# Patient Record
Sex: Female | Born: 1955 | Race: White | Hispanic: No | Smoking: Never smoker
Health system: Southern US, Community
[De-identification: ages and names within clinical notes are randomized; demographics above are authoritative.]

## PROBLEM LIST (undated history)

## (undated) DIAGNOSIS — R630 Anorexia: Secondary | ICD-10-CM

## (undated) DIAGNOSIS — F329 Major depressive disorder, single episode, unspecified: Secondary | ICD-10-CM

## (undated) DIAGNOSIS — M25561 Pain in right knee: Secondary | ICD-10-CM

## (undated) DIAGNOSIS — F32A Depression, unspecified: Secondary | ICD-10-CM

## (undated) DIAGNOSIS — F603 Borderline personality disorder: Secondary | ICD-10-CM

## (undated) DIAGNOSIS — H5347 Heteronymous bilateral field defects: Secondary | ICD-10-CM

## (undated) DIAGNOSIS — E785 Hyperlipidemia, unspecified: Secondary | ICD-10-CM

## (undated) DIAGNOSIS — S069XAA Unspecified intracranial injury with loss of consciousness status unknown, initial encounter: Secondary | ICD-10-CM

## (undated) DIAGNOSIS — M810 Age-related osteoporosis without current pathological fracture: Secondary | ICD-10-CM

## (undated) DIAGNOSIS — F502 Bulimia nervosa, unspecified: Secondary | ICD-10-CM

## (undated) DIAGNOSIS — G43109 Migraine with aura, not intractable, without status migrainosus: Secondary | ICD-10-CM

## (undated) DIAGNOSIS — H3552 Pigmentary retinal dystrophy: Secondary | ICD-10-CM

## (undated) DIAGNOSIS — F419 Anxiety disorder, unspecified: Secondary | ICD-10-CM

## (undated) DIAGNOSIS — D649 Anemia, unspecified: Secondary | ICD-10-CM

## (undated) HISTORY — DX: Major depressive disorder, single episode, unspecified: F32.9

## (undated) HISTORY — DX: Pigmentary retinal dystrophy: H35.52

## (undated) HISTORY — DX: Migraine with aura, not intractable, without status migrainosus: G43.109

## (undated) HISTORY — DX: Unspecified intracranial injury with loss of consciousness status unknown, initial encounter: S06.9XAA

## (undated) HISTORY — DX: Hyperlipidemia, unspecified: E78.5

## (undated) HISTORY — DX: Anxiety disorder, unspecified: F41.9

## (undated) HISTORY — DX: Pain in right knee: M25.561

## (undated) HISTORY — DX: Heteronymous bilateral field defects: H53.47

## (undated) HISTORY — DX: Anemia, unspecified: D64.9

## (undated) HISTORY — DX: Age-related osteoporosis without current pathological fracture: M81.0

## (undated) HISTORY — DX: Borderline personality disorder: F60.3

## (undated) HISTORY — DX: Depression, unspecified: F32.A

---

## 2011-08-18 ENCOUNTER — Ambulatory Visit: Payer: 59

## 2011-08-18 ENCOUNTER — Ambulatory Visit (INDEPENDENT_AMBULATORY_CARE_PROVIDER_SITE_OTHER): Payer: 59 | Admitting: Emergency Medicine

## 2011-08-18 VITALS — BP 116/70 | HR 58 | Temp 97.6°F | Resp 18 | Ht 63.25 in | Wt 105.0 lb

## 2011-08-18 DIAGNOSIS — Z8659 Personal history of other mental and behavioral disorders: Secondary | ICD-10-CM

## 2011-08-18 DIAGNOSIS — IMO0001 Reserved for inherently not codable concepts without codable children: Secondary | ICD-10-CM

## 2011-08-18 DIAGNOSIS — R350 Frequency of micturition: Secondary | ICD-10-CM

## 2011-08-18 DIAGNOSIS — M549 Dorsalgia, unspecified: Secondary | ICD-10-CM

## 2011-08-18 LAB — POCT URINALYSIS DIPSTICK
Bilirubin, UA: NEGATIVE
Glucose, UA: NEGATIVE
Ketones, UA: NEGATIVE
Leukocytes, UA: NEGATIVE
Nitrite, UA: NEGATIVE
Protein, UA: NEGATIVE
Spec Grav, UA: 1.01
Urobilinogen, UA: 0.2
pH, UA: 6

## 2011-08-18 LAB — POCT UA - MICROSCOPIC ONLY
Bacteria, U Microscopic: NEGATIVE
Casts, Ur, LPF, POC: NEGATIVE
Crystals, Ur, HPF, POC: NEGATIVE
Epithelial cells, urine per micros: NEGATIVE
Mucus, UA: 1.01
RBC, urine, microscopic: NEGATIVE
WBC, Ur, HPF, POC: NEGATIVE
Yeast, UA: NEGATIVE

## 2011-08-18 MED ORDER — CYCLOBENZAPRINE HCL 5 MG PO TABS: 5.0000 mg | ORAL_TABLET | Freq: Three times a day (TID) | ORAL | Status: AC | PRN

## 2011-08-18 MED ORDER — MELOXICAM 7.5 MG PO TABS: 7.5000 mg | ORAL_TABLET | Freq: Every day | ORAL | Status: AC

## 2011-08-18 NOTE — Progress Notes (Signed)
  Subjective:    Patient ID: [name], female    DOB: 26-Nov-1955, 56 y.o.   MRN: [id number]  Back Pain  Urinary Frequency  Associated symptoms include frequency.   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  Later that week, pt was working with a Psychologist, educational and felt a pop again and has been in severe pain in her back radiating down hips since.   Pt just drove twenty hours to [facility] to take care of her parents.     Review of Systems  Genitourinary: Positive for frequency.  Musculoskeletal: Positive for back pain.       Objective:   Physical Exam  Constitutional: She appears well-developed.  HENT:  Head: Normocephalic.  Eyes: Pupils are equal, round, and reactive to light.  Cardiovascular: Normal rate and regular rhythm.   Pulmonary/Chest: Effort normal and breath sounds normal.  Musculoskeletal:       There is tenderness just above the right SI joint. Straight leg raising is positive on the right at 90. Deep tendon reflexes are 3+ and symmetrical she does not have any focal weakness of the lower extremities. Sensation is intact .  Neurological: She is alert.   [facility] reading (PRIMARY) by  Dr. Cleta Alberts severe osteoporosis no fracture seen no compression fracture.  Results for orders placed in visit on 08/18/11  POCT UA - MICROSCOPIC ONLY      Component Value Range   WBC, Ur, HPF, POC neg     RBC, urine, microscopic neg     Bacteria, U Microscopic neg     Mucus, UA 1.010     Epithelial cells, urine per micros neg     Crystals, Ur, HPF, POC neg     Casts, Ur, LPF, POC neg     Yeast, UA neg    POCT URINALYSIS DIPSTICK      Component Value Range   Color, UA yel.low     Clarity, UA clear     Glucose, UA neg     Bilirubin, UA neg     Ketones, UA neg     Spec Grav, UA 1.010     Blood, UA trace     pH, UA 6.0     Protein, UA neg     Urobilinogen, UA 0.2     Nitrite, UA neg     Leukocytes, UA Negative           Assessment & Plan:    We'll treat as a LS strain. We'll treat with low-dose Flexeril and Mobic 7.5 a day recheck on next Saturday if she is still symptomatic consider MRI.

## 2011-08-21 ENCOUNTER — Telehealth: Payer: Self-pay

## 2011-08-21 DIAGNOSIS — M79604 Pain in right leg: Secondary | ICD-10-CM

## 2011-08-21 DIAGNOSIS — M545 Pain in right leg: Secondary | ICD-10-CM

## 2011-08-21 DIAGNOSIS — S335XXA Sprain of ligaments of lumbar spine, initial encounter: Secondary | ICD-10-CM

## 2011-08-21 DIAGNOSIS — M79605 Pain in left leg: Secondary | ICD-10-CM

## 2011-08-21 NOTE — Telephone Encounter (Signed)
Patient is still having back pain and would like to be referred for an MRI.  She does not want to wait for the follow up in one week,  Can this be done?

## 2011-08-21 NOTE — Telephone Encounter (Signed)
Saw Dr. Cleta Alberts for back pain last week.  Not getting any better and would like referral for an MRI.  Pt  [phone].

## 2011-08-22 NOTE — Telephone Encounter (Signed)
Pt is checking to make sure that the request for the referral of mri of lower back was taken care of.  Referrals sees that it has been reviewed by dr Cleta Alberts but nothing on our workqueue  Best number [phone]

## 2011-08-22 NOTE — Telephone Encounter (Signed)
Please have a referrals schedule an MRI of the LS-spine. Call patient in n you try and get this scheduled for her I need to see her after the MRI is done. Please discuss with patient and be sure she has no contraindications to having an MRI .

## 2011-08-22 NOTE — Telephone Encounter (Signed)
Ordered MRI LS spine per Dr. Cleta Alberts. LMOM notifying PT MRI has been ordered and we will be in touch with her about when it scheduled. Eileen Stanford

## 2011-08-27 ENCOUNTER — Ambulatory Visit
Admission: RE | Admit: 2011-08-27 | Discharge: 2011-08-27 | Disposition: A | Discharge status: Home / Self Care | Payer: 59 | Source: Ambulatory Visit | Attending: Emergency Medicine | Admitting: Emergency Medicine

## 2011-08-27 DIAGNOSIS — M79604 Pain in right leg: Secondary | ICD-10-CM

## 2011-08-27 DIAGNOSIS — S335XXA Sprain of ligaments of lumbar spine, initial encounter: Secondary | ICD-10-CM

## 2011-08-27 DIAGNOSIS — M545 Pain in right leg: Secondary | ICD-10-CM

## 2011-08-27 DIAGNOSIS — M79605 Pain in left leg: Secondary | ICD-10-CM

## 2011-09-14 ENCOUNTER — Telehealth: Payer: Self-pay

## 2011-09-14 NOTE — Telephone Encounter (Signed)
The patient called to request copies of X-rays done 08/18/11.  The patient would like these copies mailed to [address], [place], [place] [address] (her parent's address).  The patient needs these for appt on Tuesday 09/19/11.  Please call patient at [phone] with any questions and when request has been mailed.

## 2011-09-19 ENCOUNTER — Encounter: Payer: Self-pay | Admitting: Internal Medicine

## 2011-09-19 ENCOUNTER — Ambulatory Visit (INDEPENDENT_AMBULATORY_CARE_PROVIDER_SITE_OTHER): Payer: 59 | Admitting: Internal Medicine

## 2011-09-19 VITALS — BP 94/72 | HR 70 | Temp 98.2°F | Ht 63.25 in | Wt 106.0 lb

## 2011-09-19 DIAGNOSIS — Z0000 Encounter for general adult medical examination without abnormal findings: Secondary | ICD-10-CM

## 2011-09-19 DIAGNOSIS — R209 Unspecified disturbances of skin sensation: Secondary | ICD-10-CM

## 2011-09-19 DIAGNOSIS — IMO0001 Reserved for inherently not codable concepts without codable children: Secondary | ICD-10-CM | POA: Insufficient documentation

## 2011-09-19 DIAGNOSIS — Q068 Other specified congenital malformations of spinal cord: Secondary | ICD-10-CM

## 2011-09-19 DIAGNOSIS — R200 Anesthesia of skin: Secondary | ICD-10-CM

## 2011-09-19 DIAGNOSIS — R202 Paresthesia of skin: Secondary | ICD-10-CM | POA: Insufficient documentation

## 2011-09-19 DIAGNOSIS — H3552 Pigmentary retinal dystrophy: Secondary | ICD-10-CM | POA: Insufficient documentation

## 2011-09-19 DIAGNOSIS — M810 Age-related osteoporosis without current pathological fracture: Secondary | ICD-10-CM | POA: Insufficient documentation

## 2011-09-19 DIAGNOSIS — R5383 Other fatigue: Secondary | ICD-10-CM

## 2011-09-19 DIAGNOSIS — R5381 Other malaise: Secondary | ICD-10-CM

## 2011-09-19 DIAGNOSIS — R531 Weakness: Secondary | ICD-10-CM

## 2011-09-19 DIAGNOSIS — E610 Copper deficiency: Secondary | ICD-10-CM | POA: Insufficient documentation

## 2011-09-19 LAB — CBC WITH DIFFERENTIAL/PLATELET
Basophils Absolute: 0 10*3/uL (ref 0.0–0.1)
Basophils Relative: 0.7 % (ref 0.0–3.0)
Eosinophils Absolute: 0 10*3/uL (ref 0.0–0.7)
Eosinophils Relative: 1 % (ref 0.0–5.0)
HCT: 37.4 % (ref 36.0–46.0)
Hemoglobin: 12.5 g/dL (ref 12.0–15.0)
Lymphocytes Relative: 44.7 % (ref 12.0–46.0)
Lymphs Abs: 1.8 10*3/uL (ref 0.7–4.0)
MCHC: 33.5 g/dL (ref 30.0–36.0)
MCV: 93.9 fl (ref 78.0–100.0)
Monocytes Absolute: 0.3 10*3/uL (ref 0.1–1.0)
Monocytes Relative: 7.7 % (ref 3.0–12.0)
Neutro Abs: 1.8 10*3/uL (ref 1.4–7.7)
Neutrophils Relative %: 45.9 % (ref 43.0–77.0)
Platelets: 200 10*3/uL (ref 150.0–400.0)
RBC: 3.98 Mil/uL (ref 3.87–5.11)
RDW: 12.6 % (ref 11.5–14.6)
WBC: 4 10*3/uL — ABNORMAL LOW (ref 4.5–10.5)

## 2011-09-19 LAB — BASIC METABOLIC PANEL
BUN: 10 mg/dL (ref 6–23)
CO2: 31 mEq/L (ref 19–32)
Calcium: 9.2 mg/dL (ref 8.4–10.5)
Chloride: 93 mEq/L — ABNORMAL LOW (ref 96–112)
Creatinine, Ser: 0.7 mg/dL (ref 0.4–1.2)
GFR: 97.89 mL/min (ref >60.00)
Glucose, Bld: 92 mg/dL (ref 70–99)
Potassium: 4 mEq/L (ref 3.5–5.1)
Sodium: 131 mEq/L — ABNORMAL LOW (ref 135–145)

## 2011-09-19 LAB — HEPATIC FUNCTION PANEL
ALT: 20 U/L (ref 0–35)
AST: 26 U/L (ref 0–37)
Albumin: 4.3 g/dL (ref 3.5–5.2)
Alkaline Phosphatase: 46 U/L (ref 39–117)
Bilirubin, Direct: 0 mg/dL (ref 0.0–0.3)
Total Bilirubin: 0.5 mg/dL (ref 0.3–1.2)
Total Protein: 6.9 g/dL (ref 6.0–8.3)

## 2011-09-19 LAB — T4, FREE: Free T4: 0.71 ng/dL (ref 0.60–1.60)

## 2011-09-19 LAB — VITAMIN B12: Vitamin B-12: 583 pg/mL (ref 211–911)

## 2011-09-19 LAB — TSH: TSH: 1.05 u[IU]/mL (ref 0.35–5.50)

## 2011-09-19 NOTE — Assessment & Plan Note (Signed)
The patient has history of retinitis pigmentosa. Refer to a local ophthalmologist for followup.

## 2011-09-19 NOTE — Patient Instructions (Addendum)
Our office will contact you re: blood test resuls

## 2011-09-19 NOTE — Progress Notes (Signed)
Subjective:    Patient ID: [name], female    DOB: 03/04/1955, 56 y.o.   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 Patient moved to [place] in 2007 from [place]. Her parents live in [place] and are patient's of Dr. Cato Mulligan. Patient describes severe illness in 2007. Patient found to have myelodysplasia. She had associated pancytopenia, hypothyroidism and B12 deficiency. She was later referred to hematologist at [facility] clinic. Her symptoms were found to be secondary to severe copper deficiency. She underwent 6 weeks of copper infusion and her symptoms improved.  Over the last 3 weeks, patient has been experiencing somewhat similar symptoms. She describes a heaviness in her arms and legs and tingling in her hands, feet and tongue. She also complains of mental dullness.  Patient is a very fit. She works as a Museum/gallery conservator. She continues to struggle with eating disorder. However her weight is stable.  She is concerned that her physical stamina is getting worse.  She has history of retinitis pigmentosa. This was followed by an ophthalmologist in [place]. She requests referral to ophthalmologist in [place].  She has secondary osteoporosis due to eating disorder. She has been on Fosamax for years. Her last DEXA scan completed in March of 2013. She reports osteoporosis has gotten worse.   Review of Systems   Constitutional: Negative for activity change, appetite change and unexpected weight change.  Eyes: Negative for visual disturbance.  Respiratory: Negative for cough, chest tightness and shortness of breath.   Cardiovascular: Negative for chest pain.  Genitourinary: Negative for difficulty urinating.  Neurological: Negative for headaches.  Gastrointestinal: Negative for abdominal pain, heartburn melena or hematochezia Psych: Negative for depression or  anxiety  Preventative Health - she has never had colonoscopy. She is extremely reluctant to undergo sedation for procedure. She is also overdue for screening mammogram.    No past medical history on file.  History   Social History  . Marital Status: Single    Spouse Name: N/A    Number of Children: N/A  . Years of Education: N/A   Occupational History  . Not on file.   Social History Main Topics  . Smoking status: Never Smoker   . Smokeless tobacco: Not on file  . Alcohol Use: No  . Drug Use: No  . Sexually Active: Not on file   Other Topics Concern  . Not on file   Social History Narrative  . No narrative on file    No past surgical history on file.  No family history on file.  Allergies  Allergen Reactions  . Compazine (Prochlorperazine Edisylate)     Muscle tremors  . Epinephrine Palpitations    Current Outpatient Prescriptions on File Prior to Visit  Medication Sig Dispense Refill  . alendronate (FOSAMAX) 70 MG tablet Take 70 mg by mouth every 7 (seven) days. Take with a full glass of water on an empty stomach. Patient not sure of dose..      . ALPRAZolam (XANAX) 0.25 MG tablet Take 0.25 mg by mouth 2 (two) times daily.      . citalopram (CELEXA) 40 MG tablet Take 40 mg by mouth daily.      Marland Kitchen L-Methylfolate (DEPLIN PO) Take by mouth. Does not know the dose      . meloxicam (MOBIC) 7.5 MG tablet Take 1 tablet (7.5 mg total) by mouth daily.  30 tablet  0  BP 94/72  Pulse 70  Temp 98.2 F (36.8 C) (Oral)  Ht 5' 3.25" (1.607 m)  Wt 106 lb (48.081 kg)  BMI 18.63 kg/m2  LMP 02/18/2004       Objective:   Physical Exam  Constitutional: She is oriented to person, place, and time.       Thin, pleasant 56 year old white female  HENT:  Head: Normocephalic and atraumatic.  Right Ear: External ear normal.  Left Ear: External ear normal.  Mouth/Throat: Oropharynx is clear and moist.  Eyes: Conjunctivae and EOM are normal. Pupils are equal, round, and  reactive to light.  Neck: Neck supple.       No carotid bruit  Cardiovascular: Normal rate, regular rhythm, normal heart sounds and intact distal pulses.   No murmur heard. Pulmonary/Chest: Effort normal and breath sounds normal. She has no wheezes.  Abdominal: Soft. She exhibits no mass. There is tenderness.  Musculoskeletal: She exhibits no edema.  Lymphadenopathy:    She has no cervical adenopathy.  Neurological: She is alert and oriented to person, place, and time. She displays normal reflexes. She exhibits normal muscle tone.       Muscle strength is 5 out of 5 upper and lower extremities.  Patient able to perform multiple squats without difficulty  Skin: Skin is warm and dry.  Psychiatric: She has a normal mood and affect. Her behavior is normal.          Assessment & Plan:

## 2011-09-19 NOTE — Assessment & Plan Note (Signed)
Patient reports severe osteoporosis secondary to eating disorder. Her last DEXA scan was in March of 2013. She is currently taking Fosamax orally. We discussed possibly using Prolia in the future.

## 2011-09-19 NOTE — Assessment & Plan Note (Signed)
56 year old white female with history of severe myelodysplasia secondary competitions he complains of numbness and tingling and generalized weakness. She had similar symptoms when she was first diagnosed with myelodysplasia and 2007. Repeat CBC differential. Check thyroid studies and B12 levels

## 2011-09-20 LAB — VITAMIN D 25 HYDROXY (VIT D DEFICIENCY, FRACTURES): Vit D, 25-Hydroxy: 31 ng/mL (ref 30–89)

## 2011-09-21 ENCOUNTER — Other Ambulatory Visit: Payer: Self-pay | Admitting: Internal Medicine

## 2011-09-21 ENCOUNTER — Telehealth: Payer: Self-pay | Admitting: Internal Medicine

## 2011-09-21 DIAGNOSIS — Z0000 Encounter for general adult medical examination without abnormal findings: Secondary | ICD-10-CM

## 2011-09-21 NOTE — Telephone Encounter (Signed)
Pt would like blood work results °

## 2011-09-21 NOTE — Telephone Encounter (Signed)
See result note.  

## 2011-09-29 ENCOUNTER — Other Ambulatory Visit (INDEPENDENT_AMBULATORY_CARE_PROVIDER_SITE_OTHER): Payer: 59

## 2011-09-29 DIAGNOSIS — E871 Hypo-osmolality and hyponatremia: Secondary | ICD-10-CM

## 2011-09-29 LAB — OSMOLALITY, URINE: Osmolality, Ur: 232 mOsm/kg — ABNORMAL LOW (ref 390–1090)

## 2011-09-29 LAB — OSMOLALITY: Osmolality: 271 mOsm/kg — ABNORMAL LOW (ref 275–300)

## 2011-10-01 LAB — BASIC METABOLIC PANEL
BUN: 10 mg/dL (ref 6–23)
CO2: 31 mEq/L (ref 19–32)
Calcium: 9.4 mg/dL (ref 8.4–10.5)
Chloride: 95 mEq/L — ABNORMAL LOW (ref 96–112)
Creat: 0.68 mg/dL (ref 0.50–1.10)
Glucose, Bld: 72 mg/dL (ref 70–99)
Potassium: 4.3 mEq/L (ref 3.5–5.3)
Sodium: 131 mEq/L — ABNORMAL LOW (ref 135–145)

## 2011-10-02 LAB — FECAL OCCULT BLOOD, IMMUNOCHEMICAL: Fecal Occult Bld: NEGATIVE

## 2011-10-02 NOTE — Addendum Note (Signed)
Addended by: Rita Ohara R on: 10/02/2011 09:18 AM   Modules accepted: Orders

## 2011-10-03 LAB — SODIUM, URINE, RANDOM: Sodium, Ur: 21 mEq/L

## 2011-10-03 LAB — POTASSIUM, URINE, RANDOM: Potassium Urine: 28 mEq/L

## 2011-10-04 ENCOUNTER — Ambulatory Visit: Payer: 59 | Admitting: Internal Medicine

## 2011-10-05 ENCOUNTER — Ambulatory Visit: Payer: 59

## 2011-10-10 ENCOUNTER — Ambulatory Visit: Payer: 59 | Admitting: Internal Medicine

## 2011-10-13 ENCOUNTER — Telehealth: Payer: Self-pay | Admitting: Family Medicine

## 2011-10-13 ENCOUNTER — Ambulatory Visit: Payer: 59

## 2011-10-13 NOTE — Telephone Encounter (Signed)
We received notification that this pt did not complete the chest films ordered 09/29/11.

## 2011-11-13 ENCOUNTER — Telehealth: Payer: Self-pay | Admitting: Internal Medicine

## 2011-11-13 DIAGNOSIS — E871 Hypo-osmolality and hyponatremia: Secondary | ICD-10-CM

## 2011-11-13 NOTE — Telephone Encounter (Signed)
Don't see anything in the office note.  Please advise

## 2011-11-13 NOTE — Telephone Encounter (Signed)
Referral order placed.

## 2011-11-13 NOTE — Telephone Encounter (Signed)
[name], please place order for nephrology consult - Dr. Hyman Hopes  Re: mild hyponatremia.

## 2011-11-13 NOTE — Telephone Encounter (Signed)
Pt called stating that she was suppose to have a referral to Nephrology but was never contacted. There is no referral in system. Pt requesting a referral to Nephrology

## 2011-12-11 ENCOUNTER — Ambulatory Visit (INDEPENDENT_AMBULATORY_CARE_PROVIDER_SITE_OTHER): Payer: 59 | Admitting: Internal Medicine

## 2011-12-11 ENCOUNTER — Encounter: Payer: Self-pay | Admitting: Internal Medicine

## 2011-12-11 VITALS — BP 102/76 | Temp 98.2°F | Wt 106.0 lb

## 2011-12-11 DIAGNOSIS — R1030 Lower abdominal pain, unspecified: Secondary | ICD-10-CM

## 2011-12-11 DIAGNOSIS — R109 Unspecified abdominal pain: Secondary | ICD-10-CM

## 2011-12-11 MED ORDER — HYOSCYAMINE SULFATE 0.125 MG PO TABS: 0.1250 mg | ORAL_TABLET | ORAL | Status: DC | PRN

## 2011-12-11 NOTE — Patient Instructions (Addendum)
Our office will contact you re: referral to Dr. Leone Payor Please call our office if your symptoms do not improve or gets worse.

## 2011-12-11 NOTE — Progress Notes (Signed)
  Subjective:    Patient ID: [name], female    DOB: 09/26/1955, 56 y.o.   MRN: [id number]  HPI  56 year old white female complains of intermittent lower abdominal pain over the last 3-4 days. Patient describes a cramping sensation that radiates to her low back. Patient reports it feels like her previous menstrual cramps. She has also noted urinary frequency but denies dysuria or malodorous urine. She denies fever or chills.  Last night, patient felt like her lower abdomen was significantly bloated. Patient reports this may be related to her history of eating disorder. She consumes 6 heads of lettuce per day. She denies any constipation or diarrhea. She denies melena or blood in her stools.  Review of Systems See HPI  No past medical history on file.  History   Social History  . Marital Status: Single    Spouse Name: N/A    Number of Children: N/A  . Years of Education: N/A   Occupational History  . Not on file.   Social History Main Topics  . Smoking status: Never Smoker   . Smokeless tobacco: Not on file  . Alcohol Use: No  . Drug Use: No  . Sexually Active: Not on file   Other Topics Concern  . Not on file   Social History Narrative  . No narrative on file    No past surgical history on file.  No family history on file.  Allergies  Allergen Reactions  . Compazine (Prochlorperazine Edisylate)     Muscle tremors  . Epinephrine Palpitations    Current Outpatient Prescriptions on File Prior to Visit  Medication Sig Dispense Refill  . alendronate (FOSAMAX) 70 MG tablet Take 70 mg by mouth every 7 (seven) days. Take with a full glass of water on an empty stomach. Patient not sure of dose..      . ALPRAZolam (XANAX) 0.25 MG tablet Take 0.25 mg by mouth 2 (two) times daily.      . citalopram (CELEXA) 40 MG tablet Take 40 mg by mouth daily.      Marland Kitchen L-Methylfolate (DEPLIN PO) Take by mouth. Does not know the dose      . meloxicam (MOBIC) 7.5 MG tablet Take 1  tablet (7.5 mg total) by mouth daily.  30 tablet  0  . hyoscyamine (LEVSIN, ANASPAZ) 0.125 MG tablet Take 1 tablet (0.125 mg total) by mouth every 4 (four) hours as needed for cramping.  30 tablet  0    BP 102/76  Temp 98.2 F (36.8 C) (Oral)  Wt 106 lb (48.081 kg)  LMP 02/18/2004       Objective:   Physical Exam  Constitutional: She is oriented to person, place, and time.       Thin 56 y/o  Cardiovascular: Normal rate, regular rhythm and normal heart sounds.   Pulmonary/Chest: Effort normal and breath sounds normal. She has no wheezes.  Abdominal: Soft. Bowel sounds are normal.       Mild LLQ tenderness,  No mass  Neurological: She is alert and oriented to person, place, and time. No cranial nerve deficit.  Psychiatric: She has a normal mood and affect. Her behavior is normal.          Assessment & Plan:

## 2011-12-11 NOTE — Assessment & Plan Note (Signed)
56 year old white female complains of lower abdominal pain and bloating. She has urinary frequency but her urinalysis is completely normal. I suspect her symptoms are gastrointestinal in nature. She is 56 years old and has not had a colonoscopy. Refer to Dr. Leone Payor for further workup. Obtain CBCD, BMET, and sedimentation rate today. Use Levsin 0.125 mg every 4 hours as needed.  Patient advised to call office if symptoms persist or worsen.

## 2011-12-12 LAB — CBC WITH DIFFERENTIAL/PLATELET
Basophils Absolute: 0 10*3/uL (ref 0.0–0.1)
Basophils Relative: 0.7 % (ref 0.0–3.0)
Eosinophils Absolute: 0.1 10*3/uL (ref 0.0–0.7)
Eosinophils Relative: 1.3 % (ref 0.0–5.0)
HCT: 36 % (ref 36.0–46.0)
Hemoglobin: 12.1 g/dL (ref 12.0–15.0)
Lymphocytes Relative: 46.5 % — ABNORMAL HIGH (ref 12.0–46.0)
Lymphs Abs: 1.9 10*3/uL (ref 0.7–4.0)
MCHC: 33.7 g/dL (ref 30.0–36.0)
MCV: 92.7 fl (ref 78.0–100.0)
Monocytes Absolute: 0.4 10*3/uL (ref 0.1–1.0)
Monocytes Relative: 8.9 % (ref 3.0–12.0)
Neutro Abs: 1.8 10*3/uL (ref 1.4–7.7)
Neutrophils Relative %: 42.6 % — ABNORMAL LOW (ref 43.0–77.0)
Platelets: 194 10*3/uL (ref 150.0–400.0)
RBC: 3.89 Mil/uL (ref 3.87–5.11)
RDW: 12.2 % (ref 11.5–14.6)
WBC: 4.1 10*3/uL — ABNORMAL LOW (ref 4.5–10.5)

## 2011-12-12 LAB — BASIC METABOLIC PANEL
BUN: 15 mg/dL (ref 6–23)
CO2: 31 mEq/L (ref 19–32)
Calcium: 9.1 mg/dL (ref 8.4–10.5)
Chloride: 92 mEq/L — ABNORMAL LOW (ref 96–112)
Creatinine, Ser: 0.7 mg/dL (ref 0.4–1.2)
GFR: 92.92 mL/min (ref >60.00)
Glucose, Bld: 83 mg/dL (ref 70–99)
Potassium: 4.3 mEq/L (ref 3.5–5.1)
Sodium: 129 mEq/L — ABNORMAL LOW (ref 135–145)

## 2011-12-12 LAB — SEDIMENTATION RATE: Sed Rate: 10 mm/hr (ref 0–22)

## 2012-01-16 ENCOUNTER — Other Ambulatory Visit: Payer: Self-pay | Admitting: Internal Medicine

## 2012-01-30 ENCOUNTER — Encounter: Payer: Self-pay | Admitting: Internal Medicine

## 2012-03-11 ENCOUNTER — Other Ambulatory Visit: Payer: Self-pay | Admitting: Nephrology

## 2012-03-11 DIAGNOSIS — I959 Hypotension, unspecified: Secondary | ICD-10-CM

## 2012-03-18 ENCOUNTER — Other Ambulatory Visit: Payer: 59

## 2012-03-30 ENCOUNTER — Other Ambulatory Visit: Payer: Self-pay

## 2012-04-22 ENCOUNTER — Encounter: Payer: Self-pay | Admitting: Family Medicine

## 2012-04-22 ENCOUNTER — Ambulatory Visit (INDEPENDENT_AMBULATORY_CARE_PROVIDER_SITE_OTHER): Payer: 59 | Admitting: Family Medicine

## 2012-04-22 VITALS — BP 102/70 | HR 70 | Temp 99.0°F

## 2012-04-22 DIAGNOSIS — J019 Acute sinusitis, unspecified: Secondary | ICD-10-CM

## 2012-04-22 MED ORDER — AZITHROMYCIN 250 MG PO TABS: ORAL_TABLET | ORAL | Status: DC

## 2012-04-22 NOTE — Progress Notes (Signed)
  Subjective:    Patient ID: [name], female    DOB: [birth year], 57 y.o.   MRN: [id number]  HPI Here for 2 weeks of sinus pressure, HA, ear pressure, and PND. No fever or ST or cough.    Review of Systems  Constitutional: Negative.   HENT: Positive for congestion, postnasal drip and sinus pressure.   Eyes: Negative.   Respiratory: Negative.        Objective:   Physical Exam  Constitutional: She appears well-developed and well-nourished.  HENT:  Right Ear: External ear normal.  Left Ear: External ear normal.  Nose: Nose normal.  Mouth/Throat: Oropharynx is clear and moist. No oropharyngeal exudate.  Eyes: Conjunctivae are normal.  Pulmonary/Chest: Effort normal and breath sounds normal.  Lymphadenopathy:    She has no cervical adenopathy.          Assessment & Plan:  Add Mucinex D prn

## 2012-11-07 ENCOUNTER — Encounter: Payer: Self-pay | Admitting: Family

## 2012-11-07 ENCOUNTER — Ambulatory Visit (INDEPENDENT_AMBULATORY_CARE_PROVIDER_SITE_OTHER): Payer: 59 | Admitting: Family

## 2012-11-07 VITALS — BP 108/70 | HR 59 | Wt 110.0 lb

## 2012-11-07 DIAGNOSIS — Z8659 Personal history of other mental and behavioral disorders: Secondary | ICD-10-CM

## 2012-11-07 DIAGNOSIS — R1084 Generalized abdominal pain: Secondary | ICD-10-CM

## 2012-11-07 DIAGNOSIS — R110 Nausea: Secondary | ICD-10-CM

## 2012-11-07 LAB — POCT URINALYSIS DIPSTICK
Bilirubin, UA: NEGATIVE
Blood, UA: NEGATIVE
Glucose, UA: NEGATIVE
Ketones, UA: NEGATIVE
Leukocytes, UA: NEGATIVE
Nitrite, UA: NEGATIVE
Protein, UA: NEGATIVE
Spec Grav, UA: 1.01
Urobilinogen, UA: 0.2
pH, UA: 6

## 2012-11-07 LAB — CBC WITH DIFFERENTIAL/PLATELET
Basophils Absolute: 0 10*3/uL (ref 0.0–0.1)
Basophils Relative: 0.4 % (ref 0.0–3.0)
Eosinophils Absolute: 0.1 10*3/uL (ref 0.0–0.7)
Eosinophils Relative: 1.6 % (ref 0.0–5.0)
HCT: 38.3 % (ref 36.0–46.0)
Hemoglobin: 13.2 g/dL (ref 12.0–15.0)
Lymphocytes Relative: 27.4 % (ref 12.0–46.0)
Lymphs Abs: 1.4 10*3/uL (ref 0.7–4.0)
MCHC: 34.4 g/dL (ref 30.0–36.0)
MCV: 89.5 fl (ref 78.0–100.0)
Monocytes Absolute: 0.3 10*3/uL (ref 0.1–1.0)
Monocytes Relative: 5.8 % (ref 3.0–12.0)
Neutro Abs: 3.3 10*3/uL (ref 1.4–7.7)
Neutrophils Relative %: 64.8 % (ref 43.0–77.0)
Platelets: 225 10*3/uL (ref 150.0–400.0)
RBC: 4.28 Mil/uL (ref 3.87–5.11)
RDW: 12.9 % (ref 11.5–14.6)
WBC: 5.1 10*3/uL (ref 4.5–10.5)

## 2012-11-07 LAB — BASIC METABOLIC PANEL
BUN: 12 mg/dL (ref 6–23)
CO2: 32 mEq/L (ref 19–32)
Calcium: 9.6 mg/dL (ref 8.4–10.5)
Chloride: 99 mEq/L (ref 96–112)
Creatinine, Ser: 0.7 mg/dL (ref 0.4–1.2)
GFR: 94.2 mL/min (ref >60.00)
Glucose, Bld: 97 mg/dL (ref 70–99)
Potassium: 4.3 mEq/L (ref 3.5–5.1)
Sodium: 136 mEq/L (ref 135–145)

## 2012-11-07 LAB — HEPATIC FUNCTION PANEL
ALT: 25 U/L (ref 0–35)
AST: 23 U/L (ref 0–37)
Albumin: 4.3 g/dL (ref 3.5–5.2)
Alkaline Phosphatase: 55 U/L (ref 39–117)
Bilirubin, Direct: 0 mg/dL (ref 0.0–0.3)
Total Bilirubin: 0.4 mg/dL (ref 0.3–1.2)
Total Protein: 7.1 g/dL (ref 6.0–8.3)

## 2012-11-07 LAB — TSH: TSH: 1.21 u[IU]/mL (ref 0.35–5.50)

## 2012-11-07 NOTE — Patient Instructions (Addendum)
Abdominal Pain (Nonspecific)  Your exam might not show the exact reason you have abdominal pain. Since there are many different causes of abdominal pain, another checkup and more tests may be needed. It is very important to follow up for lasting (persistent) or worsening symptoms. A possible cause of abdominal pain in any person who still has his or her appendix is acute appendicitis. Appendicitis is often hard to diagnose. Normal blood tests, urine tests, ultrasound, and CT scans do not completely rule out early appendicitis or other causes of abdominal pain. Sometimes, only the changes that happen over time will allow appendicitis and other causes of abdominal pain to be determined. Other potential problems that may require surgery may also take time to become more apparent. Because of this, it is important that you follow all of the instructions below.  HOME CARE INSTRUCTIONS    Rest as much as possible.   Do not eat solid food until your pain is gone.   While adults or children have pain: A diet of water, weak decaffeinated tea, broth or bouillon, gelatin, oral rehydration solutions (ORS), frozen ice pops, or ice chips may be helpful.   When pain is gone in adults or children: Start a light diet (dry toast, crackers, applesauce, or white rice). Increase the diet slowly as long as it does not bother you. Eat no dairy products (including cheese and eggs) and no spicy, fatty, fried, or high-fiber foods.   Use no alcohol, caffeine, or cigarettes.   Take your regular medicines unless your caregiver told you not to.   Take any prescribed medicine as directed.   Only take over-the-counter or prescription medicines for pain, discomfort, or fever as directed by your caregiver. Do not give aspirin to children.  If your caregiver has given you a follow-up appointment, it is very important to keep that appointment. Not keeping the appointment could result in a permanent injury and/or lasting (chronic) pain  and/or disability. If there is any problem keeping the appointment, you must call to reschedule.   SEEK IMMEDIATE MEDICAL CARE IF:    Your pain is not gone in 24 hours.   Your pain becomes worse, changes location, or feels different.   You or your child has an oral temperature above 102 F (38.9 C), not controlled by medicine.   Your baby is older than 3 months with a rectal temperature of 102 F (38.9 C) or higher.   Your baby is 3 months old or younger with a rectal temperature of 100.4 F (38 C) or higher.   You have shaking chills.   You keep throwing up (vomiting) or cannot drink liquids.   There is blood in your vomit or you see blood in your bowel movements.   Your bowel movements become dark or black.   You have frequent bowel movements.   Your bowel movements stop (become blocked) or you cannot pass gas.   You have bloody, frequent, or painful urination.   You have yellow discoloration in the skin or whites of the eyes.   Your stomach becomes bloated or bigger.   You have dizziness or fainting.   You have chest or back pain.  MAKE SURE YOU:    Understand these instructions.   Will watch your condition.   Will get help right away if you are not doing well or get worse.  Document Released: 01/30/2005 Document Revised: 04/24/2011 Document Reviewed: 12/28/2008  [employer] Patient Information 2014 [employer].

## 2012-11-07 NOTE — Progress Notes (Signed)
Subjective:    Patient ID: [name], female    DOB: 24-Oct-1955, 57 y.o.   MRN: [id number]  HPI  57 year old white female, nonsmoker, patient of Dr. Marquis Lunch. is in today with complaints of right lower abdominal pain x2 weeks that she described as dull. Rates the pain as 7/10, no worse with movement. Has nausea but no vomiting. Had a similar episode one year ago but unknown etiology. Had a Pap smear 3 months ago that was normal. Patient has a history of eating disorder. She admits to eating 7 lettuces a day, 75 packs of splenda, and lots of chewing gum to maintain her shape. Reports eating less than 620 calories daily. 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 Reports that she's been doing very well and has not had any recent issues. Continues to see a therapist.  Review of Systems  Constitutional: Negative.   HENT: Negative.   Respiratory: Negative.   Cardiovascular: Negative.   Gastrointestinal: Positive for nausea and abdominal pain. Negative for diarrhea and constipation.  Genitourinary: Negative.  Negative for vaginal bleeding and vaginal discharge.  Musculoskeletal: Negative.   Skin: Negative.   Neurological: Negative.   Hematological: Negative.   Psychiatric/Behavioral: Negative.    History reviewed. No pertinent past medical history.  History   Social History  . Marital Status: Single    Spouse Name: N/A    Number of Children: N/A  . Years of Education: N/A   Occupational History  . Not on file.   Social History Main Topics  . Smoking status: Never Smoker   . Smokeless tobacco: Never Used  . Alcohol Use: No  . Drug Use: No  . Sexual Activity: Not on file   Other Topics Concern  . Not on file   Social History Narrative  . No narrative on file    History reviewed. No pertinent past surgical history.  No family history on file.  Allergies  Allergen Reactions  . Compazine [Prochlorperazine Edisylate]     Muscle tremors  . Epinephrine Palpitations     Current Outpatient Prescriptions on File Prior to Visit  Medication Sig Dispense Refill  . alendronate (FOSAMAX) 70 MG tablet Take 70 mg by mouth every 7 (seven) days. Take with a full glass of water on an empty stomach. Patient not sure of dose..      . ALPRAZolam (XANAX) 0.25 MG tablet Take 0.25 mg by mouth 2 (two) times daily.      Marland Kitchen azithromycin (ZITHROMAX) 250 MG tablet As directed  6 tablet  0  . citalopram (CELEXA) 40 MG tablet Take 40 mg by mouth daily.      . hyoscyamine (LEVSIN, ANASPAZ) 0.125 MG tablet Take 1 tablet (0.125 mg total) by mouth every 4 (four) hours as needed for cramping.  30 tablet  0  . L-Methylfolate (DEPLIN PO) Take by mouth. Does not know the dose       No current facility-administered medications on file prior to visit.    BP 108/70  Pulse 59  Wt 110 lb (49.896 kg)  BMI 19.32 kg/m2  SpO2 98%  LMP 01/05/2006chart    Objective:   Physical Exam  Constitutional: She is oriented to person, place, and time. She appears well-developed and well-nourished.  HENT:  Right Ear: External ear normal.  Left Ear: External ear normal.  Nose: Nose normal.  Mouth/Throat: Oropharynx is clear and moist.  Neck: Normal range of motion. Neck supple.  Cardiovascular: Normal rate, regular rhythm and normal heart sounds.  Pulmonary/Chest: Effort normal and breath sounds normal.  Abdominal: Soft. Bowel sounds are normal. She exhibits no distension. There is no tenderness. There is no rebound and no guarding.  Musculoskeletal: Normal range of motion.  Neurological: She is alert and oriented to person, place, and time.  Skin: Skin is warm and dry.  Psychiatric: She has a normal mood and affect.          Assessment & Plan:  Assessment: 1. Generalized abdominal pain 2. Right lower quadrant abdominal pain 3. Nausea 4. History of eating disorder  Plan: Labs sent. Will notify patient pending results. I believe the underlying issue is stress and anxiety.

## 2012-11-11 ENCOUNTER — Telehealth: Payer: Self-pay | Admitting: Internal Medicine

## 2012-11-11 NOTE — Telephone Encounter (Signed)
Pt had blood work on 11-07-12. Pt saw NP. Pt needs blood work results today

## 2012-11-11 NOTE — Telephone Encounter (Signed)
Left message to advise pt labs normal, per [facility]

## 2012-12-19 ENCOUNTER — Other Ambulatory Visit: Payer: Self-pay

## 2013-02-12 ENCOUNTER — Ambulatory Visit: Payer: 59 | Admitting: *Deleted

## 2013-02-24 ENCOUNTER — Ambulatory Visit (INDEPENDENT_AMBULATORY_CARE_PROVIDER_SITE_OTHER): Payer: 59 | Admitting: Internal Medicine

## 2013-02-24 DIAGNOSIS — Z23 Encounter for immunization: Secondary | ICD-10-CM

## 2013-04-11 ENCOUNTER — Ambulatory Visit (INDEPENDENT_AMBULATORY_CARE_PROVIDER_SITE_OTHER): Payer: 59 | Admitting: Family

## 2013-04-11 ENCOUNTER — Encounter: Payer: Self-pay | Admitting: Family

## 2013-04-11 VITALS — BP 90/70 | HR 63 | Temp 98.3°F

## 2013-04-11 DIAGNOSIS — J069 Acute upper respiratory infection, unspecified: Secondary | ICD-10-CM

## 2013-04-11 MED ORDER — FLUTICASONE PROPIONATE 50 MCG/ACT NA SUSP: 2.0000 | Freq: Every day | NASAL | Status: DC

## 2013-04-11 MED ORDER — AZITHROMYCIN 250 MG PO TABS: ORAL_TABLET | ORAL | Status: DC

## 2013-04-11 NOTE — Patient Instructions (Signed)

## 2013-04-11 NOTE — Progress Notes (Signed)
Pre visit review using our clinic review tool, if applicable. No additional management support is needed unless otherwise documented below in the visit note. 

## 2013-04-14 NOTE — Progress Notes (Signed)
Subjective:    Patient ID: [name], female    DOB: [birth year], 58 y.o.   MRN: [id number]  Cough Associated symptoms include a fever and postnasal drip. Pertinent negatives include no shortness of breath or wheezing.    58 year old white female, nonsmoker who presents today with complaints of nausea, congestion, low-grade fever x1 week without relief. Has not taken any over-the-counter medications..  Review of Systems  Constitutional: Positive for fever.  HENT: Positive for congestion and postnasal drip. Negative for sinus pressure.   Respiratory: Positive for cough. Negative for shortness of breath and wheezing.   Cardiovascular: Negative.   Gastrointestinal: Positive for nausea.  Musculoskeletal: Negative.   Skin: Negative.   Allergic/Immunologic: Negative.   Psychiatric/Behavioral: Negative.    History reviewed. No pertinent past medical history.  History   Social History  . Marital Status: Single    Spouse Name: N/A    Number of Children: N/A  . Years of Education: N/A   Occupational History  . Not on file.   Social History Main Topics  . Smoking status: Never Smoker   . Smokeless tobacco: Never Used  . Alcohol Use: No  . Drug Use: No  . Sexual Activity: Not on file   Other Topics Concern  . Not on file   Social History Narrative  . No narrative on file    History reviewed. No pertinent past surgical history.  No family history on file.  Allergies  Allergen Reactions  . Compazine [Prochlorperazine Edisylate]     Muscle tremors  . Epinephrine Palpitations    Current Outpatient Prescriptions on File Prior to Visit  Medication Sig Dispense Refill  . ALPRAZolam (XANAX) 0.25 MG tablet Take 0.25 mg by mouth 2 (two) times daily.      . citalopram (CELEXA) 40 MG tablet Take 40 mg by mouth daily.      Marland Kitchen L-Methylfolate (DEPLIN PO) Take by mouth. Does not know the dose      . alendronate (FOSAMAX) 70 MG tablet Take 70 mg by mouth every 7 (seven)  days. Take with a full glass of water on an empty stomach. Patient not sure of dose..      . hyoscyamine (LEVSIN, ANASPAZ) 0.125 MG tablet Take 1 tablet (0.125 mg total) by mouth every 4 (four) hours as needed for cramping.  30 tablet  0   No current facility-administered medications on file prior to visit.    BP 90/70  Pulse 63  Temp(Src) 98.3 F (36.8 C)  LMP 01/05/2006chart    Objective:   Physical Exam  Constitutional: She is oriented to person, place, and time. She appears well-developed and well-nourished.  HENT:  Right Ear: External ear normal.  Left Ear: External ear normal.  Mouth/Throat: Oropharynx is clear and moist.  Nasal turbinates red and boggy  Neck: Normal range of motion. Neck supple.  Cardiovascular: Normal rate, regular rhythm and normal heart sounds.   Pulmonary/Chest: Effort normal and breath sounds normal.  Neurological: She is alert and oriented to person, place, and time.  Skin: Skin is warm and dry.  Psychiatric: She has a normal mood and affect.          Assessment & Plan:  Nitro was seen today for nasal congestion, cough and nausea.  Diagnoses and associated orders for this visit:  Acute upper respiratory infections of unspecified site  Other Orders - fluticasone (FLONASE) 50 MCG/ACT nasal spray; Place 2 sprays into both nostrils daily. - azithromycin (ZITHROMAX) 250 MG tablet; As  directed    advised only fill the antibiotic if necessary over the weekend. Recheck as scheduled and as needed.

## 2013-05-13 ENCOUNTER — Encounter: Payer: Self-pay | Admitting: Family

## 2013-05-13 ENCOUNTER — Ambulatory Visit (INDEPENDENT_AMBULATORY_CARE_PROVIDER_SITE_OTHER): Payer: 59 | Admitting: Family

## 2013-05-13 ENCOUNTER — Ambulatory Visit (INDEPENDENT_AMBULATORY_CARE_PROVIDER_SITE_OTHER)
Admission: RE | Admit: 2013-05-13 | Discharge: 2013-05-13 | Disposition: A | Discharge status: Home / Self Care | Payer: 59 | Source: Ambulatory Visit | Attending: Family | Admitting: Family

## 2013-05-13 VITALS — BP 98/64 | HR 74

## 2013-05-13 DIAGNOSIS — R071 Chest pain on breathing: Secondary | ICD-10-CM

## 2013-05-13 DIAGNOSIS — R0789 Other chest pain: Secondary | ICD-10-CM

## 2013-05-13 MED ORDER — CYCLOBENZAPRINE HCL 5 MG PO TABS: 5.0000 mg | ORAL_TABLET | Freq: Three times a day (TID) | ORAL | Status: DC | PRN

## 2013-05-13 NOTE — Patient Instructions (Signed)

## 2013-05-13 NOTE — Progress Notes (Signed)
Subjective:    Patient ID: [name], female    DOB: 1955/12/06, 58 y.o.   MRN: [id number]  HPI 58 year old white female, nonsmoker, malnourished female, is in today with c/o left chest wall pain after lifting weights at the gym 60 days ago. Reports having pain 10/10. Tylenol extra strength helps temporarily. Worse with movement. Mild swelling.    Review of Systems  Constitutional: Negative.   Respiratory: Negative.   Cardiovascular: Negative.   Gastrointestinal: Negative.   Endocrine: Negative.   Musculoskeletal: Positive for arthralgias.       Left chest wall   Skin: Negative.   Allergic/Immunologic: Negative.   Neurological: Negative.   Psychiatric/Behavioral: Negative.    History reviewed. No pertinent past medical history.  History   Social History  . Marital Status: Single    Spouse Name: N/A    Number of Children: N/A  . Years of Education: N/A   Occupational History  . Not on file.   Social History Main Topics  . Smoking status: Never Smoker   . Smokeless tobacco: Never Used  . Alcohol Use: No  . Drug Use: No  . Sexual Activity: Not on file   Other Topics Concern  . Not on file   Social History Narrative  . No narrative on file    History reviewed. No pertinent past surgical history.  No family history on file.  Allergies  Allergen Reactions  . Compazine [Prochlorperazine Edisylate]     Muscle tremors  . Epinephrine Palpitations    Current Outpatient Prescriptions on File Prior to Visit  Medication Sig Dispense Refill  . alendronate (FOSAMAX) 70 MG tablet Take 70 mg by mouth every 7 (seven) days. Take with a full glass of water on an empty stomach. Patient not sure of dose..      . ALPRAZolam (XANAX) 0.25 MG tablet Take 0.25 mg by mouth 2 (two) times daily.      . citalopram (CELEXA) 40 MG tablet Take 40 mg by mouth daily.      . fluticasone (FLONASE) 50 MCG/ACT nasal spray Place 2 sprays into both nostrils daily.  16 g  6  .  hyoscyamine (LEVSIN, ANASPAZ) 0.125 MG tablet Take 1 tablet (0.125 mg total) by mouth every 4 (four) hours as needed for cramping.  30 tablet  0  . L-Methylfolate (DEPLIN PO) Take by mouth. Does not know the dose       No current facility-administered medications on file prior to visit.    BP 98/64  Pulse 74  SpO2 97%  LMP 01/05/2006chart    Objective:   Physical Exam  Constitutional: She is oriented to person, place, and time. She appears well-developed and well-nourished.  Neck: Normal range of motion. Neck supple.  Cardiovascular: Normal rate, regular rhythm and normal heart sounds.   Pulmonary/Chest: Effort normal and breath sounds normal.  Abdominal: Soft. Bowel sounds are normal.  Musculoskeletal: She exhibits tenderness. She exhibits no edema.       Arms: Neurological: She is alert and oriented to person, place, and time.  Skin: Skin is warm and dry.  Psychiatric: She has a normal mood and affect.          Assessment & Plan:  [name] was seen today for no specified reason.  Diagnoses and associated orders for this visit:  Left-sided chest wall pain - DG Ribs Unilateral Left; Future  Other Orders - cyclobenzaprine (FLEXERIL) 5 MG tablet; Take 1 tablet (5 mg total) by mouth 3 (  three) times daily as needed for muscle spasms.   Aleve with food. Flexeril as needed. Warned of drowsiness. Call the office with any questions or concerns.

## 2013-05-13 NOTE — Progress Notes (Signed)
Pre visit review using our clinic review tool, if applicable. No additional management support is needed unless otherwise documented below in the visit note. 

## 2013-08-06 ENCOUNTER — Telehealth: Payer: Self-pay | Admitting: Internal Medicine

## 2013-08-06 NOTE — Telephone Encounter (Signed)
Received telephone call from patient's ophthalmologist Dr. Willow Ora.  Patient has finding of retinitis pigmentosa. 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 Patient contacted by nursing staff and appointment made on 08/11/2013 for further evaluation.

## 2013-08-11 ENCOUNTER — Ambulatory Visit (INDEPENDENT_AMBULATORY_CARE_PROVIDER_SITE_OTHER): Payer: 59 | Admitting: Internal Medicine

## 2013-08-11 ENCOUNTER — Encounter: Payer: Self-pay | Admitting: Internal Medicine

## 2013-08-11 VITALS — BP 100/74 | Temp 98.4°F

## 2013-08-11 DIAGNOSIS — H5347 Heteronymous bilateral field defects: Secondary | ICD-10-CM | POA: Insufficient documentation

## 2013-08-11 NOTE — Patient Instructions (Signed)
Our office will contact you regarding arranging MRI of Brain. Please return to our lab at 8 AM for blood tests.

## 2013-08-11 NOTE — Progress Notes (Signed)
Subjective:    Patient ID: [name], female    DOB: 01/15/1956, 58 y.o.   MRN: [id number]  HPI  58 year old white female with a history of retinitis pigmentosa referred by her ophthalmologist Dr. Willow Ora due to incidental finding of bitemporal hemianopsia on recent field of vision testing.  Patient reports she has been followed for her retinitis pigmentosa for years. She was previously followed by ophthalmologist in [place]. She has been steadily losing her peripheral vision. This is the first time she was alerted to this potential problem of bitemporal hemianopsia.  Otherwise patient does not report any symptoms suspicious for functioning pituitary tumor. She denies any galactorrhea. She denies any headache. Patient also denies cold intolerance or inability to regulate her body temperature. She denies unusual fatigue.  Patient has been menopausal since age 58.  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 Her thyroid function returned to normal once her copper levels were repleted.   Review of Systems Negative for unusual fatigue, negative for headaches   Past Medical History  Diagnosis Date  . Retinitis pigmentosa     History   Social History  . Marital Status: Single    Spouse Name: N/A    Number of Children: N/A  . Years of Education: N/A   Occupational History  . Not on file.   Social History Main Topics  . Smoking status: Never Smoker   . Smokeless tobacco: Never Used  . Alcohol Use: No  . Drug Use: No  . Sexual Activity: Not on file   Other Topics Concern  . Not on file   Social History Narrative  . No narrative on file    No past surgical history on file.  No family history on file.  Allergies  Allergen Reactions  . Compazine [Prochlorperazine Edisylate]     Muscle tremors  . Epinephrine Palpitations    Current Outpatient Prescriptions on File Prior to Visit  Medication Sig Dispense Refill   . alendronate (FOSAMAX) 70 MG tablet Take 70 mg by mouth every 7 (seven) days. Take with a full glass of water on an empty stomach. Patient not sure of dose..      . ALPRAZolam (XANAX) 0.25 MG tablet Take 0.25 mg by mouth 2 (two) times daily.      . citalopram (CELEXA) 40 MG tablet Take 40 mg by mouth daily.      . cyclobenzaprine (FLEXERIL) 5 MG tablet Take 1 tablet (5 mg total) by mouth 3 (three) times daily as needed for muscle spasms.  30 tablet  0  . fluticasone (FLONASE) 50 MCG/ACT nasal spray Place 2 sprays into both nostrils daily.  16 g  6  . hyoscyamine (LEVSIN, ANASPAZ) 0.125 MG tablet Take 1 tablet (0.125 mg total) by mouth every 4 (four) hours as needed for cramping.  30 tablet  0  . L-Methylfolate (DEPLIN PO) Take by mouth. Does not know the dose       No current facility-administered medications on file prior to visit.    BP 100/74  Temp(Src) 98.4 F (36.9 C) (Oral)  LMP 02/18/2004     Objective:   Physical Exam  Constitutional: She is oriented to person, place, and time. She appears well-developed and well-nourished. No distress.  HENT:  Head: Normocephalic and atraumatic.  Right Ear: External ear normal.  Left Ear: External ear normal.  Mouth/Throat: Oropharynx is clear and moist.  Eyes: Conjunctivae and EOM are normal. Pupils are equal, round, and  reactive to light.  Neck: Neck supple.  Cardiovascular: Normal rate, regular rhythm and normal heart sounds.   No murmur heard. Pulmonary/Chest: Effort normal and breath sounds normal. She has no wheezes.  Musculoskeletal: She exhibits no edema.  Lymphadenopathy:    She has no cervical adenopathy.  Neurological: She is alert and oriented to person, place, and time. She displays normal reflexes. No cranial nerve deficit. She exhibits normal muscle tone. Coordination normal.  Non focal.  Negative pronator drift.  Negative for cerebellar signs. Her gait is normal. Negative Romberg  Skin: Skin is warm and dry. No rash  noted.  Psychiatric: She has a normal mood and affect. Her behavior is normal.          Assessment & Plan:

## 2013-08-11 NOTE — Progress Notes (Signed)
Pre visit review using our clinic review tool, if applicable. No additional management support is needed unless otherwise documented below in the visit note. 

## 2013-08-11 NOTE — Assessment & Plan Note (Signed)
58 year old white female referred by her ophthalmologist for finding of bitemporal hemianopsia. There is concern for possible pituitary tumor. Clinically patient seems to be asymptomatic. Obtain MRI with and without contrast.  Also obtain the following blood tests: TSH, free T4, free T3, prolactin level, 8 AM Cortisol level, and insulin growth factor level.  If pituitary adenoma found, we discussed referral to endocrinologist and neurosurgeon.

## 2013-08-12 ENCOUNTER — Other Ambulatory Visit (INDEPENDENT_AMBULATORY_CARE_PROVIDER_SITE_OTHER): Payer: 59

## 2013-08-12 DIAGNOSIS — H5347 Heteronymous bilateral field defects: Secondary | ICD-10-CM

## 2013-08-12 LAB — BASIC METABOLIC PANEL
BUN: 13 mg/dL (ref 6–23)
CO2: 31 mEq/L (ref 19–32)
Calcium: 9.8 mg/dL (ref 8.4–10.5)
Chloride: 93 mEq/L — ABNORMAL LOW (ref 96–112)
Creatinine, Ser: 0.7 mg/dL (ref 0.4–1.2)
GFR: 90.86 mL/min (ref >60.00)
Glucose, Bld: 97 mg/dL (ref 70–99)
Potassium: 4.7 mEq/L (ref 3.5–5.1)
Sodium: 133 mEq/L — ABNORMAL LOW (ref 135–145)

## 2013-08-12 LAB — T3, FREE: T3, Free: 3 pg/mL (ref 2.3–4.2)

## 2013-08-12 LAB — T4, FREE: Free T4: 0.69 ng/dL (ref 0.60–1.60)

## 2013-08-12 LAB — TSH: TSH: 1.15 u[IU]/mL (ref 0.35–4.50)

## 2013-08-13 LAB — INSULIN-LIKE GROWTH FACTOR: Somatomedin (IGF-I): 125 ng/mL (ref 31–179)

## 2013-08-13 LAB — PROLACTIN: Prolactin: 5.7 ng/mL

## 2013-08-21 ENCOUNTER — Other Ambulatory Visit: Payer: 59

## 2013-10-13 ENCOUNTER — Ambulatory Visit (INDEPENDENT_AMBULATORY_CARE_PROVIDER_SITE_OTHER): Payer: 59 | Admitting: Family Medicine

## 2013-10-13 ENCOUNTER — Encounter: Payer: Self-pay | Admitting: Family Medicine

## 2013-10-13 VITALS — Ht 63.25 in

## 2013-10-13 DIAGNOSIS — F5000 Anorexia nervosa, unspecified: Secondary | ICD-10-CM

## 2013-10-13 NOTE — Patient Instructions (Addendum)
-   Please get your vitamin D level checked.   - Neuropathy:  May be helped by consumption of alpha-linolenic acid.   - Synopsis of what we discussed today:  - Your tingling may be a type of neuropathy, possibly related to lack of essential fats in your diet.    - I can think of no better solution to your "brain fog" than to increase your calories.    - Many patients with long-standing food restriction actually go into hypermetabolism with re-feeding, so do not gain weight right away.    - Early weight gain with re-feeding is usually related to accumulation of fluids rather than fat.  - No amount of protein will allow muscle protein synthesis as long as the body is in an energy deficit.    - You have beaten the odds up to this point, but you are running out of time to make meaningful changes in your diet.  If you continue to eat so little and exercise so much, you will crash at some point.  It's     not a matter of if, but of when.      Please give thought to what we've discussed today, and write down questions as well as obstacles you see to following the recommendation to increase your food intake.    - Give me a call/email if you want to follow up:  [email or web address].

## 2013-10-13 NOTE — Progress Notes (Signed)
Medical Nutrition Therapy:  Appt start time: 0900 end time:  0915. PCP:   Drema Pry, MD Therapist:  Tommie Sams, MD [phone]) - Psychiatrist in [place]; [name] is not interested in pursuing therapy at this time.   Other medical team members: none  Assessment:  Primary concerns today: eating disorder.  [name] was referred by Dr. Tommie Sams, her psychiatrist in [place], who feels her current symptoms (see below) are likely malnutrition-related.   Weight:    Pt declined weight check  Height:    63.25"  Expected body weight: 116 lb  % expected body weight:  ---  Usual eating pattern includes 3 meals and 2 snacks per day. Frequent foods include unsweetened tea, Kashi 7-grain wheat cereal, Chobani plain Grk yog, egg whites, 1/2 apple, [employer] tuna, [employer] sliced [place].  Avoided foods include all others except occasionally broccoli (only when she is at her parents'). 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   History of eating disorder: Started 05-03-71:  Felt fat on stomach; dr. gave her 1000-kcal diet; went from 140 to 88 lb right before college.  Started bingeing, fluctuated between restrxn and bingeing, then started taking 50 laxatives a day.  Next several yrs, alternated between anorexia nervosa with compulsive exercise and other compensatory behaviors.  Abused lax's 1984-2011, several times a day.  Has had several in-pt treatments, including [place] (IOP in 2012), [place] [facility] in-pt, [employer]).  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 Self-injury:    Y; last time 5-6 yrs ago; frequent self-cutting in 5th and 6th grade. Stealing:    N Alcohol, drugs:   N Internet use:    N Programmer, systems or rituals:   Timing: snacks must be at certain time; Splenda pkts must be in certain direction; salads cut precisely; heat salads in microwave; eats out only at [employer] (rare); 100 pkts      of Splenda per day; 4-6 packs of breath mints/day; ~90 pcs gum/day; drinks little water; dinner usually takes a couple of hours. Over-exercise:   1-2 hours per day  Other Relevant History: Abuse:     N Suffered from frequent dissociation ages [age]; dropped out of school initially.  Promiscuous as adolescent; pgnt 73 X, 4 abortions, 1 daughter, who is living and dx'd with bipolar dis.   Changes in hair, skin, nails  since ED started:    N Dental health:     Y; dentures; chews gum constantly.   LMP w/out use of hormones: amenorheic much of adult life; osteoporotic now.   Reflux:     N Other GI complaints:    N Current symptoms:  Feels tingly, "electric"; decreased mental acuity, thoughts "jacked up," always cold, heart feels pounding, but rate is normal.  [name]'s main complaint is her diminished cognitive function.  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  It is the latter that most concerns her, as she feels incapable currently of writing a graduate-level paper.    24-hr recall: (Up at 4:45 AM; studies before work) B (7 AM)-   2 c Kashi cereal dry.  (200 kcal) Snk (12 AM)-   Tea with Splenda (6 Splenda in iced tea; 4 in hot tea); Chobani Grk yogurt (90 kcal)  L (12 PM)-  6 bags shredded lettuce, vinegar, Splenda  Snk (2 PM)-  Chobani Grk yogurt (90 kcal)  D (6 PM)-  4 heads of icebg lettuce, vinegar, Splenda (each bowl heated ~90 sec), 6  egg whites, 6 oz tuna (120 kcal) Snk ( PM)-  none Typical day? Yes.      Progress Towards Goal(s):  No progress.   Nutritional Diagnosis:  NB-1.5 Disordered eating pattern As related to food and weight anxiety.  As evidenced by usual intake of <1000 kcal/day with 1-2 hrs high-intensity exercise/day.    Intervention:  Nutrition counseling.  Learning Readiness: Not ready; [name] said today's discuss (see Pt Instructions) makes her want to go home and abuse laxatives; she is that nervous about weight gain.  Not sure she can make dietary changes.    Handouts given during visit include:  AVS  Demonstrated degree of understanding via:  Teach Back   Monitoring/Evaluation:  Dietary intake, exercise, and body weight in 5 week(s).

## 2013-11-18 ENCOUNTER — Ambulatory Visit: Payer: 59 | Admitting: Family Medicine

## 2014-03-13 DIAGNOSIS — F325 Major depressive disorder, single episode, in full remission: Secondary | ICD-10-CM | POA: Insufficient documentation

## 2014-03-13 DIAGNOSIS — F411 Generalized anxiety disorder: Secondary | ICD-10-CM | POA: Insufficient documentation

## 2014-07-16 ENCOUNTER — Ambulatory Visit (INDEPENDENT_AMBULATORY_CARE_PROVIDER_SITE_OTHER): Payer: Self-pay | Admitting: Adult Health

## 2014-07-16 ENCOUNTER — Encounter: Payer: Self-pay | Admitting: Adult Health

## 2014-07-16 VITALS — BP 98/62 | HR 67 | Temp 98.9°F | Ht 63.25 in | Wt 107.0 lb

## 2014-07-16 DIAGNOSIS — J069 Acute upper respiratory infection, unspecified: Secondary | ICD-10-CM

## 2014-07-16 NOTE — Progress Notes (Signed)
Pre visit review using our clinic review tool, if applicable. No additional management support is needed unless otherwise documented below in the visit note. 

## 2014-07-16 NOTE — Progress Notes (Signed)
Subjective:    Patient ID: [name], female    DOB: Sep 20, 1955, 59 y.o.   MRN: [id number]  HPI  Patient presents to the office today for cough( productive), chest congestion, sore throat, left eye itching and running, ears clogged up and fatigue x 2 or 3 days.  Denies fevers. No N/V/D.   No sick contacts.    Review of Systems  Constitutional: Positive for activity change and fatigue. Negative for fever and chills.  HENT: Positive for ear pain, rhinorrhea and sore throat. Negative for congestion, ear discharge, facial swelling, postnasal drip, sinus pressure and tinnitus.   Eyes: Positive for discharge. Negative for pain, redness and itching.  Respiratory: Positive for cough. Negative for chest tightness, shortness of breath and wheezing.   Cardiovascular: Negative for chest pain, palpitations and leg swelling.  Musculoskeletal: Negative for myalgias, back pain, neck pain and neck stiffness.  Hematological: Negative for adenopathy.   Past Medical History  Diagnosis Date  . Retinitis pigmentosa     History   Social History  . Marital Status: Single    Spouse Name: N/A  . Number of Children: N/A  . Years of Education: N/A   Occupational History  . Not on file.   Social History Main Topics  . Smoking status: Never Smoker   . Smokeless tobacco: Never Used  . Alcohol Use: No  . Drug Use: No  . Sexual Activity: Not on file   Other Topics Concern  . Not on file   Social History Narrative    No past surgical history on file.  No family history on file.  Allergies  Allergen Reactions  . Compazine [Prochlorperazine Edisylate]     Muscle tremors  . Cephalexin Rash  . Epinephrine Palpitations    Current Outpatient Prescriptions on File Prior to Visit  Medication Sig Dispense Refill  . ALPRAZolam (XANAX) 0.25 MG tablet Take 0.25 mg by mouth 2 (two) times daily.    . calcium carbonate 200 MG capsule Take 250 mg by mouth 2 (two) times daily with a meal.      . citalopram (CELEXA) 40 MG tablet Take 40 mg by mouth daily.    Marland Kitchen L-Methylfolate (DEPLIN PO) Take by mouth. Does not know the dose    . Multiple Vitamins-Minerals (MULTIVITAMIN WITH MINERALS) tablet Take 1 tablet by mouth daily.    Marland Kitchen alendronate (FOSAMAX) 70 MG tablet Take 70 mg by mouth every 7 (seven) days. Take with a full glass of water on an empty stomach. Patient not sure of dose..    . VITAMIN D, CHOLECALCIFEROL, PO Take by mouth.     No current facility-administered medications on file prior to visit.    BP 98/62 mmHg  Pulse 67  Temp(Src) 98.9 F (37.2 C) (Oral)  Ht 5' 3.25" (1.607 m)  Wt 107 lb (48.535 kg)  BMI 18.79 kg/m2  SpO2 97%  LMP 02/18/2004       Objective:   Physical Exam  Constitutional: She is oriented to person, place, and time. She appears well-developed and well-nourished. No distress.  HENT:  Head: Normocephalic and atraumatic.  Right Ear: External ear normal.  Left Ear: External ear normal.  Nose: Nose normal.  Mouth/Throat: Oropharynx is clear and moist. No oropharyngeal exudate.  TM visualized. No signs or symptoms of infection.   Eyes: Conjunctivae and EOM are normal. Pupils are equal, round, and reactive to light. Right eye exhibits no discharge. Left eye exhibits no discharge.  Cardiovascular: Normal rate, regular rhythm,  normal heart sounds and intact distal pulses.  Exam reveals no gallop and no friction rub.   No murmur heard. Pulmonary/Chest: Effort normal and breath sounds normal. No respiratory distress. She has no wheezes. She has no rales. She exhibits no tenderness.  Neurological: She is alert and oriented to person, place, and time.  Skin: Skin is warm and dry. She is not diaphoretic.  Psychiatric: She has a normal mood and affect. Her behavior is normal. Judgment and thought content normal.  Nursing note and vitals reviewed.      Assessment & Plan:  1. Acute upper respiratory infection - Likely viral in nature.  - OTC zyrtec  and Flonase - OTC Mucinex - She refused all of these - Rest and plenty of fluids - Follow up if no improvement in 2-3 days

## 2014-07-21 ENCOUNTER — Ambulatory Visit: Payer: 59 | Admitting: Adult Health

## 2014-07-21 DIAGNOSIS — Z0289 Encounter for other administrative examinations: Secondary | ICD-10-CM

## 2014-08-27 DIAGNOSIS — G43109 Migraine with aura, not intractable, without status migrainosus: Secondary | ICD-10-CM | POA: Insufficient documentation

## 2014-08-27 DIAGNOSIS — F509 Eating disorder, unspecified: Secondary | ICD-10-CM | POA: Insufficient documentation

## 2016-12-01 ENCOUNTER — Telehealth: Payer: Self-pay | Admitting: Physician Assistant

## 2016-12-01 ENCOUNTER — Encounter: Payer: Self-pay | Admitting: Physician Assistant

## 2016-12-01 ENCOUNTER — Ambulatory Visit (INDEPENDENT_AMBULATORY_CARE_PROVIDER_SITE_OTHER): Payer: 59 | Admitting: Physician Assistant

## 2016-12-01 VITALS — BP 96/60 | HR 72 | Temp 98.3°F | Resp 18 | Ht 63.0 in

## 2016-12-01 DIAGNOSIS — F509 Eating disorder, unspecified: Secondary | ICD-10-CM

## 2016-12-01 DIAGNOSIS — Z23 Encounter for immunization: Secondary | ICD-10-CM | POA: Diagnosis not present

## 2016-12-01 NOTE — Progress Notes (Signed)
Patient ID: [name], female     DOB: [birth year], 61 y.o.    MRN: [id number]  PCP: Harrison Mons, PA-C, as of today  Chief Complaint  Patient presents with  . Establish Care  . Depression    depression scale score 11    Subjective:   This patient is new to this provider and presents for evaluation of disordered eating, on the request of her nutritionist.  From her nutritionist: Here's some other info on BL:  -she's chronically experienced AN with binge/purge tendencies for decades. I just started seeing her last month and she's been working with her therapist Andres Shad for much longer.  -she's been to residential treatment yet started purging minutes after discharge. More recently she went to a [facility] partial program where she ate 1600 kcal at their two meals then used laxatives and purged at night at home. I tell you all this because I don't think she's ever really been adequately refed or rehabilitated.  -she has been denying the seriousness of her ED because she is able work out for hours everyday. 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 Easily she does 3-4 hours intense exercise everyday.  -she reports no laxative use at the moment and she is so scared she will bump her calories to a point where she will go back to them.  -her very good friend she met in treatment died a few months ago. She was in recovery yet died with an enlarged heart and had abnormal electrolytes. B is confused by this yet also motivated to not die.  -she refuses to be weighed. Please do not weigh her if you can in the beginning. I think it will help her trust you.  -she thinks you will wonder why she is there since she doesn't look sick. I reassured her you will not yet alas....  -I've seen her 3 times now and she agreed today to finally increase her intake....35 calories. This is slooow.  -she is guarded and scared and not well. She is a fun person to work with  and I have enjoyed our meetings.  -I'm concerned by a lot of things yet most concerned about these blacking out experiences. I've been reading more about low blood sugar causing ED related deaths. She is getting <800 kcal a day from lettuce, peanut butter powder and a rice cake or two. Plus exercise 3-4 hours. How can her blood sugar be ok? Wondering about her heart too.    Unfortunately, I was running behind schedule, and the patient left after 45 minutes, and did not reschedule. I left a message for her nutritionist regarding the situation. I am happy to see her if she will reschedule.  Review of Systems   Prior to Admission medications   Medication Sig Start Date End Date Taking? Authorizing Provider  alendronate (FOSAMAX) 70 MG tablet Take 70 mg by mouth every 7 (seven) days. Take with a full glass of water on an empty stomach. Patient not sure of dose..   Yes [provider]  calcium carbonate 200 MG capsule Take 250 mg by mouth 2 (two) times daily with a meal.   Yes [provider]  citalopram (CELEXA) 40 MG tablet Take 40 mg by mouth daily.   Yes [provider]  diazepam (VALIUM) 10 MG tablet TAKE 1 TABLET BY MOUTH EVERY DAY AS NEEDED FOR ANXIETY 11/22/16  Yes [provider]  Multiple Vitamins-Minerals (MULTIVITAMIN WITH  MINERALS) tablet Take 1 tablet by mouth daily.   Yes [provider]  VITAMIN D, CHOLECALCIFEROL, PO Take by mouth.   Yes [provider]  ALPRAZolam (XANAX) 0.25 MG tablet Take 0.25 mg by mouth 2 (two) times daily.    [provider]  L-Methylfolate (DEPLIN PO) Take by mouth. Does not know the dose    [provider]     Allergies  Allergen Reactions  . Compazine [Prochlorperazine Edisylate]     Muscle tremors  . Cephalexin Rash  . Epinephrine Palpitations     Patient Active Problem List   Diagnosis Date Noted  . 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 Lower abdominal pain 12/11/2011    . 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  . Osteoporosis 09/19/2011  . Numbness and tingling 09/19/2011  . Weakness generalized 09/19/2011  . Retinitis pigmentosa 09/19/2011  . History of eating disorder 08/18/2011     Family History  Problem Relation Age of Onset  . Cancer Father   . Heart disease Father   . Diabetes Sister      Social History   Social History  . Marital status: Single    Spouse name: N/A  . Number of children: N/A  . Years of education: N/A   Occupational History  . Not on file.   Social History Main Topics  . Smoking status: Never Smoker  . Smokeless tobacco: Never Used  . Alcohol use No  . Drug use: No  . Sexual activity: Not on file   Other Topics Concern  . Not on file   Social History Narrative  . No narrative on file         Objective:  Physical Exam  Patient left without seeing provider         Assessment & Plan:  1. Eating disorder with ongoing treatment 2. Flu vaccine need Patient left without seeing provider, did not reschedule. - Flu Vaccine QUAD 36+ mos IM    No Follow-up on file.   [name], PA-C [facility]

## 2016-12-01 NOTE — Telephone Encounter (Signed)
Pt came in for 11:40 appt and left without being seen. Said she had to leave work for appt and waited an hour without being seen. She requested that Chelle contact her nutritionist Marcy Salvo. Pt did not leave phone number but it looks like the office can be reached at [phone]. Thanks.

## 2016-12-01 NOTE — Patient Instructions (Addendum)
We recommend that you schedule a mammogram for breast cancer screening. Typically, you do not need a referral to do this. Please contact a local imaging center to schedule your mammogram.  [facility] - [phone]  *ask for the Radiology Department [facility] - [phone] or [phone]  [facility] - [phone] [facility] - [phone] [facility] - [phone]  *ask for the Radiology Department [facility] - [phone]  *ask for the Radiology Department [facility] - [phone]  *ask for the Mammography Department [facility] - [phone]     IF you received an x-ray today, you will receive an invoice from [facility]. Please contact [facility] at [phone] with questions or concerns regarding your invoice.   IF you received labwork today, you will receive an invoice from [facility]. Please contact [facility] at [phone] with questions or concerns regarding your invoice.   Our billing staff will not be able to assist you with questions regarding bills from these companies.  You will be contacted with the lab results as soon as they are available. The fastest way to get your results is to activate your My Chart account. Instructions are located on the last page of this paperwork. If you have not heard from us regarding the results in 2 weeks, please contact this office.      

## 2016-12-05 NOTE — Telephone Encounter (Signed)
Chelle called and left VM for pt. Pt has not made a new appointment.

## 2016-12-08 ENCOUNTER — Encounter: Payer: Self-pay | Admitting: Physician Assistant

## 2016-12-08 ENCOUNTER — Emergency Department (HOSPITAL_COMMUNITY): Payer: 59

## 2016-12-08 ENCOUNTER — Encounter (HOSPITAL_COMMUNITY): Payer: Self-pay | Admitting: *Deleted

## 2016-12-08 ENCOUNTER — Emergency Department (HOSPITAL_COMMUNITY)
Admission: EM | Admit: 2016-12-08 | Discharge: 2016-12-09 | Disposition: A | Discharge status: Home / Self Care | Payer: 59 | Attending: Emergency Medicine | Admitting: Emergency Medicine

## 2016-12-08 ENCOUNTER — Ambulatory Visit (INDEPENDENT_AMBULATORY_CARE_PROVIDER_SITE_OTHER): Payer: 59

## 2016-12-08 ENCOUNTER — Ambulatory Visit (INDEPENDENT_AMBULATORY_CARE_PROVIDER_SITE_OTHER): Payer: 59 | Admitting: Physician Assistant

## 2016-12-08 VITALS — BP 98/66 | HR 112 | Resp 18 | Ht 63.0 in

## 2016-12-08 DIAGNOSIS — Y9301 Activity, walking, marching and hiking: Secondary | ICD-10-CM | POA: Diagnosis not present

## 2016-12-08 DIAGNOSIS — Y92410 Unspecified street and highway as the place of occurrence of the external cause: Secondary | ICD-10-CM | POA: Insufficient documentation

## 2016-12-08 DIAGNOSIS — S0990XA Unspecified injury of head, initial encounter: Secondary | ICD-10-CM | POA: Diagnosis present

## 2016-12-08 DIAGNOSIS — R413 Other amnesia: Secondary | ICD-10-CM | POA: Insufficient documentation

## 2016-12-08 DIAGNOSIS — R0789 Other chest pain: Secondary | ICD-10-CM

## 2016-12-08 DIAGNOSIS — Y998 Other external cause status: Secondary | ICD-10-CM | POA: Diagnosis not present

## 2016-12-08 DIAGNOSIS — Z114 Encounter for screening for human immunodeficiency virus [HIV]: Secondary | ICD-10-CM | POA: Diagnosis not present

## 2016-12-08 DIAGNOSIS — R55 Syncope and collapse: Secondary | ICD-10-CM | POA: Diagnosis not present

## 2016-12-08 DIAGNOSIS — F509 Eating disorder, unspecified: Secondary | ICD-10-CM | POA: Diagnosis not present

## 2016-12-08 DIAGNOSIS — R404 Transient alteration of awareness: Secondary | ICD-10-CM

## 2016-12-08 DIAGNOSIS — F325 Major depressive disorder, single episode, in full remission: Secondary | ICD-10-CM | POA: Diagnosis not present

## 2016-12-08 DIAGNOSIS — I609 Nontraumatic subarachnoid hemorrhage, unspecified: Secondary | ICD-10-CM | POA: Diagnosis not present

## 2016-12-08 DIAGNOSIS — M79604 Pain in right leg: Secondary | ICD-10-CM

## 2016-12-08 DIAGNOSIS — F411 Generalized anxiety disorder: Secondary | ICD-10-CM

## 2016-12-08 DIAGNOSIS — S0101XA Laceration without foreign body of scalp, initial encounter: Secondary | ICD-10-CM | POA: Diagnosis not present

## 2016-12-08 DIAGNOSIS — M818 Other osteoporosis without current pathological fracture: Secondary | ICD-10-CM

## 2016-12-08 DIAGNOSIS — R001 Bradycardia, unspecified: Secondary | ICD-10-CM

## 2016-12-08 HISTORY — DX: Anorexia: R63.0

## 2016-12-08 LAB — CBC WITH DIFFERENTIAL/PLATELET
Basophils Absolute: 0 10*3/uL (ref 0.0–0.1)
Basophils Relative: 0 %
Eosinophils Absolute: 0.1 10*3/uL (ref 0.0–0.7)
Eosinophils Relative: 1 %
HCT: 39.7 % (ref 39.0–52.0)
Hemoglobin: 13.3 g/dL (ref 13.0–17.0)
Lymphocytes Relative: 54 %
Lymphs Abs: 3.2 10*3/uL (ref 0.7–4.0)
MCH: 30 pg (ref 26.0–34.0)
MCHC: 33.5 g/dL (ref 30.0–36.0)
MCV: 89.4 fL (ref 78.0–100.0)
Monocytes Absolute: 0.3 10*3/uL (ref 0.1–1.0)
Monocytes Relative: 5 %
Neutro Abs: 2.4 10*3/uL (ref 1.7–7.7)
Neutrophils Relative %: 40 %
Platelets: 186 10*3/uL (ref 150–400)
RBC: 4.44 MIL/uL (ref 4.22–5.81)
RDW: 12.7 % (ref 11.5–15.5)
WBC: 5.9 10*3/uL (ref 4.0–10.5)

## 2016-12-08 LAB — I-STAT CHEM 8, ED
BUN: 29 mg/dL — ABNORMAL HIGH (ref 6–20)
Calcium, Ion: 1.06 mmol/L — ABNORMAL LOW (ref 1.15–1.40)
Chloride: 97 mmol/L — ABNORMAL LOW (ref 101–111)
Creatinine, Ser: 0.7 mg/dL (ref 0.61–1.24)
Glucose, Bld: 151 mg/dL — ABNORMAL HIGH (ref 65–99)
HCT: 41 % (ref 39.0–52.0)
Hemoglobin: 13.9 g/dL (ref 13.0–17.0)
Potassium: 4.4 mmol/L (ref 3.5–5.1)
Sodium: 133 mmol/L — ABNORMAL LOW (ref 135–145)
TCO2: 30 mmol/L (ref 22–32)

## 2016-12-08 LAB — PROTIME-INR
INR: 1.04
Prothrombin Time: 13.5 seconds (ref 11.4–15.2)

## 2016-12-08 MED ORDER — TETANUS-DIPHTH-ACELL PERTUSSIS 5-2.5-18.5 LF-MCG/0.5 IM SUSP
0.5000 mL | Freq: Once | INTRAMUSCULAR | Status: AC
  Administered 2016-12-08: 0.5 mL via INTRAMUSCULAR
  Filled 2016-12-08: qty 0.5

## 2016-12-08 MED ORDER — LIDOCAINE-EPINEPHRINE (PF) 2 %-1:200000 IJ SOLN
10.0000 mL | Freq: Once | INTRAMUSCULAR | Status: DC
  Filled 2016-12-08: qty 20
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
## 2016-12-08 NOTE — Assessment & Plan Note (Signed)
Due to Anorexia Nervosa. Continue calcium, bisphosphonate.

## 2016-12-08 NOTE — Assessment & Plan Note (Signed)
Appears well controlled on the current regimen.

## 2016-12-08 NOTE — Assessment & Plan Note (Signed)
Uncontrolled Anorexia Nervosa. Suspect that this is causing bradycardia which is causing the episodes of lost consciousness. Await lab results, anticipate referral to cardiology.
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
## 2016-12-08 NOTE — Assessment & Plan Note (Signed)
Increased symptoms since the death of a friend. No medication changes desired.

## 2016-12-08 NOTE — ED Notes (Signed)
Lacerations cleaned with soap and water dsd applied

## 2016-12-08 NOTE — ED Notes (Signed)
The pt is c/o a sl headache and pain in her rt leg upper

## 2016-12-08 NOTE — ED Notes (Signed)
NOTE: pt. Stated that she is allergic to epinephrine. This information was given by the patient's long time friend (present at bedside) who stated that the patient "suddenly recalled that she was allergic".

## 2016-12-08 NOTE — ED Notes (Signed)
Lacerations cleaned with soap and water

## 2016-12-08 NOTE — ED Triage Notes (Signed)
The pt arrived by gems from the [facility] in their parking lot  Lac top of her head approx 2 '  [place]

## 2016-12-08 NOTE — ED Notes (Signed)
TO CT

## 2016-12-08 NOTE — ED Notes (Signed)
PTS MOTHER HERE   THE PT DOES NOT KNOW HER MOTHER AND SHE ASKS OVER AND OVER WHERE SHE IS AND WHAT HAPPENED.  SHE REPLIES THAT SHE IS AFRAID

## 2016-12-08 NOTE — ED Notes (Signed)
The pt is totally oriented now

## 2016-12-08 NOTE — ED Notes (Signed)
Pt returned from ct

## 2016-12-08 NOTE — Patient Instructions (Signed)
     IF you received an x-ray today, you will receive an invoice from [facility]. Please contact [facility] at [phone] with questions or concerns regarding your invoice.   IF you received labwork today, you will receive an invoice from [facility]. Please contact [facility] at [phone] with questions or concerns regarding your invoice.   Our billing staff will not be able to assist you with questions regarding bills from these companies.  You will be contacted with the lab results as soon as they are available. The fastest way to get your results is to activate your My Chart account. Instructions are located on the last page of this paperwork. If you have not heard from us regarding the results in 2 weeks, please contact this office.     

## 2016-12-08 NOTE — ED Notes (Signed)
The pt knows some of her information now  She also now knoiws her mother

## 2016-12-08 NOTE — ED Notes (Signed)
Pt up walking in hallway  C.o more pain in her rt hip and thigh  More xrays rfequested

## 2016-12-09 ENCOUNTER — Encounter: Payer: Self-pay | Admitting: Physician Assistant

## 2016-12-09 MED ORDER — ACETAMINOPHEN 325 MG PO TABS: 650.0000 mg | ORAL_TABLET | Freq: Once | ORAL | Status: DC

## 2016-12-09 MED ORDER — CYCLOBENZAPRINE HCL 10 MG PO TABS: 10.0000 mg | ORAL_TABLET | Freq: Two times a day (BID) | ORAL | 0 refills | Status: DC | PRN

## 2016-12-09 MED ORDER — CYCLOBENZAPRINE HCL 10 MG PO TABS: 10.0000 mg | ORAL_TABLET | Freq: Once | ORAL | Status: DC

## 2016-12-10 NOTE — ED Notes (Signed)
The pts is asking for her rings  She thinks they are missing however she did not have rings on when she arrived in the ed.  Clothes were cut off by [employer]  And discarded in the ed   Pt agreed to toss them

## 2016-12-10 NOTE — Addendum Note (Signed)
Addended by: [name] on: 12/10/2016 03:31 PM   Modules accepted: Orders

## 2016-12-12 ENCOUNTER — Ambulatory Visit: Payer: 59 | Admitting: Physician Assistant

## 2016-12-12 LAB — HIV ANTIBODY (ROUTINE TESTING W REFLEX): HIV Screen 4th Generation wRfx: NONREACTIVE

## 2016-12-12 LAB — CBC WITH DIFFERENTIAL/PLATELET
Basophils Absolute: 0 10*3/uL (ref 0.0–0.2)
Basos: 0 %
EOS (ABSOLUTE): 0.1 10*3/uL (ref 0.0–0.4)
Eos: 1 %
Hematocrit: 37.7 % (ref 34.0–46.6)
Hemoglobin: 12.9 g/dL (ref 11.1–15.9)
Immature Grans (Abs): 0 10*3/uL (ref 0.0–0.1)
Immature Granulocytes: 0 %
Lymphocytes Absolute: 2 10*3/uL (ref 0.7–3.1)
Lymphs: 42 %
MCH: 30.5 pg (ref 26.6–33.0)
MCHC: 34.2 g/dL (ref 31.5–35.7)
MCV: 89 fL (ref 79–97)
Monocytes Absolute: 0.4 10*3/uL (ref 0.1–0.9)
Monocytes: 8 %
Neutrophils Absolute: 2.3 10*3/uL (ref 1.4–7.0)
Neutrophils: 49 %
Platelets: 221 10*3/uL (ref 150–379)
RBC: 4.23 x10E6/uL (ref 3.77–5.28)
RDW: 13.1 % (ref 12.3–15.4)
WBC: 4.8 10*3/uL (ref 3.4–10.8)

## 2016-12-12 LAB — HEPATIC FUNCTION PANEL
ALT: 32 IU/L (ref 0–32)
AST: 28 IU/L (ref 0–40)
Albumin: 4.6 g/dL (ref 3.6–4.8)
Alkaline Phosphatase: 62 IU/L (ref 39–117)
Bilirubin Total: 0.3 mg/dL (ref 0.0–1.2)
Bilirubin, Direct: 0.09 mg/dL (ref 0.00–0.40)
Total Protein: 7.2 g/dL (ref 6.0–8.5)

## 2016-12-12 LAB — URINALYSIS, DIPSTICK ONLY
Bilirubin, UA: NEGATIVE
Glucose, UA: NEGATIVE
Ketones, UA: NEGATIVE
Leukocytes, UA: NEGATIVE
Nitrite, UA: NEGATIVE
Protein, UA: NEGATIVE
RBC, UA: NEGATIVE
Specific Gravity, UA: 1.01 (ref 1.005–1.030)
Urobilinogen, Ur: 0.2 mg/dL (ref 0.2–1.0)
pH, UA: 7 (ref 5.0–7.5)

## 2016-12-12 LAB — LIPASE: Lipase: 73 U/L — ABNORMAL HIGH (ref 14–72)

## 2016-12-12 LAB — T4: T4, Total: 6.2 ug/dL (ref 4.5–12.0)

## 2016-12-12 LAB — ACUTE HEP PANEL AND HEP B SURFACE AB
Hep A IgM: NEGATIVE
Hep B C IgM: NEGATIVE
Hep C Virus Ab: <0.1 s/co ratio (ref 0.0–0.9)
Hepatitis B Surf Ab Quant: 115.4 m[IU]/mL (ref >9.9)
Hepatitis B Surface Ag: NEGATIVE

## 2016-12-12 LAB — BASIC METABOLIC PANEL
BUN/Creatinine Ratio: 28 (ref 12–28)
BUN: 20 mg/dL (ref 8–27)
CO2: 28 mmol/L (ref 20–29)
Calcium: 10.2 mg/dL (ref 8.7–10.3)
Chloride: 92 mmol/L — ABNORMAL LOW (ref 96–106)
Creatinine, Ser: 0.71 mg/dL (ref 0.57–1.00)
GFR calc Af Amer: 106 mL/min/{1.73_m2} (ref >59)
GFR calc non Af Amer: 92 mL/min/{1.73_m2} (ref >59)
Glucose: 100 mg/dL — ABNORMAL HIGH (ref 65–99)
Potassium: 4.2 mmol/L (ref 3.5–5.2)
Sodium: 134 mmol/L (ref 134–144)

## 2016-12-12 LAB — FERRITIN: Ferritin: 69 ng/mL (ref 15–150)

## 2016-12-12 LAB — VITAMIN D 1,25 DIHYDROXY
Vitamin D 1, 25 (OH)2 Total: 51 pg/mL
Vitamin D2 1, 25 (OH)2: <10 pg/mL
Vitamin D3 1, 25 (OH)2: 46 pg/mL

## 2016-12-12 LAB — MAGNESIUM: Magnesium: 2 mg/dL (ref 1.6–2.3)

## 2016-12-12 LAB — T3, FREE: T3, Free: 2.7 pg/mL (ref 2.0–4.4)

## 2016-12-12 LAB — AMYLASE: Amylase: 199 U/L — ABNORMAL HIGH (ref 31–124)

## 2016-12-12 LAB — PHOSPHORUS: Phosphorus: 4.2 mg/dL (ref 2.5–4.5)

## 2016-12-12 LAB — TSH: TSH: 2.23 u[IU]/mL (ref 0.450–4.500)

## 2016-12-12 LAB — VITAMIN B12: Vitamin B-12: 1386 pg/mL — ABNORMAL HIGH (ref 232–1245)

## 2016-12-13 ENCOUNTER — Encounter: Payer: Self-pay | Admitting: Physician Assistant

## 2016-12-13 ENCOUNTER — Ambulatory Visit (INDEPENDENT_AMBULATORY_CARE_PROVIDER_SITE_OTHER): Payer: 59 | Admitting: Physician Assistant

## 2016-12-13 ENCOUNTER — Ambulatory Visit (INDEPENDENT_AMBULATORY_CARE_PROVIDER_SITE_OTHER): Payer: 59

## 2016-12-13 VITALS — BP 108/66 | HR 79 | Temp 98.1°F | Resp 18 | Ht 62.0 in

## 2016-12-13 DIAGNOSIS — F509 Eating disorder, unspecified: Secondary | ICD-10-CM | POA: Diagnosis not present

## 2016-12-13 DIAGNOSIS — F325 Major depressive disorder, single episode, in full remission: Secondary | ICD-10-CM

## 2016-12-13 DIAGNOSIS — I609 Nontraumatic subarachnoid hemorrhage, unspecified: Secondary | ICD-10-CM | POA: Diagnosis not present

## 2016-12-13 DIAGNOSIS — R001 Bradycardia, unspecified: Secondary | ICD-10-CM

## 2016-12-13 DIAGNOSIS — R404 Transient alteration of awareness: Secondary | ICD-10-CM

## 2016-12-13 DIAGNOSIS — M25561 Pain in right knee: Secondary | ICD-10-CM

## 2016-12-13 DIAGNOSIS — F411 Generalized anxiety disorder: Secondary | ICD-10-CM | POA: Diagnosis not present
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
## 2016-12-13 NOTE — Patient Instructions (Addendum)
No exercise. No work for the rest of the week.    IF you received an x-ray today, you will receive an invoice from [facility]. Please contact [facility] at [phone] with questions or concerns regarding your invoice.   IF you received labwork today, you will receive an invoice from [facility]. Please contact [facility] at [phone] with questions or concerns regarding your invoice.   Our billing staff will not be able to assist you with questions regarding bills from these companies.  You will be contacted with the lab results as soon as they are available. The fastest way to get your results is to activate your My Chart account. Instructions are located on the last page of this paperwork. If you have not heard from [place] regarding the results in 2 weeks, please contact this office.

## 2016-12-14 ENCOUNTER — Telehealth: Payer: Self-pay | Admitting: Physician Assistant

## 2016-12-14 NOTE — Telephone Encounter (Signed)
Pt is looking for the results of her xrays for her right knee  Best number [phone]

## 2016-12-15 ENCOUNTER — Telehealth: Payer: Self-pay | Admitting: Physician Assistant

## 2016-12-15 ENCOUNTER — Telehealth: Payer: Self-pay

## 2016-12-15 NOTE — Telephone Encounter (Signed)
Notified pt. Pt NOT happy about it.

## 2016-12-15 NOTE — Telephone Encounter (Signed)
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 Pt stated she would like to have it by Monday 12/18/16  Please advise

## 2016-12-15 NOTE — Telephone Encounter (Signed)
Pt was notified via MyChart and pt has viewed message.

## 2016-12-15 NOTE — Telephone Encounter (Signed)
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 Pt states she wants you to know that she is still doing her "[employer]" class. 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

## 2016-12-15 NOTE — Telephone Encounter (Signed)
I recommend AGAINST ALL exercise.

## 2016-12-18 ENCOUNTER — Telehealth: Payer: Self-pay | Admitting: Physician Assistant

## 2016-12-18 NOTE — Telephone Encounter (Signed)
Our plan was that I would see her this week and we would discuss whether return to work/exercise was appropriate.  Her appointment is tomorrow, 12/19/2016 at 8:20 am. We will discuss it then.

## 2016-12-18 NOTE — Telephone Encounter (Signed)
Pt states that [name] will not release her to go back to exercise classes but will she release her today to go back to work   [employer] number [phone]

## 2016-12-18 NOTE — Telephone Encounter (Signed)
Please advise 

## 2016-12-19 ENCOUNTER — Ambulatory Visit: Payer: 59 | Admitting: Physician Assistant

## 2016-12-19 ENCOUNTER — Encounter: Payer: Self-pay | Admitting: Physician Assistant

## 2016-12-19 VITALS — BP 98/60 | HR 79 | Temp 97.6°F | Resp 18 | Ht 62.0 in

## 2016-12-19 DIAGNOSIS — R404 Transient alteration of awareness: Secondary | ICD-10-CM | POA: Diagnosis not present

## 2016-12-19 DIAGNOSIS — Z4802 Encounter for removal of sutures: Secondary | ICD-10-CM | POA: Diagnosis not present

## 2016-12-19 DIAGNOSIS — M25561 Pain in right knee: Secondary | ICD-10-CM

## 2016-12-19 DIAGNOSIS — R001 Bradycardia, unspecified: Secondary | ICD-10-CM

## 2016-12-19 DIAGNOSIS — F509 Eating disorder, unspecified: Secondary | ICD-10-CM | POA: Diagnosis not present
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
## 2016-12-19 NOTE — Patient Instructions (Addendum)
You may return to any work that is sedentary. 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 Take it easy. Be generous to yourself. Keep up the great work following the eating plan. Sleep.   IF you received an x-ray today, you will receive an invoice from [facility]. Please contact [facility] at [phone] with questions or concerns regarding your invoice.   IF you received labwork today, you will receive an invoice from [facility]. Please contact [facility] at [phone] with questions or concerns regarding your invoice.   Our billing staff will not be able to assist you with questions regarding bills from these companies.  You will be contacted with the lab results as soon as they are available. The fastest way to get your results is to activate your My Chart account. Instructions are located on the last page of this paperwork. If you have not heard from [place] regarding the results in 2 weeks, please contact this office.

## 2016-12-19 NOTE — Assessment & Plan Note (Signed)
Following 1000 kCal plan presently with success. 

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
## 2016-12-21 ENCOUNTER — Ambulatory Visit: Payer: 59 | Admitting: Cardiology

## 2016-12-21 ENCOUNTER — Encounter: Payer: Self-pay | Admitting: Cardiology

## 2016-12-21 VITALS — BP 86/52 | HR 62 | Ht 62.0 in

## 2016-12-21 DIAGNOSIS — I950 Idiopathic hypotension: Secondary | ICD-10-CM

## 2016-12-21 DIAGNOSIS — R001 Bradycardia, unspecified: Secondary | ICD-10-CM | POA: Diagnosis not present

## 2016-12-21 DIAGNOSIS — R9431 Abnormal electrocardiogram [ECG] [EKG]: Secondary | ICD-10-CM

## 2016-12-21 NOTE — Patient Instructions (Signed)
Medication Instructions:  Continue current medications  If you need a refill on your cardiac medications before your next appointment, please call your pharmacy.  Labwork: None Ordered  Testing/Procedures: None Ordered  Follow-Up: Your physician wants you to follow-up in: As Needed.      Thank you for choosing [facility]!!       

## 2016-12-22 ENCOUNTER — Ambulatory Visit: Payer: Self-pay | Admitting: *Deleted
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
## 2016-12-25 NOTE — Telephone Encounter (Signed)
[name],  I spoke with patient she is upset that no one got back to her and message was not sent Friday.  She is doing better and will wait for her appointment on 11/14 to come in.     I did explain that if she is feeling that bad again not to wait if she cannot reach [place] and go straight to ER especially if it is going into weekend.  Patient agreed she will.

## 2016-12-26 NOTE — Telephone Encounter (Signed)
I spoke with this patient's nutritionist, York Ram. Patient continues to exercise, despite recommendation AGAINST exercise. 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 Has increased her calories, per Julie's eating plan.

## 2016-12-27 ENCOUNTER — Encounter: Payer: Self-pay | Admitting: Physician Assistant

## 2016-12-27 ENCOUNTER — Ambulatory Visit: Payer: 59 | Admitting: Physician Assistant

## 2016-12-27 ENCOUNTER — Other Ambulatory Visit: Payer: Self-pay

## 2016-12-27 VITALS — BP 100/62 | HR 74 | Temp 97.9°F | Resp 18 | Ht 62.0 in

## 2016-12-27 DIAGNOSIS — M25551 Pain in right hip: Secondary | ICD-10-CM | POA: Diagnosis not present

## 2016-12-27 DIAGNOSIS — M25561 Pain in right knee: Secondary | ICD-10-CM

## 2016-12-27 DIAGNOSIS — F509 Eating disorder, unspecified: Secondary | ICD-10-CM

## 2016-12-27 MED ORDER — MELOXICAM 15 MG PO TABS: 15.0000 mg | ORAL_TABLET | Freq: Every day | ORAL | 1 refills | Status: DC

## 2016-12-27 NOTE — Assessment & Plan Note (Signed)
Pulse is normal today, which is encouraging. No further episodes of what I believe were loss of consciousness since reducing sugar-free gum/candy consumption. Elect against planned fasting insulin level today, as it would be unlikely to afford [place] additional information or change recommendations. Advised against all exercise. Reiterated by desire to help her be healthy, not to punish her, and that I care about her and her health, and desire for her to become healthy enough to resume regular exercise. Continue eating plan per York Ram, including frequent, smaller meals, and regular sessions with Burnard Leigh. Continue current SSRI and PRN benzodiazepine. It was notable that upon leaving, her demeanor changed from tearful and angry to very pleasant and almost cheerful, "See you next week!"

## 2016-12-27 NOTE — Patient Instructions (Addendum)
I continue to recommend AGAINST ALL EXERCISE. 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  Keep up the GREAT work you are doing with eating Julie's plan. Continue to see Burnard Leigh.  Take the cyclobenzaprine (Flexeril) at bedtime. Take the meloxicam at bedtime.  The orthopedics office will contact you directly.  We recommend that you schedule a mammogram for breast cancer screening. Typically, you do not need a referral to do this. Please contact a local imaging center to schedule your mammogram.  [facility] - [phone]  *ask for the Radiology Department [facility] ([place]) - [phone] or [phone]  [facility] - [phone] [place] [phone] [facility] - [phone]  *ask for the [facility] - [phone]  *ask for the Radiology Department [facility] - [phone]  *ask for the [facility] - [phone]    IF you received an x-ray today, you will receive an invoice from [facility]iology. Please contact [facility]iology at [phone] with questions or concerns regarding your invoice.   IF you received labwork today, you will receive an invoice from [facility]. Please contact [facility] at [phone] with questions or concerns regarding your invoice.   Our billing staff will not be able to assist you with questions regarding bills from these companies.  You will be contacted with the lab results as soon as they are available. The fastest way to get your results is to activate your My Chart account. Instructions are located on the last page of this paperwork. If you have not heard from [place] regarding the results in 2 weeks, please contact this office.
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
## 2016-12-31 NOTE — Assessment & Plan Note (Signed)
Desires no change in treatment. Continue counseling.

## 2016-12-31 NOTE — Assessment & Plan Note (Signed)
ADvised against exercise. Encouraged increased calories as recommended by her nutritionist. Continue counseling with therapist. Encouraged her to schedule with cardiology due to bradycardia, though she has not had additional episodes of loss of consciousness since reducing her use of sugar free gum/candy.

## 2016-12-31 NOTE — Assessment & Plan Note (Signed)
Desires no change in treatment. Continue counseling. Consider discussing medication adjustment with her prescriber in [place].

## 2017-01-02 ENCOUNTER — Other Ambulatory Visit: Payer: Self-pay

## 2017-01-02 ENCOUNTER — Ambulatory Visit: Payer: 59 | Admitting: Physician Assistant

## 2017-01-02 ENCOUNTER — Encounter: Payer: Self-pay | Admitting: Physician Assistant

## 2017-01-02 VITALS — BP 96/60 | HR 72 | Temp 97.5°F | Resp 18 | Ht 64.5 in

## 2017-01-02 DIAGNOSIS — M25561 Pain in right knee: Secondary | ICD-10-CM | POA: Diagnosis not present

## 2017-01-02 DIAGNOSIS — Z1231 Encounter for screening mammogram for malignant neoplasm of breast: Secondary | ICD-10-CM

## 2017-01-02 DIAGNOSIS — F509 Eating disorder, unspecified: Secondary | ICD-10-CM | POA: Diagnosis not present

## 2017-01-02 DIAGNOSIS — F411 Generalized anxiety disorder: Secondary | ICD-10-CM

## 2017-01-02 DIAGNOSIS — F325 Major depressive disorder, single episode, in full remission: Secondary | ICD-10-CM

## 2017-01-02 DIAGNOSIS — R51 Headache: Secondary | ICD-10-CM

## 2017-01-02 DIAGNOSIS — R519 Headache, unspecified: Secondary | ICD-10-CM

## 2017-01-02 NOTE — Assessment & Plan Note (Signed)
Aggravated with recent exercise limitations.  She may perform mild exertional upper body exercise for up to 1 hour daily.  Continue follow-up with psychiatry and psychologist.

## 2017-01-02 NOTE — Assessment & Plan Note (Signed)
Her anxiety over the potential loss of physical exercise in the long-term is a primary source of her current distress.  Reassured her that my vision for her future does include getting the exercise that she wants to perform.  She may do mild exertion of the upper body not more than 1 hour/day.  Hopefully this will help to reduce her anxiety and depression but not aggravate her disordered eating.  In order to continue advancing her exercise however, she will be required to follow the eating recommendations by her nutritionist.
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
## 2017-01-02 NOTE — Assessment & Plan Note (Signed)
Resting pulse for her previous 2 visits has been in the low 70s.  Long discussion regarding risk of physical exertion contributing to her disordered eating, even though there are known psychological benefits to getting strenuous exercise.  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  Specifically she is not to ride the exercise bike, do lower extremity exercise or moderate to strenuous exertion.

## 2017-01-02 NOTE — Patient Instructions (Addendum)
Please call [facility] to schedule an appointment with Dr. Joni Fears. 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 NO lower body work.   IF you received an x-ray today, you will receive an invoice from [facility]. Please contact [facility] at [phone] with questions or concerns regarding your invoice.   IF you received labwork today, you will receive an invoice from [facility]. Please contact [facility] at [phone] with questions or concerns regarding your invoice.   Our billing staff will not be able to assist you with questions regarding bills from these companies.  You will be contacted with the lab results as soon as they are available. The fastest way to get your results is to activate your My Chart account. Instructions are located on the last page of this paperwork. If you have not heard from [place] regarding the results in 2 weeks, please contact this office.

## 2017-01-12 ENCOUNTER — Encounter (INDEPENDENT_AMBULATORY_CARE_PROVIDER_SITE_OTHER): Payer: Self-pay | Admitting: Orthopaedic Surgery

## 2017-01-12 ENCOUNTER — Ambulatory Visit (INDEPENDENT_AMBULATORY_CARE_PROVIDER_SITE_OTHER): Payer: 59 | Admitting: Orthopaedic Surgery

## 2017-01-12 ENCOUNTER — Ambulatory Visit (INDEPENDENT_AMBULATORY_CARE_PROVIDER_SITE_OTHER): Payer: Self-pay

## 2017-01-12 VITALS — BP 90/60 | HR 65 | Resp 14 | Ht 62.5 in | Wt 108.0 lb

## 2017-01-12 DIAGNOSIS — G8929 Other chronic pain: Secondary | ICD-10-CM | POA: Diagnosis not present

## 2017-01-12 DIAGNOSIS — M25561 Pain in right knee: Secondary | ICD-10-CM | POA: Diagnosis not present

## 2017-01-12 DIAGNOSIS — R071 Chest pain on breathing: Secondary | ICD-10-CM | POA: Diagnosis not present
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
## 2017-01-17 ENCOUNTER — Encounter: Payer: Self-pay | Admitting: Physician Assistant

## 2017-01-17 ENCOUNTER — Ambulatory Visit: Payer: 59 | Admitting: Physician Assistant

## 2017-01-17 ENCOUNTER — Other Ambulatory Visit: Payer: Self-pay

## 2017-01-17 VITALS — BP 96/60 | HR 70 | Temp 97.4°F | Resp 18 | Ht 62.5 in

## 2017-01-17 DIAGNOSIS — F325 Major depressive disorder, single episode, in full remission: Secondary | ICD-10-CM | POA: Diagnosis not present

## 2017-01-17 DIAGNOSIS — F411 Generalized anxiety disorder: Secondary | ICD-10-CM | POA: Diagnosis not present

## 2017-01-17 DIAGNOSIS — F509 Eating disorder, unspecified: Secondary | ICD-10-CM

## 2017-01-17 NOTE — Patient Instructions (Addendum)
We recommend that you schedule a mammogram for breast cancer screening. Typically, you do not need a referral to do this. Please contact a local imaging center to schedule your mammogram.  [facility] - [phone]  *ask for the Radiology Department [facility] - [phone] or [phone]  [facility] - [phone] [facility] - [phone] [facility] - [phone]  *ask for the Radiology Department [facility] - [phone]  *ask for the Radiology Department [facility] - [phone]  *ask for the Mammography Department [facility] - [phone]     IF you received an x-ray today, you will receive an invoice from [facility]. Please contact [facility] at [phone] with questions or concerns regarding your invoice.   IF you received labwork today, you will receive an invoice from [facility]. Please contact [facility] at [phone] with questions or concerns regarding your invoice.   Our billing staff will not be able to assist you with questions regarding bills from these companies.  You will be contacted with the lab results as soon as they are available. The fastest way to get your results is to activate your My Chart account. Instructions are located on the last page of this paperwork. If you have not heard from us regarding the results in 2 weeks, please contact this office.      
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
## 2017-01-17 NOTE — Assessment & Plan Note (Signed)
Stable. Continue with therapy and psychiatrist.

## 2017-01-17 NOTE — Assessment & Plan Note (Signed)
Stable/improving. Congratulated on her success. Encouraged continued efforts. Continue current level of exercise. Would consider allowing her to do the spin class, but would need to reduce other exercise, and only if knee pain is resolved.
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
## 2017-01-20 ENCOUNTER — Inpatient Hospital Stay: Admission: RE | Admit: 2017-01-20 | Payer: 59 | Source: Ambulatory Visit

## 2017-01-30 ENCOUNTER — Ambulatory Visit
Admission: RE | Admit: 2017-01-30 | Discharge: 2017-01-30 | Disposition: A | Discharge status: Home / Self Care | Payer: 59 | Source: Ambulatory Visit | Attending: Orthopaedic Surgery | Admitting: Orthopaedic Surgery

## 2017-01-30 DIAGNOSIS — G8929 Other chronic pain: Secondary | ICD-10-CM

## 2017-01-30 DIAGNOSIS — M25561 Pain in right knee: Secondary | ICD-10-CM

## 2017-01-31 ENCOUNTER — Telehealth (INDEPENDENT_AMBULATORY_CARE_PROVIDER_SITE_OTHER): Payer: Self-pay | Admitting: Orthopaedic Surgery

## 2017-01-31 ENCOUNTER — Encounter: Payer: Self-pay | Admitting: Physician Assistant

## 2017-01-31 ENCOUNTER — Ambulatory Visit: Payer: 59 | Admitting: Physician Assistant

## 2017-01-31 ENCOUNTER — Other Ambulatory Visit: Payer: Self-pay

## 2017-01-31 VITALS — BP 98/60 | HR 76 | Temp 97.8°F | Resp 18 | Ht 62.5 in

## 2017-01-31 DIAGNOSIS — F509 Eating disorder, unspecified: Secondary | ICD-10-CM

## 2017-01-31 NOTE — Assessment & Plan Note (Signed)
Continues to improve. Struggles with increased anxiety regarding increasing weight. Continue regular visits with nutrition and psychotherapy. If Dr. Durward Fortes is in agreement, based on the MRI results, she may add Spinning back into her exercise regimen, but should still not exceed 60 minutes of exercise/day, and maintain low intensity workouts.

## 2017-01-31 NOTE — Telephone Encounter (Signed)
SPOKE TO HER AND DR Durward Fortes....TOLD HER WE WOULD NOT GIVE HER CLEARANCE TO DO THAT TILL SHE CAN BE REEXAMINED IN OFFICE TO CORRELATE MRI RESULTS

## 2017-01-31 NOTE — Progress Notes (Signed)
Patient ID: [name], female    DOB: 03-03-1955, 61 y.o.   MRN: [id number]  PCP: Harrison Mons, PA-C  Chief Complaint  Patient presents with  . Eating Disorder  . Follow-up    Subjective:   Presents for evaluation of disordered eating.  Knee pain has resolved. Next visit with orthopedics is in about 2 weeks. Continues to do up to 60 minutes of no or low impact, low intensity exercise each day. 45 minutes of no-low impact, low intensity exercise this morning. 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 One class is 45 minutes, one is 60 minutes. She would continue low intensity. She relates that she would be willing to compromise and only be on the bike for 1/2 of the class. 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  Now eating 1200 kCal/day. Adding in flounder. Finds herself ravenous, wanting to eat all the day's calories first thing in the morning. Very thirsty. "You don't think I look fat?" "This is really hard work."  MRI knee was performed yesterday: IMPRESSION: 1. Proximal tear of the MCL without total detachment. There is potential adjacent tearing of the medial patellar retinaculum. 2. Small Baker's cyst with distal semimembranosus tendinopathy and nearby pes anserine bursitis. 3. Small knee effusion. 4. Mildly accentuated signal in the ACL without overt tear, possibly from mild sprain or degeneration. There is also mild proximal PCL degeneration. 5. Subcortical marrow edema along the medial tibial rim without associated chondral defect. There is also mild marrow edema laterally along the lateral femoral condyles. Appearance suggest stress reaction. 6. Minimal chondral fissuring along the posterior patellar ridge.  Review of Systems  Constitutional: Negative.   HENT: Negative for sore throat.   Eyes: Negative for visual disturbance.  Respiratory: Negative for cough, chest tightness, shortness of  breath and wheezing.   Cardiovascular: Negative for chest pain and palpitations.  Gastrointestinal: Negative for abdominal pain, diarrhea, nausea and vomiting.  Endocrine: Positive for polydipsia and polyphagia.  Genitourinary: Negative for dysuria, frequency, hematuria and urgency.  Musculoskeletal: Negative for arthralgias and myalgias.  Skin: Negative for rash.  Neurological: Negative for dizziness, weakness and headaches.  Psychiatric/Behavioral: Positive for dysphoric mood. Negative for decreased concentration and suicidal ideas. The patient is nervous/anxious.        Patient Active Problem List   Diagnosis Date Noted  . Eating disorder with ongoing treatment 08/27/2014  . Ocular migraine 08/27/2014  . GAD (generalized anxiety disorder) 03/13/2014  . Major depressive disorder in remission ([place]) 03/13/2014  . 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 Lower abdominal pain 12/11/2011  . 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  . Osteoporosis 09/19/2011  . Numbness and tingling 09/19/2011  . Weakness generalized 09/19/2011  . Retinitis pigmentosa 09/19/2011  . Age-related osteoporosis without current pathological fracture 09/19/2011  . 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 Authorizing Provider  alendronate (FOSAMAX) 70 MG tablet Take 70 mg by mouth every 7 (seven) days. Take with a full glass of water on an empty stomach. Patient not sure of dose..   Yes [provider]  calcium carbonate 200 MG capsule Take 250 mg by mouth 2 (two) times daily with a meal.   Yes [provider]  citalopram (CELEXA) 40 MG tablet Take 40 mg by mouth daily.   Yes [provider]  diazepam (VALIUM) 10 MG tablet  Take 10 mg by mouth daily.   Yes [provider]  L-Methylfolate (DEPLIN PO) Take by mouth. Does not know the dose   Yes [provider]  Multiple  Vitamins-Minerals (MULTIVITAMIN WITH MINERALS) tablet Take 1 tablet by mouth daily.   Yes [provider]  VITAMIN D, CHOLECALCIFEROL, PO Take by mouth.   Yes [provider]  meloxicam (MOBIC) 15 MG tablet Take 1 tablet (15 mg total) daily by mouth. Patient not taking: Reported on 01/12/2017 12/27/16   Harrison Mons, PA-C     Allergies  Allergen Reactions  . Carbamazepine Palpitations  . Prochlorperazine Edisylate     Muscle tremors Other reaction(s): Other  . Cephalexin Rash  . Amoxicillin-Pot Clavulanate Diarrhea  . Epinephrine Palpitations       Objective:  Physical Exam  Constitutional: She is oriented to person, place, and time. She appears well-developed and well-nourished. She is active and cooperative. No distress.  BP 98/60 (BP Location: Left Arm, Patient Position: Sitting, Cuff Size: Small)   Pulse 76   Temp 97.8 F (36.6 C) (Oral)   Resp 18   Ht 5' 2.5" (1.588 m)   LMP 02/18/2004   SpO2 99%   BMI 19.44 kg/m   HENT:  Head: Normocephalic and atraumatic.  Right Ear: Hearing normal.  Left Ear: Hearing normal.  Eyes: Conjunctivae are normal. No scleral icterus.  Neck: Normal range of motion. Neck supple. No thyromegaly present.  Cardiovascular: Normal rate, regular rhythm and normal heart sounds.  Pulses:      Radial pulses are 2+ on the right side, and 2+ on the left side.  Pulmonary/Chest: Effort normal and breath sounds normal.  Lymphadenopathy:       Head (right side): No tonsillar, no preauricular, no posterior auricular and no occipital adenopathy present.       Head (left side): No tonsillar, no preauricular, no posterior auricular and no occipital adenopathy present.    She has no cervical adenopathy.       Right: No supraclavicular adenopathy present.       Left: No supraclavicular adenopathy present.  Neurological: She is alert and oriented to person, place, and time. No sensory deficit.  Skin: Skin is warm, dry and intact. No rash  noted. No cyanosis or erythema. Nails show no clubbing.  Psychiatric: She has a normal mood and affect. Her speech is normal and behavior is normal.     Wt Readings from Last 3 Encounters:  01/12/17 108 lb (49 kg)  12/08/16 106 lb (48.1 kg)  07/16/14 107 lb (48.5 kg)       Assessment & Plan:   Problem List Items Addressed This Visit    Eating disorder with ongoing treatment - Primary    Continues to improve. Struggles with increased anxiety regarding increasing weight. Continue regular visits with nutrition and psychotherapy. If Dr. Durward Fortes is in agreement, based on the MRI results, she may add Spinning back into her exercise regimen, but should still not exceed 60 minutes of exercise/day, and maintain low intensity workouts.          Return in about 2 weeks (around 02/14/2017) for re-evaluation of eating.   [name], PA-C [facility] [place]

## 2017-01-31 NOTE — Telephone Encounter (Signed)
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   She states that the pain is all gone and will only be on the bike for 1/2 the time.  She wants someone to call her today if possible. CB:[phone]

## 2017-01-31 NOTE — Patient Instructions (Addendum)
I will reach out to Dr. Durward Fortes regarding the addition of Spin Class. If you haven't heard from him or me in 48 hours, please call his office. 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  We recommend that you schedule a mammogram for breast cancer screening. Typically, you do not need a referral to do this. Please contact a local imaging center to schedule your mammogram.  [facility] - [phone]  *ask for the Radiology Department [facility] - [phone] or [phone]  [facility] - [phone] [place] [phone] [facility] - [phone]  *ask for the [facility] - [phone]  *ask for the Radiology Department [facility] - [phone]  *ask for the [facility] - [phone]    IF you received an x-ray today, you will receive an invoice from [facility]. Please contact [facility] at [phone] with questions or concerns regarding your invoice.   IF you received labwork today, you will receive an invoice from [facility]. Please contact [facility] at [phone] with questions or concerns regarding your invoice.   Our billing staff will not be able to assist you with questions regarding bills from these companies.  You will be contacted with the lab results as soon as they are available. The fastest way to get your results is to activate your My Chart account. Instructions are located on the last page of this paperwork. If you have not heard from [place] regarding the results in 2 weeks, please contact this office.

## 2017-01-31 NOTE — Telephone Encounter (Signed)
Please advise 

## 2017-02-08 ENCOUNTER — Telehealth (INDEPENDENT_AMBULATORY_CARE_PROVIDER_SITE_OTHER): Payer: Self-pay | Admitting: Orthopaedic Surgery

## 2017-02-08 NOTE — Telephone Encounter (Signed)
Error-Close Encounter 

## 2017-02-08 NOTE — Telephone Encounter (Signed)
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  CB#  [phone]  Thank you

## 2017-02-08 NOTE — Telephone Encounter (Signed)
LVMOM
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
## 2017-02-21 ENCOUNTER — Ambulatory Visit (INDEPENDENT_AMBULATORY_CARE_PROVIDER_SITE_OTHER): Payer: 59 | Admitting: Orthopedic Surgery

## 2017-02-21 ENCOUNTER — Encounter (INDEPENDENT_AMBULATORY_CARE_PROVIDER_SITE_OTHER): Payer: Self-pay | Admitting: Orthopedic Surgery

## 2017-02-21 VITALS — BP 84/51 | HR 69 | Resp 14 | Ht 63.0 in | Wt 107.0 lb

## 2017-02-21 DIAGNOSIS — S83412D Sprain of medial collateral ligament of left knee, subsequent encounter: Secondary | ICD-10-CM | POA: Diagnosis not present
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
## 2017-02-26 ENCOUNTER — Telehealth (INDEPENDENT_AMBULATORY_CARE_PROVIDER_SITE_OTHER): Payer: Self-pay | Admitting: Orthopaedic Surgery

## 2017-02-26 NOTE — Telephone Encounter (Signed)
Patient called to get information on a knee brace sleeve that Aaron Edelman recommended.  Her CB# [phone]

## 2017-02-27 NOTE — Telephone Encounter (Signed)
Brian's dictation reads: I have sent her over to the brace shop for a DonJoy double upright hinged brace that she can use for her classes. LVMOM

## 2017-02-28 ENCOUNTER — Ambulatory Visit: Payer: 59 | Admitting: Physician Assistant

## 2017-03-06 ENCOUNTER — Telehealth: Payer: Self-pay | Admitting: Physician Assistant

## 2017-03-06 NOTE — Telephone Encounter (Signed)
Spoke to pt and reminded them of their appt tomorrow.  Reminded them to please be 15 minutes early and the 10 minute late policy.

## 2017-03-07 ENCOUNTER — Encounter: Payer: Self-pay | Admitting: Physician Assistant

## 2017-03-07 ENCOUNTER — Ambulatory Visit: Payer: 59 | Admitting: Physician Assistant

## 2017-03-07 ENCOUNTER — Other Ambulatory Visit: Payer: Self-pay

## 2017-03-07 VITALS — BP 90/60 | HR 75 | Temp 98.6°F | Resp 18 | Ht 63.0 in

## 2017-03-07 DIAGNOSIS — S83411D Sprain of medial collateral ligament of right knee, subsequent encounter: Secondary | ICD-10-CM | POA: Diagnosis not present

## 2017-03-07 DIAGNOSIS — F509 Eating disorder, unspecified: Secondary | ICD-10-CM

## 2017-03-07 DIAGNOSIS — M25561 Pain in right knee: Secondary | ICD-10-CM

## 2017-03-07 HISTORY — DX: Pain in right knee: M25.561

## 2017-03-07 NOTE — Assessment & Plan Note (Signed)
Continue use of supportive brace, exercise as before. Follow-up per orthopedics instruction March 2019.

## 2017-03-07 NOTE — Assessment & Plan Note (Signed)
Continue psychotherapy and nutrition therapy. Needs boundaries in the friendship with the person with disordered eating. Continue current exercise plan. Channel the power she's previously used to limit calories into following the new eating plan.

## 2017-03-07 NOTE — Patient Instructions (Addendum)
Follow the plan. Just the plan. Be clear about the boundaries with your friend with ED. Channel your strength in to the plan.  We recommend that you schedule a mammogram for breast cancer screening. Typically, you do not need a referral to do this. Please contact a local imaging center to schedule your mammogram.  [facility] - [phone]  *ask for the Radiology Department [facility] - [phone] or [phone]  [facility] - [phone] [place] [phone] [facility] - [phone]  *ask for the [facility] - [phone]  *ask for the Radiology Department [facility] - [phone]  *ask for the [facility] - [phone]   IF you received an x-ray today, you will receive an invoice from [facility]. Please contact [facility] Radiology at [phone] with questions or concerns regarding your invoice.   IF you received labwork today, you will receive an invoice from [facility]. Please contact [facility] at [phone] with questions or concerns regarding your invoice.   Our billing staff will not be able to assist you with questions regarding bills from these companies.  You will be contacted with the lab results as soon as they are available. The fastest way to get your results is to activate your My Chart account. Instructions are located on the last page of this paperwork. If you have not heard from [place] regarding the results in 2 weeks, please contact this office.
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
## 2017-04-10 ENCOUNTER — Ambulatory Visit: Payer: 59 | Admitting: Physician Assistant

## 2017-04-13 ENCOUNTER — Telehealth (INDEPENDENT_AMBULATORY_CARE_PROVIDER_SITE_OTHER): Payer: Self-pay | Admitting: Orthopaedic Surgery

## 2017-04-13 NOTE — Telephone Encounter (Signed)
Last note from Aaron Edelman did not mention her having a MRI. Please advise.

## 2017-04-13 NOTE — Telephone Encounter (Signed)
Patient called stating that her right knee is still painful.  She states that she had an appointment with Aaron Edelman in January who recommended a knee brace which she purchased.  Patient states she would like Dr. Durward Fortes to order another MRI to see if her knee is getting worse.

## 2017-04-16 NOTE — Telephone Encounter (Signed)
Needs to return to office-please call

## 2017-04-17 NOTE — Telephone Encounter (Signed)
Pt is going to keep appt with Aaron Edelman on 04-25-17 to discuss further options or possible MRI.
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
## 2017-04-17 NOTE — Telephone Encounter (Signed)
thanks

## 2017-04-25 ENCOUNTER — Ambulatory Visit (INDEPENDENT_AMBULATORY_CARE_PROVIDER_SITE_OTHER): Payer: 59 | Admitting: Orthopedic Surgery

## 2017-04-25 ENCOUNTER — Encounter (INDEPENDENT_AMBULATORY_CARE_PROVIDER_SITE_OTHER): Payer: Self-pay | Admitting: Orthopedic Surgery

## 2017-04-25 VITALS — BP 83/46 | HR 57 | Resp 14 | Ht 63.0 in | Wt 106.0 lb

## 2017-04-25 DIAGNOSIS — S83411D Sprain of medial collateral ligament of right knee, subsequent encounter: Secondary | ICD-10-CM

## 2017-04-25 DIAGNOSIS — M24661 Ankylosis, right knee: Secondary | ICD-10-CM
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
## 2017-04-27 ENCOUNTER — Ambulatory Visit (INDEPENDENT_AMBULATORY_CARE_PROVIDER_SITE_OTHER): Payer: 59 | Admitting: Orthopaedic Surgery

## 2017-05-05 ENCOUNTER — Inpatient Hospital Stay: Admission: RE | Admit: 2017-05-05 | Payer: 59 | Source: Ambulatory Visit

## 2017-05-05 ENCOUNTER — Other Ambulatory Visit: Payer: 59

## 2017-05-09 ENCOUNTER — Other Ambulatory Visit: Payer: 59

## 2017-05-09 ENCOUNTER — Inpatient Hospital Stay: Admission: RE | Admit: 2017-05-09 | Payer: 59 | Source: Ambulatory Visit

## 2017-05-13 ENCOUNTER — Ambulatory Visit
Admission: RE | Admit: 2017-05-13 | Discharge: 2017-05-13 | Disposition: A | Discharge status: Home / Self Care | Payer: 59 | Source: Ambulatory Visit | Attending: Orthopedic Surgery | Admitting: Orthopedic Surgery

## 2017-05-13 DIAGNOSIS — M24661 Ankylosis, right knee: Secondary | ICD-10-CM

## 2017-05-13 DIAGNOSIS — S83411D Sprain of medial collateral ligament of right knee, subsequent encounter: Secondary | ICD-10-CM

## 2017-05-21 ENCOUNTER — Encounter: Payer: Self-pay | Admitting: Physician Assistant

## 2017-05-25 ENCOUNTER — Ambulatory Visit (INDEPENDENT_AMBULATORY_CARE_PROVIDER_SITE_OTHER): Payer: 59 | Admitting: Orthopaedic Surgery

## 2017-05-25 ENCOUNTER — Encounter (INDEPENDENT_AMBULATORY_CARE_PROVIDER_SITE_OTHER): Payer: Self-pay | Admitting: Orthopaedic Surgery

## 2017-05-25 VITALS — BP 80/44 | HR 59 | Resp 16 | Ht 63.0 in | Wt 106.0 lb

## 2017-05-25 DIAGNOSIS — G8929 Other chronic pain: Secondary | ICD-10-CM

## 2017-05-25 DIAGNOSIS — M25561 Pain in right knee: Secondary | ICD-10-CM
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
## 2018-01-12 DIAGNOSIS — G4700 Insomnia, unspecified: Secondary | ICD-10-CM | POA: Insufficient documentation

## 2018-01-12 DIAGNOSIS — K5903 Drug induced constipation: Secondary | ICD-10-CM | POA: Insufficient documentation

## 2018-03-01 ENCOUNTER — Other Ambulatory Visit: Payer: Self-pay | Admitting: Physician Assistant

## 2018-03-01 ENCOUNTER — Other Ambulatory Visit: Payer: 59

## 2018-03-01 DIAGNOSIS — K219 Gastro-esophageal reflux disease without esophagitis: Secondary | ICD-10-CM

## 2018-03-01 DIAGNOSIS — R1012 Left upper quadrant pain: Secondary | ICD-10-CM

## 2018-03-01 DIAGNOSIS — R1013 Epigastric pain: Secondary | ICD-10-CM

## 2018-03-07 ENCOUNTER — Ambulatory Visit
Admission: RE | Admit: 2018-03-07 | Discharge: 2018-03-07 | Disposition: A | Discharge status: Home / Self Care | Payer: 59 | Source: Ambulatory Visit | Attending: Physician Assistant | Admitting: Physician Assistant

## 2018-03-07 DIAGNOSIS — R1012 Left upper quadrant pain: Secondary | ICD-10-CM

## 2018-03-07 DIAGNOSIS — R1013 Epigastric pain: Secondary | ICD-10-CM

## 2018-03-07 DIAGNOSIS — K219 Gastro-esophageal reflux disease without esophagitis: Secondary | ICD-10-CM

## 2018-03-11 ENCOUNTER — Ambulatory Visit: Payer: 59 | Admitting: Psychology

## 2018-03-12 ENCOUNTER — Ambulatory Visit: Payer: 59 | Admitting: Psychology

## 2018-03-12 DIAGNOSIS — F411 Generalized anxiety disorder: Secondary | ICD-10-CM | POA: Diagnosis not present

## 2018-03-18 ENCOUNTER — Ambulatory Visit: Payer: 59 | Admitting: Psychology

## 2018-03-18 DIAGNOSIS — F411 Generalized anxiety disorder: Secondary | ICD-10-CM | POA: Diagnosis not present

## 2018-03-25 ENCOUNTER — Ambulatory Visit: Payer: 59 | Admitting: Psychology

## 2018-04-08 ENCOUNTER — Ambulatory Visit: Payer: 59 | Admitting: Psychology

## 2018-10-09 ENCOUNTER — Other Ambulatory Visit: Payer: Self-pay | Admitting: *Deleted

## 2018-10-09 DIAGNOSIS — J09X2 Influenza due to identified novel influenza A virus with other respiratory manifestations: Secondary | ICD-10-CM

## 2018-10-10 LAB — NOVEL CORONAVIRUS, NAA: SARS-CoV-2, NAA: NOT DETECTED

## 2018-10-23 IMAGING — DX DG RIBS W/ CHEST 3+V*L*
4 series · 4 of 4 positions shown · non-contrast
Comparison: 05/13/2013

CLINICAL DATA: Left rib pain after exercise

EXAM:
LEFT RIBS AND CHEST - 3+ VIEW

[chest pa]
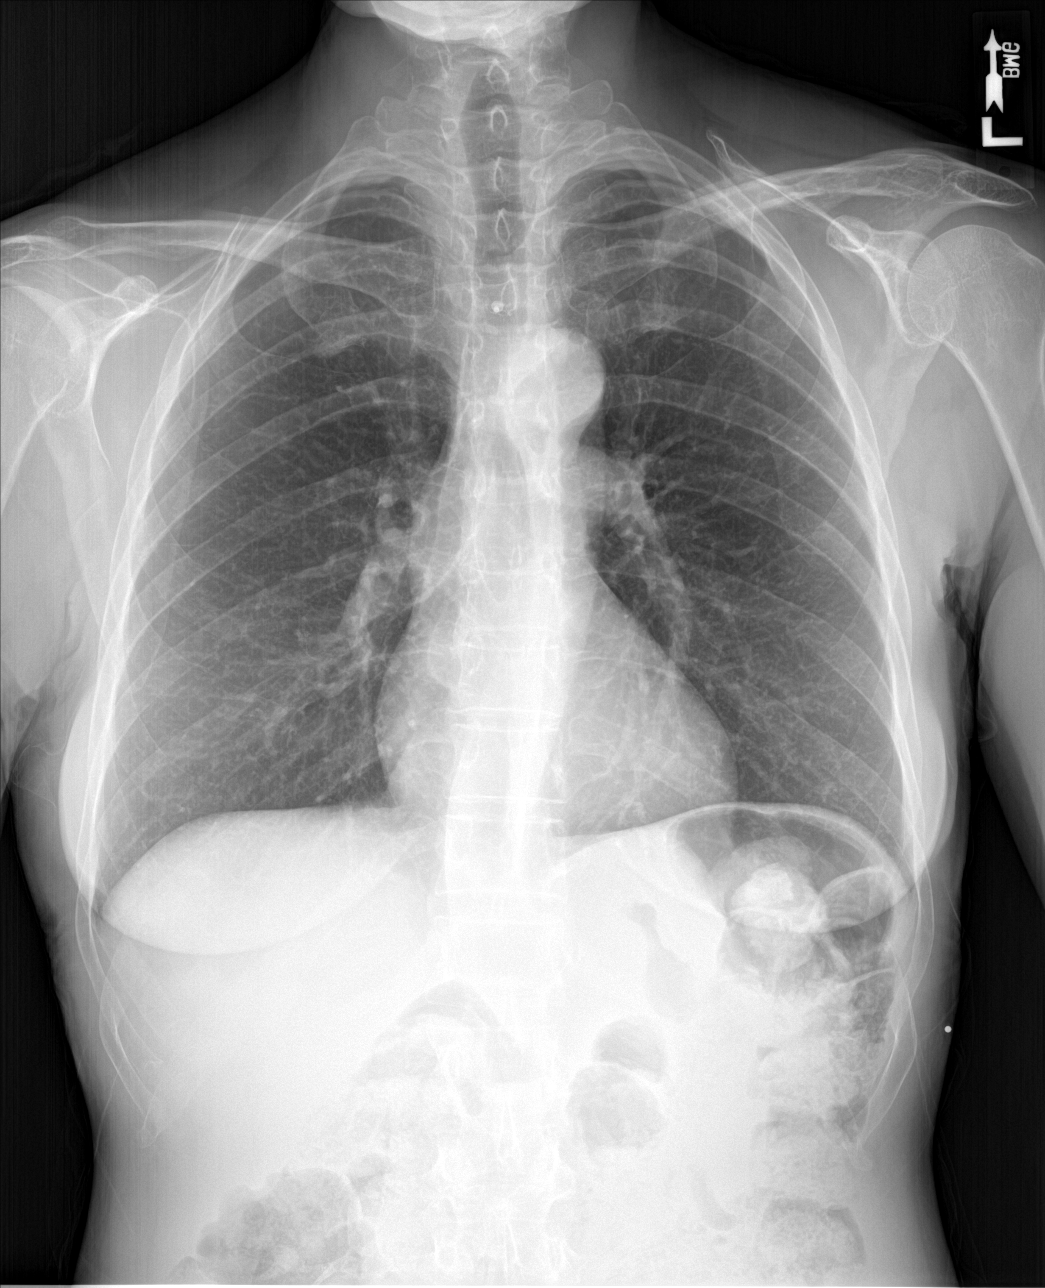

[rib obl (1 of 2)]
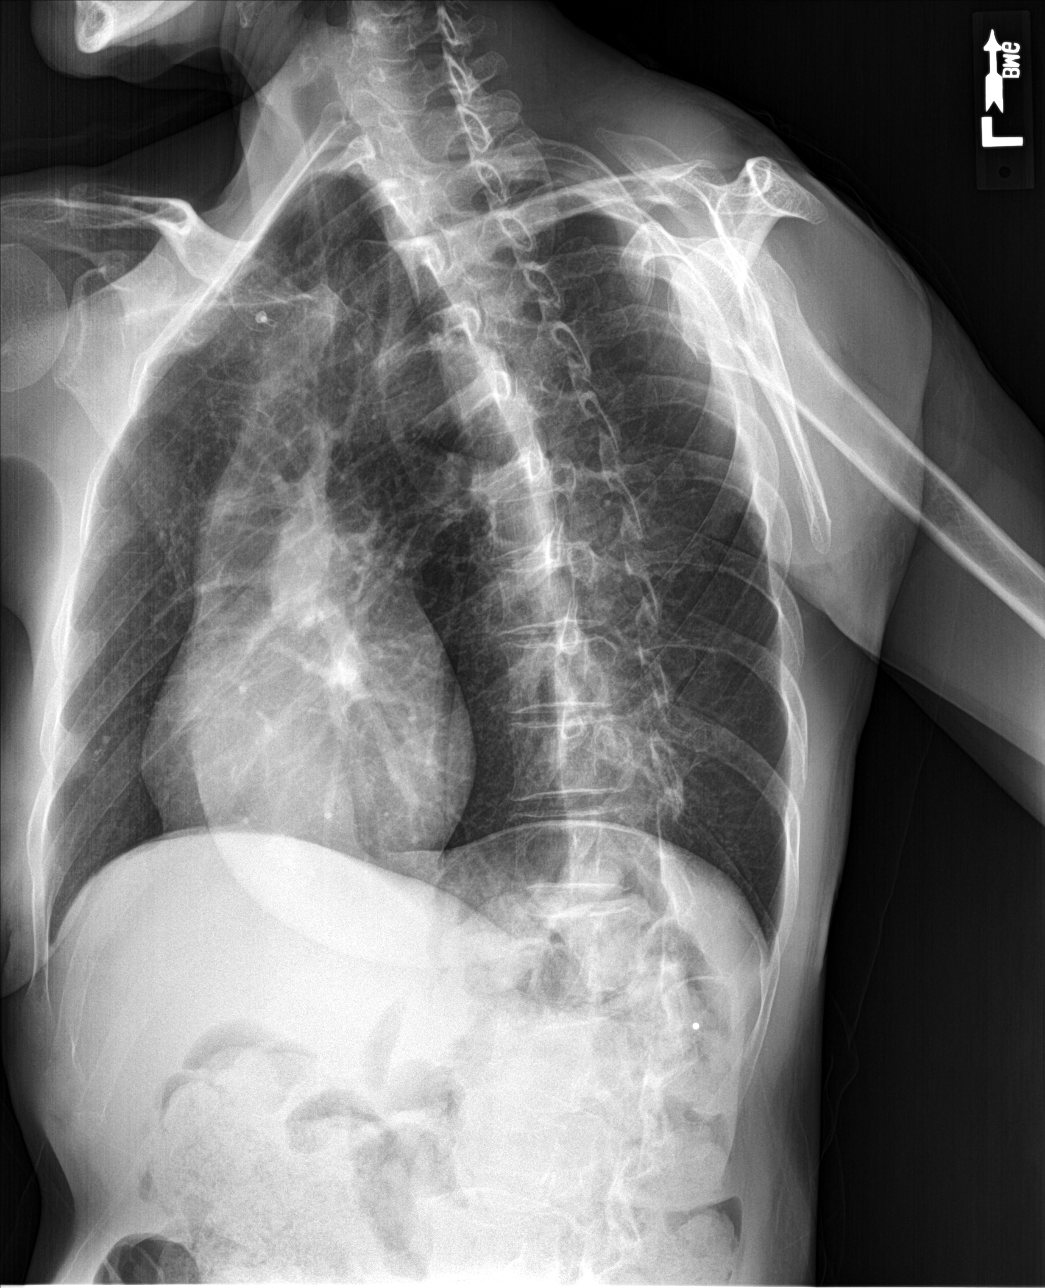

[rib obl (2 of 2)]
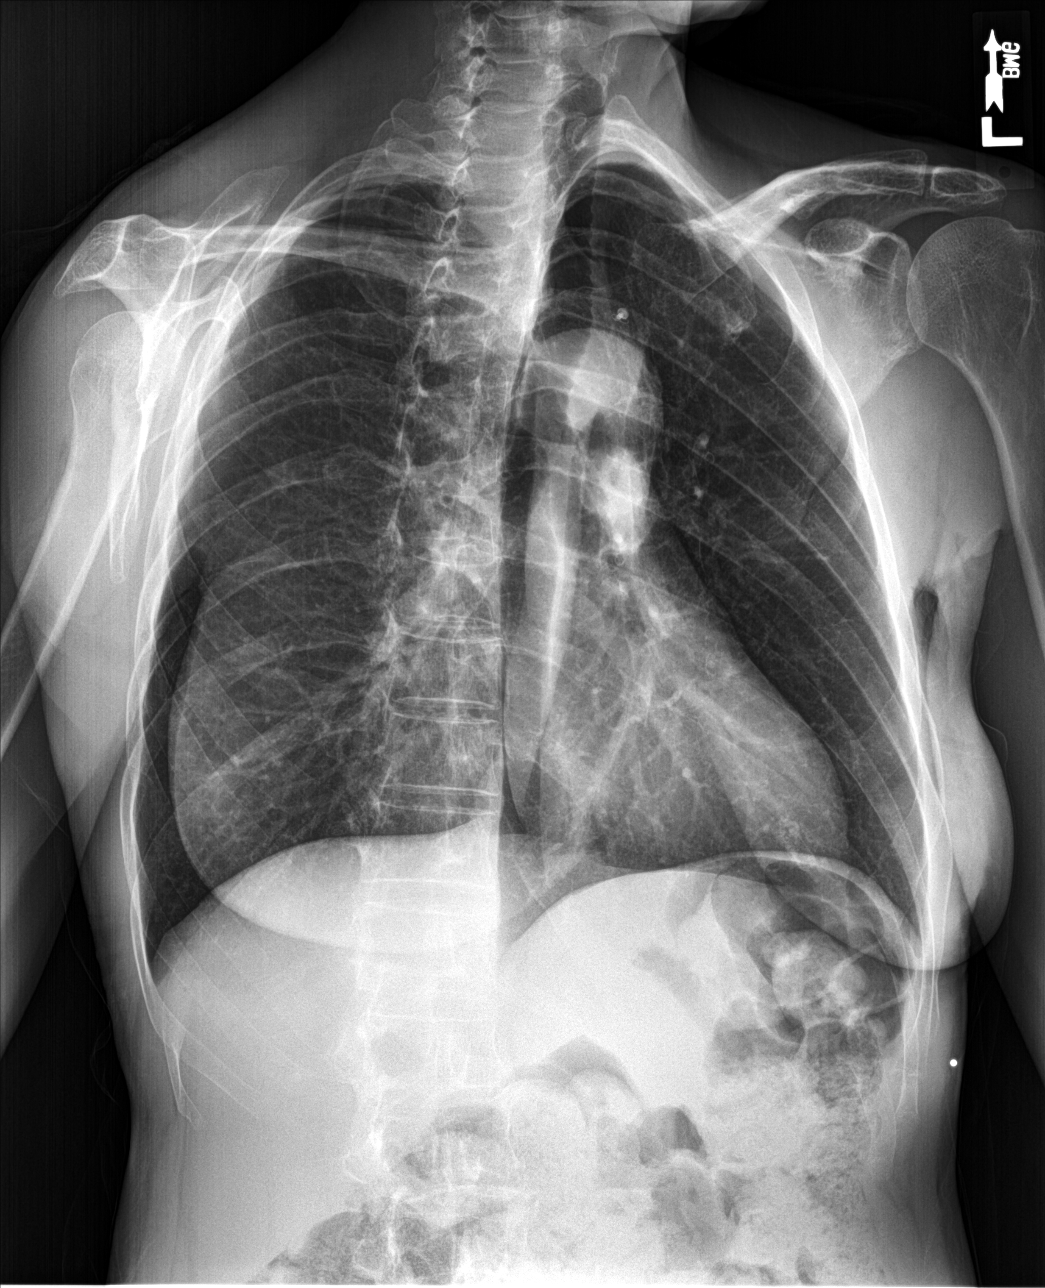

[chest ap]
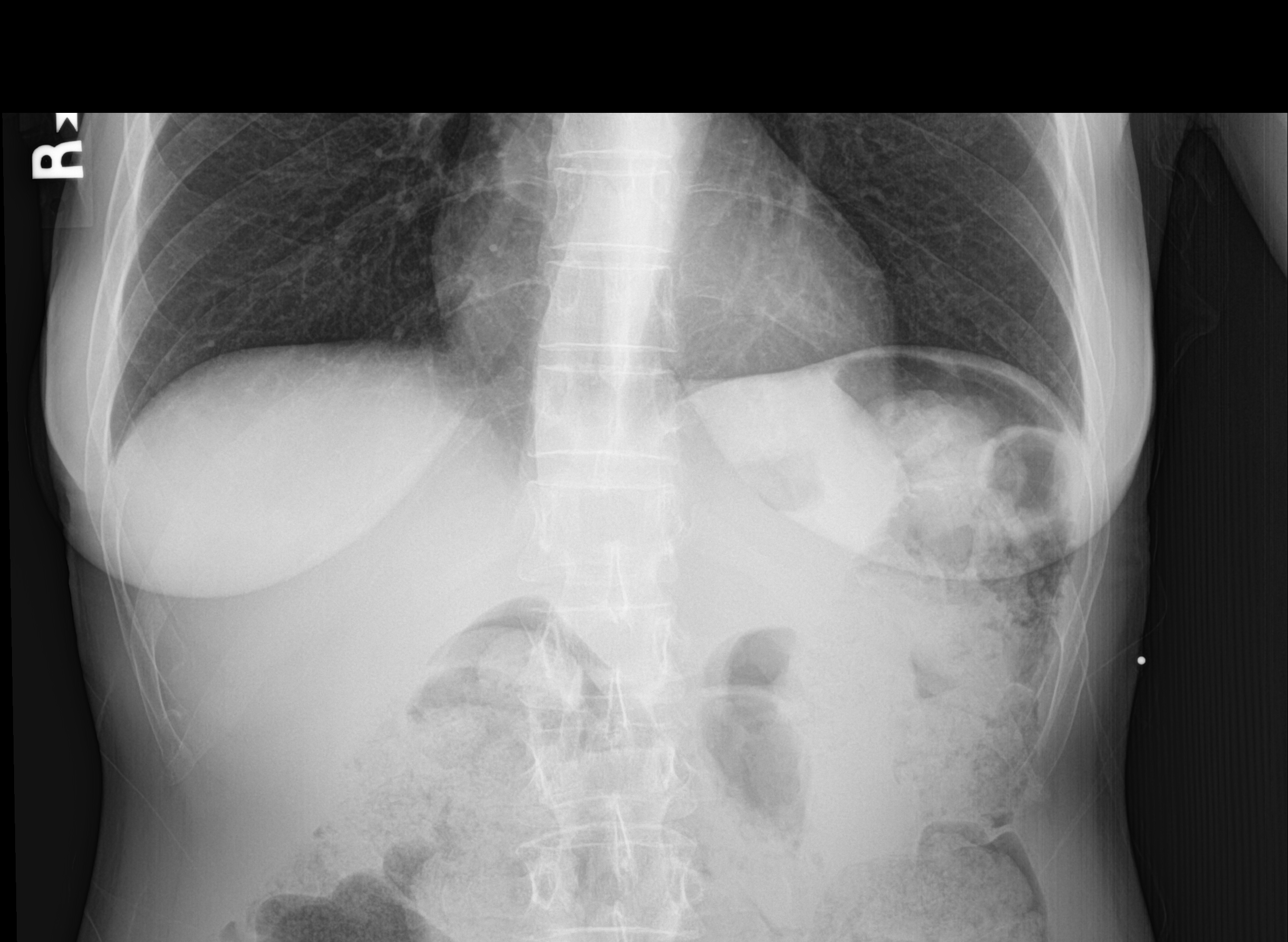

[4 of 4 positions shown; findings below may reference images not displayed]

FINDINGS: Single-view chest demonstrates no acute consolidation or effusion.
Normal heart size. No pneumothorax. Metallic opacity over the upper
mediastinum.

Left rib series demonstrates no acute displaced left rib fracture
IMPRESSION: 1. Negative for pleural effusion or pneumothorax
2. No definite acute displaced left rib fracture is seen
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
## 2018-11-22 ENCOUNTER — Other Ambulatory Visit: Payer: Self-pay

## 2018-11-22 DIAGNOSIS — Z20822 Contact with and (suspected) exposure to covid-19: Secondary | ICD-10-CM

## 2018-11-23 LAB — NOVEL CORONAVIRUS, NAA: SARS-CoV-2, NAA: NOT DETECTED

## 2018-12-16 ENCOUNTER — Other Ambulatory Visit: Payer: Self-pay

## 2018-12-16 DIAGNOSIS — Z20822 Contact with and (suspected) exposure to covid-19: Secondary | ICD-10-CM

## 2018-12-17 LAB — NOVEL CORONAVIRUS, NAA: SARS-CoV-2, NAA: NOT DETECTED

## 2018-12-18 ENCOUNTER — Telehealth: Payer: Self-pay | Admitting: Physician Assistant

## 2018-12-18 NOTE — Telephone Encounter (Signed)
Negative COVID results given. Patient results "NOT Detected." Caller expressed understanding. ° °

## 2019-01-07 ENCOUNTER — Other Ambulatory Visit: Payer: Self-pay

## 2019-01-07 DIAGNOSIS — Z20822 Contact with and (suspected) exposure to covid-19: Secondary | ICD-10-CM

## 2019-01-08 LAB — NOVEL CORONAVIRUS, NAA: SARS-CoV-2, NAA: NOT DETECTED

## 2019-01-16 ENCOUNTER — Ambulatory Visit (INDEPENDENT_AMBULATORY_CARE_PROVIDER_SITE_OTHER): Payer: Medicare Other | Admitting: Orthopaedic Surgery

## 2019-01-16 ENCOUNTER — Ambulatory Visit: Payer: Self-pay

## 2019-01-16 ENCOUNTER — Encounter: Payer: Self-pay | Admitting: Orthopaedic Surgery

## 2019-01-16 ENCOUNTER — Other Ambulatory Visit: Payer: Self-pay

## 2019-01-16 ENCOUNTER — Ambulatory Visit (INDEPENDENT_AMBULATORY_CARE_PROVIDER_SITE_OTHER): Payer: Medicare Other

## 2019-01-16 DIAGNOSIS — M25562 Pain in left knee: Secondary | ICD-10-CM

## 2019-01-16 DIAGNOSIS — M25561 Pain in right knee: Secondary | ICD-10-CM

## 2019-01-16 DIAGNOSIS — G8929 Other chronic pain: Secondary | ICD-10-CM | POA: Diagnosis not present
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
## 2019-02-05 ENCOUNTER — Other Ambulatory Visit: Payer: Self-pay

## 2019-03-10 ENCOUNTER — Ambulatory Visit (INDEPENDENT_AMBULATORY_CARE_PROVIDER_SITE_OTHER): Payer: Medicare Other | Admitting: Psychology

## 2019-03-10 DIAGNOSIS — F411 Generalized anxiety disorder: Secondary | ICD-10-CM

## 2019-03-17 ENCOUNTER — Ambulatory Visit (INDEPENDENT_AMBULATORY_CARE_PROVIDER_SITE_OTHER): Payer: Medicare Other | Admitting: Psychology

## 2019-03-17 DIAGNOSIS — F411 Generalized anxiety disorder: Secondary | ICD-10-CM | POA: Diagnosis not present

## 2019-03-24 ENCOUNTER — Ambulatory Visit: Payer: Medicare Other | Admitting: Psychology

## 2019-03-27 ENCOUNTER — Ambulatory Visit: Payer: Medicare Other | Attending: Internal Medicine

## 2019-03-27 DIAGNOSIS — Z23 Encounter for immunization: Secondary | ICD-10-CM

## 2019-03-27 NOTE — Progress Notes (Signed)
   Covid-19 Vaccination Clinic  Name:  [name]    MRN: [id number] DOB: 1955/11/19  03/27/2019  [name] was observed post Covid-19 immunization for 30 minutes based on pre-vaccination screening without incidence. She was provided with Vaccine Information Sheet and instruction to access the V-Safe system.   [name] was instructed to call 911 with any severe reactions post vaccine: Marland Kitchen Difficulty breathing  . Swelling of your face and throat  . A fast heartbeat  . A bad rash all over your body  . Dizziness and weakness    Immunizations Administered    Name Date Dose VIS Date Route   Pfizer COVID-19 Vaccine 03/27/2019 12:44 PM 0.3 mL 01/24/2019 Intramuscular   Manufacturer: [place]   Lot: [id number]   [place]: [id number]

## 2019-04-19 ENCOUNTER — Ambulatory Visit: Payer: Medicare Other | Attending: Internal Medicine

## 2019-04-19 DIAGNOSIS — Z23 Encounter for immunization: Secondary | ICD-10-CM | POA: Insufficient documentation

## 2019-04-19 NOTE — Progress Notes (Signed)
   Covid-19 Vaccination Clinic  Name:  [name]    MRN: [id number] DOB: 1955-03-31  04/19/2019  [name] was observed post Covid-19 immunization for 15 minutes without incident. She was provided with Vaccine Information Sheet and instruction to access the V-Safe system.   [name] was instructed to call 911 with any severe reactions post vaccine: Marland Kitchen Difficulty breathing  . Swelling of face and throat  . A fast heartbeat  . A bad rash all over body  . Dizziness and weakness   Immunizations Administered    Name Date Dose VIS Date Route   Pfizer COVID-19 Vaccine 04/19/2019 12:38 PM 0.3 mL 01/24/2019 Intramuscular   Manufacturer: [place]   Lot: [id number]   [place]: [id number]

## 2019-05-21 ENCOUNTER — Ambulatory Visit (INDEPENDENT_AMBULATORY_CARE_PROVIDER_SITE_OTHER): Payer: Medicare Other | Admitting: Psychology

## 2019-05-21 DIAGNOSIS — F411 Generalized anxiety disorder: Secondary | ICD-10-CM | POA: Diagnosis not present

## 2019-05-24 ENCOUNTER — Emergency Department (HOSPITAL_COMMUNITY)
Admission: EM | Admit: 2019-05-24 | Discharge: 2019-05-24 | Disposition: A | Discharge status: Home / Self Care | Payer: Medicare Other | Attending: Emergency Medicine | Admitting: Emergency Medicine

## 2019-05-24 ENCOUNTER — Encounter (HOSPITAL_COMMUNITY): Payer: Self-pay | Admitting: *Deleted

## 2019-05-24 ENCOUNTER — Emergency Department (HOSPITAL_COMMUNITY): Payer: Medicare Other

## 2019-05-24 ENCOUNTER — Other Ambulatory Visit: Payer: Self-pay

## 2019-05-24 DIAGNOSIS — R630 Anorexia: Secondary | ICD-10-CM | POA: Insufficient documentation

## 2019-05-24 DIAGNOSIS — R1012 Left upper quadrant pain: Secondary | ICD-10-CM | POA: Diagnosis present

## 2019-05-24 DIAGNOSIS — Z79899 Other long term (current) drug therapy: Secondary | ICD-10-CM | POA: Insufficient documentation

## 2019-05-24 HISTORY — DX: Bulimia nervosa: F50.2

## 2019-05-24 HISTORY — DX: Bulimia nervosa, unspecified: F50.20

## 2019-05-24 LAB — COMPREHENSIVE METABOLIC PANEL
ALT: 17 U/L (ref 0–44)
AST: 20 U/L (ref 15–41)
Albumin: 4.9 g/dL (ref 3.5–5.0)
Alkaline Phosphatase: 57 U/L (ref 38–126)
Anion gap: 10 (ref 5–15)
BUN: 21 mg/dL (ref 8–23)
CO2: 31 mmol/L (ref 22–32)
Calcium: 9.7 mg/dL (ref 8.9–10.3)
Chloride: 99 mmol/L (ref 98–111)
Creatinine, Ser: 0.75 mg/dL (ref 0.44–1.00)
GFR calc Af Amer: >60 mL/min (ref >60)
GFR calc non Af Amer: >60 mL/min (ref >60)
Glucose, Bld: 104 mg/dL — ABNORMAL HIGH (ref 70–99)
Potassium: 3.5 mmol/L (ref 3.5–5.1)
Sodium: 140 mmol/L (ref 135–145)
Total Bilirubin: 0.7 mg/dL (ref 0.3–1.2)
Total Protein: 8.1 g/dL (ref 6.5–8.1)

## 2019-05-24 LAB — URINALYSIS, ROUTINE W REFLEX MICROSCOPIC
Bacteria, UA: NONE SEEN
Bilirubin Urine: NEGATIVE
Glucose, UA: NEGATIVE mg/dL
Hgb urine dipstick: NEGATIVE
Ketones, ur: 5 mg/dL — AB
Nitrite: NEGATIVE
Protein, ur: NEGATIVE mg/dL
Specific Gravity, Urine: 1.021 (ref 1.005–1.030)
pH: 6 (ref 5.0–8.0)

## 2019-05-24 LAB — CBC WITH DIFFERENTIAL/PLATELET
Abs Immature Granulocytes: 0.01 10*3/uL (ref 0.00–0.07)
Basophils Absolute: 0 10*3/uL (ref 0.0–0.1)
Basophils Relative: 1 %
Eosinophils Absolute: 0.1 10*3/uL (ref 0.0–0.5)
Eosinophils Relative: 1 %
HCT: 44.5 % (ref 36.0–46.0)
Hemoglobin: 14.8 g/dL (ref 12.0–15.0)
Immature Granulocytes: 0 %
Lymphocytes Relative: 33 %
Lymphs Abs: 2.1 10*3/uL (ref 0.7–4.0)
MCH: 30 pg (ref 26.0–34.0)
MCHC: 33.3 g/dL (ref 30.0–36.0)
MCV: 90.1 fL (ref 80.0–100.0)
Monocytes Absolute: 0.4 10*3/uL (ref 0.1–1.0)
Monocytes Relative: 7 %
Neutro Abs: 3.9 10*3/uL (ref 1.7–7.7)
Neutrophils Relative %: 58 %
Platelets: 222 10*3/uL (ref 150–400)
RBC: 4.94 MIL/uL (ref 3.87–5.11)
RDW: 12.1 % (ref 11.5–15.5)
WBC: 6.5 10*3/uL (ref 4.0–10.5)
nRBC: 0 % (ref 0.0–0.2)

## 2019-05-24 LAB — TROPONIN I (HIGH SENSITIVITY): Troponin I (High Sensitivity): 3 ng/L (ref <18)

## 2019-05-24 LAB — LIPASE, BLOOD: Lipase: 40 U/L (ref 11–51)

## 2019-05-24 MED ORDER — PANTOPRAZOLE SODIUM 40 MG IV SOLR
40.0000 mg | Freq: Once | INTRAVENOUS | Status: AC
  Administered 2019-05-24: 40 mg via INTRAVENOUS
  Filled 2019-05-24: qty 40

## 2019-05-24 MED ORDER — SODIUM CHLORIDE (PF) 0.9 % IJ SOLN
INTRAMUSCULAR | Status: AC
  Filled 2019-05-24: qty 50

## 2019-05-24 MED ORDER — ONDANSETRON HCL 4 MG/2ML IJ SOLN
4.0000 mg | Freq: Once | INTRAMUSCULAR | Status: AC
  Administered 2019-05-24: 4 mg via INTRAVENOUS
  Filled 2019-05-24: qty 2

## 2019-05-24 MED ORDER — IOHEXOL 300 MG/ML  SOLN
100.0000 mL | Freq: Once | INTRAMUSCULAR | Status: AC | PRN
  Administered 2019-05-24: 100 mL via INTRAVENOUS

## 2019-05-24 MED ORDER — SUCRALFATE 1 GM/10ML PO SUSP: 1.0000 g | Freq: Three times a day (TID) | ORAL | 0 refills | Status: DC

## 2019-05-24 MED ORDER — SUCRALFATE 1 GM/10ML PO SUSP
1.0000 g | Freq: Three times a day (TID) | ORAL | Status: DC
  Administered 2019-05-24: 1 g via ORAL
  Filled 2019-05-24: qty 10

## 2019-05-24 MED ORDER — SODIUM CHLORIDE 0.9 % IV BOLUS
1000.0000 mL | Freq: Once | INTRAVENOUS | Status: AC
  Administered 2019-05-24: 1000 mL via INTRAVENOUS

## 2019-05-24 MED ORDER — PANTOPRAZOLE SODIUM 40 MG PO TBEC: 40.0000 mg | DELAYED_RELEASE_TABLET | Freq: Every day | ORAL | 0 refills | Status: DC

## 2019-05-24 MED ORDER — FAMOTIDINE IN NACL 20-0.9 MG/50ML-% IV SOLN
20.0000 mg | Freq: Once | INTRAVENOUS | Status: AC
  Administered 2019-05-24: 20 mg via INTRAVENOUS
  Filled 2019-05-24: qty 50

## 2019-05-24 NOTE — ED Provider Notes (Signed)
[facility] DEPT Provider Note   CSN: [id number] Arrival date & time: 05/24/19  1244     History Chief Complaint  Patient presents with  . Abdominal Pain    [name] is a 64 y.o. female.  HPI   Pt is a 64 y/o female with a h/o anemia, anorexia, anxiety, bulimia, depression, who presents to the ED today for eval of multiple complaints. States that she is suffering from severe anxiety that causes her to have abd pain and decreased appetite/PO intake. Abd pain is located to the LUQ that radiates to the left back. Pain started about 2 weeks ago and has been intermittent since onset. This current episode started yesterday. Describes pain as a gnawing pain.   She also reports a metallic taste in her mouth for months and increased belching. She also reports nausea.  She states that she has not vomited in about 2 weeks. States she had been constipated recently and took milk of magnesia 3-4 days ago which helped her symptoms. She is having some loose stools now. Denies any bloody stools. Denies urinary sxs. Denies chest pain, shortness of breath, pleuritic pain.   Denies leg pain/swelling, hemoptysis, recent surgery/trauma, recent long travel, hormone use, personal hx of cancer, or hx of DVT/PE.  Denies heavy NSAID use or heavy ETOH use. Denies h/o PUD.   Pt is currently receiving [place].   Past Medical History:  Diagnosis Date  . Acute pain of right knee 03/07/2017  . Anemia   . Anorexia   . Anxiety   . Bulimia   . Depression   . Retinitis pigmentosa     Patient Active Problem List   Diagnosis Date Noted  . Tear of MCL (medial collateral ligament) of knee, right, subsequent encounter 03/07/2017  . Eating disorder with ongoing treatment 08/27/2014  . Ocular migraine 08/27/2014  . GAD (generalized anxiety disorder) 03/13/2014  . Major depressive disorder in remission ([place]) 03/13/2014  . 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 Lower abdominal pain  12/11/2011  . 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  . Osteoporosis 09/19/2011  . Numbness and tingling 09/19/2011  . Weakness generalized 09/19/2011  . Retinitis pigmentosa 09/19/2011  . Age-related osteoporosis without current pathological fracture 09/19/2011  . 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 CESAREAN SECTION       OB History   No obstetric history on file.     Family History  Problem Relation Age of Onset  . Cancer Father   . Heart disease Father        CHF  . Diabetes Sister        Obesity  . Polymyalgia rheumatica Mother     Social History   Tobacco Use  . Smoking status: Never Smoker  . Smokeless tobacco: Never Used  Substance Use Topics  . Alcohol use: No  . Drug use: No    Home Medications Prior to Admission medications   Medication Sig Start Date End Date Taking? Authorizing Provider  aluminum-magnesium hydroxide 200-200 MG/5ML suspension Take 10 mLs by mouth every 6 (six) hours as needed for indigestion.   Yes [provider]  diazepam (VALIUM) 10 MG tablet Take 10 mg by mouth 3 (three) times daily as needed for anxiety.    Yes [provider]  escitalopram (LEXAPRO) 20 MG tablet Take 30 mg by mouth daily.   Yes [provider]  MAGNESIUM PO Take 1 tablet by mouth  daily as needed (muscle fatigue).   Yes [provider]  ONDANSETRON PO Take 1 tablet by mouth as needed (nausea).   Yes [provider]    Allergies    Carbamazepine, Prochlorperazine edisylate, Cephalexin, Amoxicillin-pot clavulanate, and Epinephrine  Review of Systems   Review of Systems  Constitutional: Negative for chills and fever.  HENT: Negative for ear pain and sore throat.   Eyes: Negative for visual disturbance.  Respiratory: Negative for cough and shortness of breath.   Cardiovascular: Negative for chest pain and leg swelling.  Gastrointestinal:  Positive for abdominal pain, constipation (resolved), diarrhea and nausea. Negative for vomiting.  Genitourinary: Negative for dysuria and hematuria.  Musculoskeletal: Negative for back pain.  Skin: Negative for rash.  Neurological: Negative for headaches.  Psychiatric/Behavioral: The patient is nervous/anxious.   All other systems reviewed and are negative.   Physical Exam Updated Vital Signs BP 111/69   Pulse (!) 52   Temp 98.3 F (36.8 C) (Oral)   Resp 13   Ht 5' 3"$  (1.6 m)   Wt 50.8 kg   LMP 02/18/2004   SpO2 98%   BMI 19.84 kg/m   Physical Exam Vitals and nursing note reviewed.  Constitutional:      General: She is not in acute distress.    Appearance: She is well-developed.  HENT:     Head: Normocephalic and atraumatic.  Eyes:     Conjunctiva/sclera: Conjunctivae normal.  Cardiovascular:     Rate and Rhythm: Normal rate and regular rhythm.     Heart sounds: Normal heart sounds. No murmur.  Pulmonary:     Effort: Pulmonary effort is normal. No respiratory distress.     Breath sounds: Normal breath sounds. No wheezing, rhonchi or rales.  Abdominal:     General: Bowel sounds are normal.     Palpations: Abdomen is soft.     Tenderness: There is no right CVA tenderness, left CVA tenderness, guarding or rebound.     Comments: No focal TTP on exam.  Musculoskeletal:     Cervical back: Neck supple.  Skin:    General: Skin is warm and dry.  Neurological:     Mental Status: She is alert.     ED Results / Procedures / Treatments   Labs (all labs ordered are listed, but only abnormal results are displayed) Labs Reviewed  COMPREHENSIVE METABOLIC PANEL - Abnormal; Notable for the following components:      Result Value   Glucose, Bld 104 (*)    All other components within normal limits  URINALYSIS, ROUTINE W REFLEX MICROSCOPIC - Abnormal; Notable for the following components:   Ketones, ur 5 (*)    Leukocytes,Ua TRACE (*)    All other components within normal  limits  CBC WITH DIFFERENTIAL/PLATELET  LIPASE, BLOOD  TROPONIN I (HIGH SENSITIVITY)    EKG None     EKG: NSR, HR 52. No arrhythmia or ischemic changes.    Radiology CT ABDOMEN PELVIS W CONTRAST  Result Date: 05/24/2019 CLINICAL DATA:  Left upper quadrant abdominal pain EXAM: CT ABDOMEN AND PELVIS WITH CONTRAST TECHNIQUE: Multidetector CT imaging of the abdomen and pelvis was performed using the standard protocol following bolus administration of intravenous contrast. CONTRAST:  169m OMNIPAQUE IOHEXOL 300 MG/ML  SOLN COMPARISON:  Abdominal ultrasound dated 03/07/2018 FINDINGS: Lower chest: Lung bases are clear. Hepatobiliary: Liver is within normal limits. Gallbladder is unremarkable. No intrahepatic or extrahepatic ductal dilatation. Pancreas: Within normal limits. Spleen: Two subcapsular subcentimeter cysts (series 2/images 8  and 16), benign. Adrenals/Urinary Tract: Adrenal glands are within normal limits. Dominant 4.9 cm cyst in the anterior left lower kidney (series 2/image 26). Two punctate nonobstructing left lower pole renal calculi (series 2/images 29 and 30). Right kidney is within normal limits. No hydronephrosis. Bladder is within normal limits. Stomach/Bowel: Stomach is within normal limits. No evidence of bowel obstruction. Normal appendix (coronal image 51). No colonic wall thickening or inflammatory changes. Vascular/Lymphatic: No evidence of abdominal aortic aneurysm. No suspicious abdominopelvic lymphadenopathy. Reproductive: Uterus is within normal limits. Bilateral ovaries are unremarkable. Other: No abdominopelvic ascites. Musculoskeletal: Visualized osseous structures are within normal limits. IMPRESSION: No evidence of bowel obstruction.  Normal appendix. Two punctate nonobstructing left lower pole renal calculi. No hydronephrosis. No CT findings to account for the patient's left upper quadrant abdominal pain. Additional ancillary findings as above. Electronically Signed   By:  Julian Hy M.D.   On: 05/24/2019 18:43   DG Chest Portable 1 View  Result Date: 05/24/2019 CLINICAL DATA:  64 year old female with left upper quadrant abdominal pain. EXAM: PORTABLE CHEST 1 VIEW COMPARISON:  Chest radiograph dated 01/12/2017 FINDINGS: The lungs are clear. There is no pleural effusion pneumothorax. The cardiac silhouette is within normal limits. No acute osseous pathology. IMPRESSION: No active disease. Electronically Signed   By: Anner Crete M.D.   On: 05/24/2019 16:53    Procedures Procedures (including critical care time)  Medications Ordered in ED Medications  sucralfate (CARAFATE) 1 GM/10ML suspension 1 g (1 g Oral Given 05/24/19 1645)  sodium chloride (PF) 0.9 % injection (has no administration in time range)  sodium chloride 0.9 % bolus 1,000 mL (0 mLs Intravenous Stopped 05/24/19 1828)  ondansetron (ZOFRAN) injection 4 mg (4 mg Intravenous Given 05/24/19 1644)  pantoprazole (PROTONIX) injection 40 mg (40 mg Intravenous Given 05/24/19 1643)  famotidine (PEPCID) IVPB 20 mg premix (0 mg Intravenous Stopped 05/24/19 1716)  iohexol (OMNIPAQUE) 300 MG/ML solution 100 mL (100 mLs Intravenous Contrast Given 05/24/19 1823)    ED Course  I have reviewed the triage vital signs and the nursing notes.  Pertinent labs & imaging results that were available during my care of the patient were reviewed by me and considered in my medical decision making (see chart for details).    MDM Rules/Calculators/A&P                      64 year old female presenting for evaluation of left upper quadrant abdominal pain that radiates to the left back.  Ongoing for 2 weeks.  Symptoms are intermittent.  It is associated with anxiety and decreased p.o. intake/appetite.  Reviewed/interpreted labs CBC is w/o leukocytosis or anemia CMP is w/o gross electrolyte derangement, normal kidney and liver function Lipase negative Trop negative UA with 5 ketones, likely from decreased PO intake.  Trace leuks and 6-10 wbc however asymptomatic, doubt UTI  EKG with NSR, no ischemic changes   CXR reviewed/interpreted - agree with radiologist, no pna, ptx, widened mediastinum or other acute abnormality.   CT abd/pelvis with no evidence of bowel obstruction.  Normal appendix. Two punctate nonobstructing left lower pole renal calculi. No hydronephrosis. No CT findings to account for the patient's left upper quadrant abdominal pain. Additional ancillary findings as above.  7:46 PM Discussed case with the pharmacist, Dorian Pod, who is in agreement with Pepcid, Protonix, and Carafate should all be safe for the patient to take and will not lower the seizure threshold.  Pt given IVF, antiemetics, and antacids. On reassessment, she  is still feeling a gnawing pain but states that it is tolerable. She has been able to tolerate PO fluids and popsicles. She has had no episodes of vomiting in the ED. Discussed possible diagnoses of gastritis vs pud. Also discussed possibility of GERD causing some sxs such as belching. Will start on PPI and Carafate. She also has Pepcid at home which I advised she can also take. She has appt with GI in 2 week and I advised her to keep her appt for f/u as she may need an EGD. Advised on specific return precautions. She and her friend at bedside voiced understanding of the plan and are comfortable with the plan. All questions were answered, pt stable for d/c.   Case discussed with Dr. Vanita Panda, supervising physician, who personally evaluated the patient and is in agreement with plan   Final Clinical Impression(s) / ED Diagnoses Final diagnoses:  LUQ abdominal pain    Rx / DC Orders ED Discharge Orders    None       Bishop Dublin 05/24/19 1954    Carmin Muskrat, MD 05/25/19 1727

## 2019-05-24 NOTE — ED Notes (Signed)
Patient reports she is getting [place] for her anxiety. Patient reports a hx of Anxiety, Bulimia, and Anorexia.

## 2019-05-24 NOTE — Discharge Instructions (Addendum)
Take Protonix and Carafate as directed. You may also take the Pepcid that you have at home.   Please follow up with your gastroenterologist as scheduled.   Please return to the ER sooner if you have any new or worsening symptoms, or if you have any of the following symptoms:  Abdominal pain that does not go away.  You have a fever.  You keep throwing up (vomiting).  The pain is felt only in portions of the abdomen. Pain in the right side could possibly be appendicitis. In an adult, pain in the left lower portion of the abdomen could be colitis or diverticulitis.  You pass bloody or black tarry stools.  There is bright red blood in the stool.  The constipation stays for more than 4 days.  There is belly (abdominal) or rectal pain.  You do not seem to be getting better.  You have any questions or concerns.

## 2019-05-24 NOTE — ED Triage Notes (Signed)
Pt complains of left abdominal pain that radiates to her back for the past 2 weeks. Pt states she has anxiety, which prevents her from eating and causes pain. Pt is being treated for anxiety with [place]. Pt has appointment with GI next week.

## 2019-05-24 NOTE — ED Notes (Signed)
Patient provided with water and a popsicle for fluid challenge

## 2019-05-28 ENCOUNTER — Ambulatory Visit (INDEPENDENT_AMBULATORY_CARE_PROVIDER_SITE_OTHER): Payer: Medicare Other | Admitting: Psychology

## 2019-05-28 DIAGNOSIS — F411 Generalized anxiety disorder: Secondary | ICD-10-CM | POA: Diagnosis not present

## 2019-05-28 DIAGNOSIS — F331 Major depressive disorder, recurrent, moderate: Secondary | ICD-10-CM | POA: Diagnosis not present

## 2019-06-01 NOTE — H&P (Signed)
 ------------------------------------------------------------------------------- Attestation signed by Lane JONELLE Pacific, MD at 06/01/19 2039 Janice Jenkins Bradshaw is independently examined by me and I have reviewed the chart.  Principal Problem:   GAD (generalized anxiety disorder) Active Problems:   Moderate episode of recurrent major depressive disorder (*)   Bulimia nervosa   Borderline personality disorder (*)   She is depressed, anxious, and at risk of self-harm. She needs inpatient treatment for crisis stabilization and adjustment of medications.  Pt says she is anxious. She has been on valium for many yrs. She has had anxiety and depression since her 67s. She is most recently receiving TMS.   Pt has been compliant with her meds, she has been on high doses of Lexapro which hasnt helped, she believes TMS is not helping much either. Pt says she is with her female friend but there is conflict between them where they do not want to be sexually active. Therefore it is causing increased anxiety for pt.  Advised Treatment plan:   -  Obtain collateral information from family and outpatient providers. -  Ensure safety: close observation with 15 min checks is in place; will consider 1:1 if the patient's condition worsens. -  Medication management: as indicated and PRNs as necessary.  -  Psychotherapy: individual; group; milieu when the patient is able to participate. -  Safe discharge plan.    I have reviewed the note and discussed treatment and care plan and at this time I am in agreement with the documentation.  I certify that the patient continues to need, on a daily basis, active treatment furnished directly by or requiring the supervision of inpatient psychiatric facility personnel.  Electronically Signed: Lane JONELLE Pacific, MD 06/01/2019 8:37 PM  -------------------------------------------------------------------------------  [facility] - H&P    Date of Admission: 05/31/2019 Attending Provider:  Lane JONELLE Pacific, MD Code Status:  Full Code History Source:  patient and past medical records Record Review: extensive Assessment   Hospital Diagnoses: Principal Problem:   GAD (generalized anxiety disorder) Active Problems:   Moderate episode of recurrent major depressive disorder (*)   Bulimia nervosa   Borderline personality disorder (*)  Reason(s) for Admission: has severe agitation, anxiety, depression and/or insomnia and inability to maintain adequate nutrition or self-care due to psychiatric disorder and family/community cannot provide support for essential care  Arrived at the hospital via friend - Nathanel Ket.  Based on risk assessment and clinical exam, patient is at risk for: acute suicide or self-harm risk (High ), acute self-care deficit risk (High  and meets criteria for voluntary hospitalization.  Individualized risk factors include: cannot contract for safety, has severe agitation, anxiety, depression and/or insomnia, chronic and persistent mental illness, hopeless, helpless, or worthless and social isolation.  Individualized protective factors include: patient has treatable psychiatric disorders and symptoms, moving patient to higher level of care, feels supported, no access to firearms, optimism that change can occur and future oriented.  Patient's strengths include: stable housing, stable social supports, employed, Advertising copywriter, access to reliable transportation, high school/GED graduate, strong support system, good access to care, previous compliance with treatment, has hope for a better future and believes they can get help.  The patient does NOT have a legal guardian or HCPOA.  Formulation and MDM: [name] is a 64 y.o. 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 During the assessment in the ED the patient was noted to be tearful, anxious, tense, and overwhelmed. Lab review revealed: UDS + Benzodiazepine (has Rx). BAL negative SAR-COV-2 negative. Urinalysis: cloudy, protein (20 mg/dL). Yeast (few) and mucus (rare). The patient was transferred to the Inpatient Adult Psychiatric Service for further evaluation and management   Treatment Plan   Precautions:  - DTs/withdrawal Goals and Interventions:  - Group and milieu therapy with family/outpatient support meeting as tolerated. Treatment plan focuses on modifiable risk factors and includes: level of care coordination and recommendations, risks/benefits/alternatives to treatment including common side effects and black box warnings on medication, risk factor reduction, safety goals/plan, importance of compliance with chosen treatment and firearms access reduction plan.             - Other issues to be addressed:  safety planning, housing, aftercare, addiction, medication compliance, ability to attend scheduled appointments, titration of medication with maximized efficacy, consideration of adjunct medications and/or ECT.               - Treatment goals will address insight and acceptance of mental illness, coping skills, communication skills, mood, anxiety, sleep and appetite, suicidal or homicidal ideation, psychosis, self-injurious behaviors, aggression, improving independence, improving social relationships,  domestic violence, and other issues as appropriate.    Disposition:  - voluntary psychiatric hospitalization recommended for risk of self injury and severe agitation/assaultive and continued psychiatric hospitalization justified due to high probability of danger if discharged with imminent rehospitalization likely  Pertinent Labs:             - Reviewed  HgbA1c, Lipid profile, RPR, TSH, Free T4, Vitamin B12 & Folate, and VItamin D  25-OH results are pending  -  UDS + Benzodiazepine (has Rx). BAL negative SAR-COV-2 negative. Urinalysis: cloudy, protein (20 mg/dL). Yeast (few) and mucus (rare)  ECG:             - Obtain baseline ECG  Imaging:             - None  Medications: Psych Meds: Increase Clonazepam  to 1 mg (one) BID for anxiety Lexapro 20 mg daily for depression and anxiety  Continue Trazodone 50 mg at bedtime for insomnia  Medical Recs: Continue Multivitamin 1 (one) tablet  Due monitoring for purging due to her eating disorder.  Continue to limit PRN due to history of laxative misuse              clonazePAM , haloperidol **OR** haloperidol lactate   Consults:  - nutrition  Estimated duration of hospitalization: 7 - 10 days I certify that the patient does need, on a daily basis, active treatment furnished directly by or requiring the supervision of inpatient psychiatric facility personnel.  For inpatient care, physician will direct treatment team plan within three days of admission and at least weekly thereafter.   - I have discussed the case with Venkata R Chivukula, MD, who has assisted in the formulation of the assessment and plan.  CC & HPI   CC: I need to get my anxiety under control  Debbora Ang is a 64 y.o. female who has a previous  history of Bulimia Nervosa, MDD, Borderline Personality Disorder, and GAD who presented to the ED for worsening in her depression and anxiety over the past several week  Documentation below is excerpted from Behavior Health  Assess Screening note on 06/01/2019: 64 year old female with history of anxiety and eating disorder presenting at [facility] via private vehicle with report of worsening anxiety over the past 2 weeks.  Patient states that she is unable to eat, cannot think straight, and has had night sweats and disorganized sleep.  She describes pins and needles sensations, racing thoughts, and panic episodes throughout the day.  On interview the patient is tearful, anxious, tense, and overwhelmed.  She reports an inability to work for the past week and limited food consumption. Recent stressors include the break-up of friendship last week; however, patient endorses symptoms prior to the break-up.  Patient denies SI, HI, SIB, or Psychosis.  No substance use is reported.  Patient's friend [name] Ket:  [phone]) is present at the time of assessment and expresses concern noting patient has communicated that she does not feel safe in her home.  Friend notes patient has not been eating for the past 2 weeks and has lost an undetermined amount of weight.  Friend states that the patient was taken to the ED last week for treatment of gastro pains in response to her diminished appetite and food intake.  Patient states that she is currently receiving outpatient psychiatric treatment with Dr. Kohl at [facility] and reports recent medication changes.  She reports a past history of engagement in IOP and inpatient psychiatric treatment as related to her eating disorders.  Friend states that she questions the patient's ability to driver herself to therapy appointments at this time.  Documentation below is as excerpted from Telepsychiatry Consultation note by Arcelia ONEIDA Pancoast, PA-C from the ED at [facility] on4/17/2021  Initial Interview Currently on interview the patient is alert and oriented X 4, eye contact is good, and she is noted to be fidgety, anxious, irritable, and intermittently tearful. She reports that she struggles with her  eating disorder - denies recent history of purging or laxative use as well as a long history of depression and anxiety since I was a teenager and denies a history of alcohol and illicit substance use. The patient reports worsening in her depression and anxiety, anhedonia, distractibility, extreme anxiousness, insomnia, fatigue, concentration difficulty and racing thoughts, night sweats, and decline in appetite with weight loss over the past 2 - 3 weeks.  Contributing factors include psychosocial and relationship stressors (her long time friends abruptly ended the relationship and strained relationship with her daughter). She reports her current medication management is provided by Dr. Kohl at with [facility]. She reports a history of numerous medication trials including, Lexapro, Abilify, Lithium, Sertraline , Prozac, Buspar, Valium, Ativan, Klonopin , Melatonin, Effexor, and Celexa.  A review of her records indicates Valium (20+ years of treatment with this medication) a prescription for Klonopin  0.5 mg BID (#60) was provided on 4/14. The patient reports a significant increase in her anxiety and worsening in her depression, difficult concentration, diaphoresis and numbing in hand and feet since that time. She reports a significant relief in her symptoms with the one time dose of Valium administered last night and expressed concern for seizure related benzodiazepine withdrawals. The patient reports prior CBT, EMDR, and TMS treatments have not been effective. She express no interest in ECT treatment at this time. Her goal for this admission is effective medication management  of her anxiety symptoms I need to get my anxiety under control so I can get back to lift.   Current suicidal/homicidal ideations:  Can contract for safety inside of hospital, Denies all and endorses significant anxiety Current auditory/visual hallucinations: Denies hallucinations and does not appear to be responding to internal  stimuli Current delusions: Denies Current paranoia:  Denies Current withdrawal symptoms necessitating inpatient detox:  None Sleep:  difficulty falling asleep and difficulty maintaining sleep Appetite: Weight loss of 10 pounds, since September 16, 2018 (Wt 122 lbs (55.3 kg). BMI 21.3 Hygiene:  clean, casually dressed and endorses decline in ADL's at home over the past several weeks History of falls in the last 12 months: No History of head injury: No  Past Psychiatric History   Previous psychiatric diagnosis: Bulimia Nervosa, MDD, Borderline Personality Disorder, and GAD Previous psychiatric medication trials: numerous medication trials, including, but not limited to Lexapro, Abilify, Lithium, Sertraline , Prozac, Buspar, Valium, Ativan, Klonopin , Melatonin, Effexor, and Celexa  Past suicidal/homicidal ideation/attempt: Denies, but review indicates no attempts, but endorsed thought of death while admitted in 09-Dec-2016Current psychiatric provider: Dr. Trena at [facility]  Past psychiatric provider:  Psychiatric care while [place] Previous psychiatric hospitalizations/Rehab: 01/03/2015 - 01/14/2018 [facility] for 12 days for medical stabilization and complications related to severe malnutrition. Patient has numerous previous IP admissions in [place] , [place] , and [place]  related to her mood and eating disorder as well as IOP treatment. EMDR, CBT, and TMC (6 treatments) have also been ineffective.   Past Medical History   Past Medical History:  Diagnosis Date  . Anemia   . Anxiety   . Depression   . Eating disorder   . Myelodysplasia (myelodysplastic syndrome) (*) 09/19/2011   Overview:  Diagnosed in 2007. Evaluated by hematologist at [facility]. 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 Resolved after receiving multiple IV copper effusions.  Diagnosed in 2007. Evaluated by hematologist at [facility]. Myelodysphasia secondary to severe copper  deficiency. Resolved after receiving multiple IV copper effusions and eliminating mustard from her diet.  . Osteoporosis 07/15/2014   Right Hip and Spine   . Retinitis pigmentosa   . Right knee meniscal tear 03/07/2017   Substance Use History (Over the past 12 months)   The [place]  controlled substance database query was performed in the [place] reviewing patient's 48 month history on 06/01/2019 and the results are listed at the end of this document  Marijuana: Denies Cocaine: Denies Methamphetamine: Denies Prescription stimulants:  Denies Opiates: Denies Benzodiazepines: last use: daily, taken as prescribed. Last Rx on 4/14 Clonazepam  0.5 mg BID #60 ( 30-day supply).  Valium 10 mg BID last Rx 04/29/19 # 60 (30-day supply) Alcohol: Denies LSD: Denies PCP:  Denies Inhalants: Denies Synthetic marijuana: Denies Other illicit drug usage: Denies  Patient denies all other substance use except for what is listed above.  History of substance/alcohol abuse treatment: No  The patient was counseled on the dangers of alcohol/substance use and practical counseling included:: Concern for unhealthy drinking, alcohol linked to health risk, and advised to abstain; and concern for any substance use, substances linked to health risk and mental health risk, and advised to abstain.   See [facility] staff evaluations & assessments for further details.  Findings to be discussed by team and integrated into treatment plan as indicated.  Tobacco Use Screening and Recommendation   Tobacco use 30 days prior to admission?  No: Type of tobacco used -: N/A  The patient was counseled on the dangers of tobacco use and practical counseling included: recognizing dangerous situations, development of coping skills, and basic info about quitting.  Reviewed strategies to maximize success, including removing cigarettes and smoking materials from environment, stress management, substitution of other forms of reinforcement  and support of family/friends, written materials and pharmacotherapy including nicotine replacement therapy (patches, gum and lozenges) and/or varenicline.  FDA-approved cessation medication offered/received: N/A  Social and Family History   A review of the [place] Department of Loss adjuster, chartered Offender Database was reviewed on 06/01/2019 and the results were negative  Marital:  married , once and divorced Children: 1 girl(s). Currently relocating to [place] .  Raised by: her parents. She has a sister (died from complication of diabetes) and 2 brother (younger brother died from drug overdose) Education:  Scientist, product/process development college Lives alone in [place] Trauma Hx: denies Military Hx: no service Legal history: Denies legal consequences Developmental: none  Family Psych Hx: Brother with history of substance use.  Employment Hx: employed at [employer] to firearms: No  Social History   Socioeconomic History  . Marital status: Divorced    Spouse name: Not on file  . Number of children: Not on file  . Years of education: Not on file  . Highest education level: Not on file  Occupational History  . Occupation: Scientist, product/process development education     Comment: [employer]  . Occupation: Consulting civil engineer   . Occupation: Restaurant manager, fast food   . 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 Smoking status: Former Smoker    Packs/day: 0.00    Types: Cigarettes    Quit date: 1978    Years since quitting: 43.3  . Smokeless tobacco: Never Used  Substance and Sexual Activity  . Alcohol use: No    Alcohol/week: 0.0 standard drinks  . Drug use: No  . Sexual activity: Never  Other Topics Concern  . Not on file  Social History Narrative   Lives alone.   Mother lives locally.   Social Determinants of Health   Financial Resource Strain:   . Difficulty of Paying Living Expenses:   Food Insecurity:   . Worried About Patent examiner in the Last Year:   . Barista in the Last Year:   Transportation Needs:   . Freight forwarder (Medical):   SABRA Lack of Transportation (Non-Medical):   Physical Activity:   . Days of Exercise per Week:   . Minutes of Exercise per Session:   Stress:   . Feeling of Stress :   Social Connections:   . Frequency of Communication with Friends and Family:   . Frequency of Social Gatherings with Friends and Family:   . Attends Religious Services:   . Active Member of Clubs or Organizations:   . Attends Banker Meetings:   SABRA Marital Status:   Intimate Partner Violence:   . Fear of Current or Ex-Partner:   . Emotionally Abused:   SABRA Physically Abused:   . Sexually Abused:    Family History  Problem Relation Age of Onset  . Anxiety disorder Mother   . Hypertension Mother   . Polymyalgia rheumatica Mother   . Heart failure Father   . Diabetes Sister   . Obesity Sister    Evaluation   Medications: . clonazePAM   0.5 mg Oral BID  . escitalopram oxalate  30 mg  Oral Daily  . THERA  1 tablet Oral Daily  . traZODone  50 mg Oral HS   clonazePAM , haloperidol **OR** haloperidol lactate  Allergies: Allergies  Allergen Reactions  . Epitol [Carbamazepine] Palpitations  . Other Other and Unknown    Muscle tremors Other reaction(s): Other  . Cephalexin Other    Thrush in mouth  . Compazine Nervous  . Epinephrine  Other  . Keflex Rash  . Prochlorperazine Other    Uncontrollable tremors  . Augmentin Diarrhea   Vitals:  Vitals:   06/01/19 0608  BP: 107/67  Pulse: 65  Resp: 17  Temp: 97.6 F (36.4 C)  SpO2: 98%    Labs were reviewed and included: Lab Results  Component Value Date   WBC 5.8 05/31/2019   HGB 14.1 05/31/2019   HCT 42.9 05/31/2019   Plt Ct 199 05/31/2019   ALT 9 05/31/2019   AST 14 05/31/2019   Na 141 05/31/2019   Potassium 4.2 05/31/2019   Cl 99 05/31/2019   Creatinine 0.60 05/31/2019   BUN 19 05/31/2019   CO2 31 05/31/2019    Phosphorus 3.9 04/19/2018   TSH 1.680 12/14/2017   Glucose 120 (H) 05/31/2019   No results found for: PHENYTOIN, PHENOBARB, VALPROATE, CBMZ, LITHIUM Lab Results  Component Value Date   Vitamin B-12 825 12/14/2017   Hepatitis C Virus Ab <0.1 12/14/2017   Recent Results (from the past 168 hour(s))  UR Drugs of Abuse Screen   Collection Time: 05/31/19 11:52 AM  Result Value Ref Range   Ur PH DOA Scr 8.5 4.5 - 9.0   Amphet Scr Negative Negative   Barb Scr Negative Negative   Benzo Scr Positive (A) Negative   Cannab Scr Negative Negative   Cocaine Scr Negative Negative   Opiates Scr Negative Negative   Meth Scr Negative Negative   Oxyco Scr Negative Negative   Reviewed Last EKG on File No results found.  Imaging on File (last 24 hours) No results found.  Metabolic Screening: Team to review results with patient prior to discharge as applicable (e.g. if patient on antipsychotics at discharge)  BMI: Estimated body mass index is 19.84 kg/m as calculated from the following:   Height as of this encounter: 5' 3 (1.6 m).   Weight as of this encounter: 112 lb (50.8 kg).  Labs from EMR: Lab Results  Component Value Date   Glucose 120 (H) 05/31/2019   No results found for: HGBA1C, CHOL, TRIG, HDL, LDL, VLDL, CHOLHDL, TSH  Review of Systems  Constitutional: Positive for malaise/fatigue and weight loss.       Wt 122 lbs (55.3 kg). BMI 21.3 (09/16/2018)   Gastrointestinal: Positive for abdominal pain and constipation.  Musculoskeletal: Positive for joint pain.  Skin: Negative for rash.  Neurological: Positive for tingling and headaches. Negative for seizures.  Psychiatric/Behavioral: Positive for depression. Negative for hallucinations, substance abuse and suicidal ideas. The patient is nervous/anxious and has insomnia.   All other systems reviewed and are negative.  Physical Exam Vitals and nursing note reviewed.  Constitutional:      General: She is irritable. She is not  in acute distress.    Appearance: Normal appearance. She is well-groomed.  HENT:     Head: Normocephalic and atraumatic.     Right Ear: Ear canal normal.     Left Ear: Ear canal normal.     Nose: Nose normal.     Mouth/Throat:     Mouth: Mucous membranes are dry.  Eyes:  Extraocular Movements: Extraocular movements intact.  Cardiovascular:     Pulses: Normal pulses.  Pulmonary:     Effort: Pulmonary effort is normal.  Abdominal:     General: Abdomen is flat.  Musculoskeletal:        General: Normal range of motion.  Skin:    General: Skin is warm.     Capillary Refill: Capillary refill takes less than 2 seconds.     Findings: No rash.  Neurological:     General: No focal deficit present.     Mental Status: She is alert.     Gait: Gait is intact.     Primitive Reflexes: Tone normal.  Psychiatric:        Attention and Perception: Attention normal.        Mood and Affect: Mood is anxious and depressed. Affect is tearful.        Speech: Speech normal.        Behavior: Behavior is cooperative.        Thought Content: Thought content normal.    Mental Status Evaluation  Psychological Exam: Constitutional:    Eye Contact: good eye contact    General Appearance: well-groomed, street clothes and appears younger than age    Behavior: cooperative and pleasant    Her behavior is tearful and irritable.   Musculoskeletal:    Gait: gait normal     Assistive devices: none     Muscle tone: tone normal   Psychiatric:    Psychomotor activity: restless     Speech: speech is normal     Mood: anxious, depressed and irritable     Affect: mood-congruent     Thought process: well organized, goal directed and logical    Associations: grossly intact   Thought content / Perceptual disturbances:   She has no evidence of suicidal ideation, intent, or plan and no evidence of homicidal ideation, intent, or plan.     Delusions: no delusions     Hallucinations: no hallucinations  Cognition  / Sensorium:   Orientation: oriented to person, place, time and situation     Memory intact: Yes     Attention intact: Yes    Language intact: Yes     Fund of knowledge intact: Yes     Insight:  mildly impaired     Judgement:  poor    Measurement Based Care Review   Current PHQ2(9)/CIWA/AUDIT/COWS   (RETIRED) CIWA-Ar BP: 107/67 (06/01/19 0608) Heart Rate: 65 (06/01/19 0608)   Independently review/perform testing/interpretation:  PHQ-9: 24, extremely difficult   Measurement Based Care Review: PHQ9    Depression Screen 05/31/2019 05/31/2019 02/18/2018   Please choose the category that best describes the patient's current state - - 2   Not eligible on the basis of - - Not applicable   1. Little interest or pleasure in doing things - - 3   2. Feeling down, depressed, or hopeless - - 3   PHQ-2 Total Score - - 6   3. Trouble falling or staying asleep - - 3   4. Feeling tired or having little energy - - 3   5. Poor appetite or overeating - - 3   8. Moving or speaking so slowly that other people could have noticed.  Or the opposite - being so fidgety or restless that you have been moving around a lot more than usual. - - 0   10. How difficult have these problems made it for you to do your work, take care of  things at home or get along with other people? - - Very difficult   Wish to be Dead: No No -   Suicidal Thoughts: No No -   Suicide Behavior Question: No No -   C-SSRS Screening Result No Risk No Risk -   Is patient eligible for follow-up plan for clinical depression? - - Y   Type of intervention recommended: - - Referral to Psychiatric services;Medication therapy       GAD7 Review    Generalized Anxiety Disorder 7 item (GAD-7) 02/18/2018   1. Feeling nervous, anxious, or on the edge 3   2. Not being able to stop or control worrying 3   3. Worrying too much about different things 3   4. Trouble relaxing 3   5. Being so restless that it's hard to sit still 3   7. Feeling afraid as  if something awful might happen 3      Other Findings:  The [place]  controlled substance database query was performed in the [facility] reviewing patient's 48 month history on 06/01/2019 and the results are listed below  [name] DOB: [birth year]  Sex: F  Report Prepared: 06/01/2019 Date Range: 06/01/2017 - 06/01/2019  Summary Narcotics * (excluding buprenorphine) Sedatives Buprenorphine Total Prescriptions: 24 Total Prescribers: 3 Total Pharmacies: 1  Prescriptions Fill Date ID Written Drug Qty Days Prescriber Rx # Pharmacy Refill Daily Dose * Pymt Type PMP 05/28/2019 1 05/28/2019 Clonazepam  0.5 Mg Tablet 60.00 60 Gi Kne [id number] Har (1610) 0/0 1.00 [facility] [place] 04/29/2019 1 04/29/2019 Diazepam 10 Mg Tablet 60.00 30 Ch Jef [id number] Har (1610) 0/0 2.00 [facility] [place] 03/31/2019 1 03/31/2019 Diazepam 10 Mg Tablet 60.00 30 Ch Jef [id number] Har (1610) 0/0 2.00 [facility] [place] 03/12/2019 1 03/06/2019 Diazepam 10 Mg Tablet 48.00 24 Ch Jef [id number] Har (1610) 0/0 2.00 [facility] Comm [facility] [place] 03/10/2019 1 03/06/2019 Diazepam 10 Mg Tablet 48.00 24 Ch Jef [id number] Har (1610) 0/0 2.00 [facility] Comm Ins [place] 03/07/2019 1 03/06/2019 Diazepam 10 Mg Tablet 12.00 6 Ch Jef [id number] Har (1610) 0/0 2.00 [facility] Comm Ins [place] 01/22/2019 1 09/04/2018 Diazepam 10 Mg Tablet 90.00 30 Sp Sim [id number] Har (1610) 3/3 3.00 [facility] Comm Ins [place] 12/12/2018 1 09/04/2018 Diazepam 10 Mg Tablet 90.00 30 Sp Sim [id number] Har (1610) 2/3 3.00 LME Comm Ins [place] 10/31/2018 1 09/04/2018 Diazepam 10 Mg Tablet 90.00 30 Sp Sim [id number] Har (1610) 1/3 3.00 L[facility] Comm Ins [place] 09/19/2018 1 09/04/2018 Diazepam 10 Mg Tablet 90.00 30 Sp Sim [id number] Har (1610) 0/3 3.00 L[facility] Comm Ins [place] 08/25/2018 1 07/24/2018 Diazepam 10 Mg Tablet 60.00 30 Sp Sim [id number] Har (1610) 1/3 2.00 LME Comm Ins [place] 07/24/2018 1 07/24/2018 Diazepam 10 Mg Tablet 60.00 30 Sp Sim [id number] Har (1610) 0/3 2.00 L[facility] Comm Ins [place] 06/22/2018 1 02/20/2018 Diazepam 10 Mg Tablet 60.00 30 Sp Sim [id number]  Har (1610) 3/3 2.00 LME Comm Ins [place] 05/09/2018 1 02/20/2018 Diazepam 10 Mg Tablet 60.00 30 Sp Sim [id number] Har (1610) 2/3 2.00 LME Comm Ins [place] 04/05/2018 1 02/20/2018 Diazepam 10 Mg Tablet 60.00 30 Sp Sim [id number] Har (1610) 1/3 2.00 LME Comm Ins [place] 03/01/2018 1 02/20/2018 Diazepam 10 Mg Tablet 60.00 30 Sp Sim [id number] Har (1610) 0/3 2.00 LME Comm Ins   Fill Date ID Written Drug Qty Days Prescriber Rx # Pharmacy Refill Daily Dose * Pymt Type PMP 02/17/2018 1 12/14/2017 Diazepam 10 Mg Tablet 30.00 30 Sp Sim [id number] Har (1610) 1/5 1.00  [facility] Comm [facility] [place] 12/23/2017 1 12/14/2017 Diazepam 10 Mg Tablet 30.00 30 Sp Sim [id number] Har (1610) 0/5 1.00 LME Comm Ins [place] 11/19/2017 1 11/19/2017 Diazepam 10 Mg Tablet 30.00 30 Sp Sim [id number] Har (1610) 0/0 1.00 L[facility] Comm Ins [place] 10/21/2017 1 09/21/2017 Diazepam 10 Mg Tablet 30.00 30 Sp Sim [id number] Har (1610) 1/2 1.00 L[facility] Comm Ins [place] 09/22/2017 1 09/21/2017 Diazepam 10 Mg Tablet 30.00 30 Sp Sim [id number] Har (1610) 0/2 1.00 LME Comm Ins [place] 08/22/2017 1 08/22/2017 Diazepam 10 Mg Tablet 30.00 30 Sp Sim [id number] Har (1610) 0/0 1.00 LME Comm Ins [place] 07/24/2017 1 06/04/2017 Diazepam 10 Mg Tablet 30.00 30 Sp Sim [id number] Har (1610) 0/0 1.00 LME Comm Ins [place] 06/22/2017 1 06/18/2017 Diazepam 10 Mg Tablet 30.00 30 Sp Sim [id number] Har (1610) 0/0 1.00 LME Comm Ins   Prescribers Name Address [facility] Zipcode Phone Jacques Camellia Naegeli, [place] [address] [place] [address] - Bernita Barbra Ned, Pa-C [address] [place] [address] - Letha FORBES Mardi Trena, [place] [address] [place] [address] -  Pharmacies Name Address [facility] Zipcode Phone [facility] [phone] [place] [address] [phone]  Electronically signed by: Leita CHRISTELLA Lunger, PA 06/01/2019 8:44 AM

## 2019-06-11 ENCOUNTER — Ambulatory Visit (INDEPENDENT_AMBULATORY_CARE_PROVIDER_SITE_OTHER): Payer: Medicare Other | Admitting: Psychology

## 2019-06-11 DIAGNOSIS — F411 Generalized anxiety disorder: Secondary | ICD-10-CM | POA: Diagnosis not present

## 2019-06-11 DIAGNOSIS — F331 Major depressive disorder, recurrent, moderate: Secondary | ICD-10-CM

## 2019-06-18 ENCOUNTER — Ambulatory Visit (INDEPENDENT_AMBULATORY_CARE_PROVIDER_SITE_OTHER): Payer: Medicare Other | Admitting: Psychology

## 2019-06-18 DIAGNOSIS — F411 Generalized anxiety disorder: Secondary | ICD-10-CM

## 2019-06-18 DIAGNOSIS — F331 Major depressive disorder, recurrent, moderate: Secondary | ICD-10-CM

## 2019-06-25 ENCOUNTER — Ambulatory Visit (INDEPENDENT_AMBULATORY_CARE_PROVIDER_SITE_OTHER): Payer: Medicare Other | Admitting: Psychology

## 2019-06-25 DIAGNOSIS — F331 Major depressive disorder, recurrent, moderate: Secondary | ICD-10-CM | POA: Diagnosis not present

## 2019-06-25 DIAGNOSIS — F411 Generalized anxiety disorder: Secondary | ICD-10-CM

## 2019-07-25 ENCOUNTER — Ambulatory Visit (INDEPENDENT_AMBULATORY_CARE_PROVIDER_SITE_OTHER): Payer: Medicare Other | Admitting: Psychology

## 2019-07-25 DIAGNOSIS — F411 Generalized anxiety disorder: Secondary | ICD-10-CM

## 2019-07-25 DIAGNOSIS — F331 Major depressive disorder, recurrent, moderate: Secondary | ICD-10-CM | POA: Diagnosis not present

## 2019-07-30 ENCOUNTER — Ambulatory Visit (INDEPENDENT_AMBULATORY_CARE_PROVIDER_SITE_OTHER): Payer: Medicare Other | Admitting: Psychology

## 2019-07-30 DIAGNOSIS — F331 Major depressive disorder, recurrent, moderate: Secondary | ICD-10-CM

## 2019-07-30 DIAGNOSIS — F411 Generalized anxiety disorder: Secondary | ICD-10-CM

## 2019-08-06 ENCOUNTER — Ambulatory Visit (INDEPENDENT_AMBULATORY_CARE_PROVIDER_SITE_OTHER): Payer: Medicare Other | Admitting: Psychology

## 2019-08-06 DIAGNOSIS — F411 Generalized anxiety disorder: Secondary | ICD-10-CM | POA: Diagnosis not present

## 2019-08-06 DIAGNOSIS — F331 Major depressive disorder, recurrent, moderate: Secondary | ICD-10-CM | POA: Diagnosis not present

## 2019-08-13 ENCOUNTER — Ambulatory Visit (INDEPENDENT_AMBULATORY_CARE_PROVIDER_SITE_OTHER): Payer: Medicare Other | Admitting: Psychology

## 2019-08-13 DIAGNOSIS — F411 Generalized anxiety disorder: Secondary | ICD-10-CM

## 2019-08-13 DIAGNOSIS — F331 Major depressive disorder, recurrent, moderate: Secondary | ICD-10-CM | POA: Diagnosis not present

## 2019-08-22 ENCOUNTER — Ambulatory Visit (INDEPENDENT_AMBULATORY_CARE_PROVIDER_SITE_OTHER): Payer: Medicare Other | Admitting: Psychology

## 2019-08-22 DIAGNOSIS — F331 Major depressive disorder, recurrent, moderate: Secondary | ICD-10-CM | POA: Diagnosis not present

## 2019-08-22 DIAGNOSIS — F411 Generalized anxiety disorder: Secondary | ICD-10-CM

## 2019-09-30 ENCOUNTER — Ambulatory Visit (INDEPENDENT_AMBULATORY_CARE_PROVIDER_SITE_OTHER): Payer: Medicare Other | Admitting: Psychology

## 2019-09-30 DIAGNOSIS — F411 Generalized anxiety disorder: Secondary | ICD-10-CM

## 2019-09-30 DIAGNOSIS — F331 Major depressive disorder, recurrent, moderate: Secondary | ICD-10-CM

## 2019-10-01 ENCOUNTER — Ambulatory Visit: Payer: Medicare Other | Admitting: Psychology

## 2019-10-08 ENCOUNTER — Ambulatory Visit (INDEPENDENT_AMBULATORY_CARE_PROVIDER_SITE_OTHER): Payer: Medicare Other | Admitting: Psychology

## 2019-10-08 DIAGNOSIS — F411 Generalized anxiety disorder: Secondary | ICD-10-CM | POA: Diagnosis not present

## 2019-10-08 DIAGNOSIS — F331 Major depressive disorder, recurrent, moderate: Secondary | ICD-10-CM | POA: Diagnosis not present

## 2019-10-14 ENCOUNTER — Ambulatory Visit (INDEPENDENT_AMBULATORY_CARE_PROVIDER_SITE_OTHER): Payer: Medicare Other | Admitting: Psychology

## 2019-10-14 DIAGNOSIS — F419 Anxiety disorder, unspecified: Secondary | ICD-10-CM | POA: Diagnosis not present

## 2019-10-14 DIAGNOSIS — F331 Major depressive disorder, recurrent, moderate: Secondary | ICD-10-CM | POA: Diagnosis not present

## 2019-10-22 ENCOUNTER — Ambulatory Visit: Payer: Medicare Other | Admitting: Endocrinology

## 2019-10-24 ENCOUNTER — Ambulatory Visit: Payer: Medicare Other | Admitting: Endocrinology

## 2019-11-18 ENCOUNTER — Ambulatory Visit (INDEPENDENT_AMBULATORY_CARE_PROVIDER_SITE_OTHER): Payer: Medicare Other | Admitting: Psychology

## 2019-11-18 DIAGNOSIS — F411 Generalized anxiety disorder: Secondary | ICD-10-CM | POA: Diagnosis not present

## 2019-11-18 DIAGNOSIS — F331 Major depressive disorder, recurrent, moderate: Secondary | ICD-10-CM | POA: Diagnosis not present

## 2019-11-21 ENCOUNTER — Ambulatory Visit: Payer: Medicare Other | Admitting: Psychology

## 2019-11-26 ENCOUNTER — Ambulatory Visit: Payer: Medicare Other | Admitting: Endocrinology

## 2019-11-27 ENCOUNTER — Ambulatory Visit: Payer: Medicare Other | Admitting: Psychology

## 2019-12-04 ENCOUNTER — Encounter: Payer: Self-pay | Admitting: Endocrinology

## 2019-12-04 ENCOUNTER — Other Ambulatory Visit: Payer: Self-pay

## 2019-12-04 ENCOUNTER — Ambulatory Visit (INDEPENDENT_AMBULATORY_CARE_PROVIDER_SITE_OTHER): Payer: Medicare Other | Admitting: Endocrinology

## 2019-12-04 VITALS — BP 110/70 | HR 59 | Ht 63.0 in | Wt 112.0 lb

## 2019-12-04 DIAGNOSIS — M818 Other osteoporosis without current pathological fracture: Secondary | ICD-10-CM | POA: Diagnosis not present

## 2019-12-04 LAB — TSH: TSH: 1.37 u[IU]/mL (ref 0.35–4.50)

## 2019-12-04 LAB — HEPATIC FUNCTION PANEL
ALT: 16 U/L (ref 0–35)
AST: 19 U/L (ref 0–37)
Albumin: 4.4 g/dL (ref 3.5–5.2)
Alkaline Phosphatase: 53 U/L (ref 39–117)
Bilirubin, Direct: 0.1 mg/dL (ref 0.0–0.3)
Total Bilirubin: 0.5 mg/dL (ref 0.2–1.2)
Total Protein: 6.9 g/dL (ref 6.0–8.3)

## 2019-12-04 LAB — VITAMIN D 25 HYDROXY (VIT D DEFICIENCY, FRACTURES): VITD: 40.76 ng/mL (ref 30.00–100.00)

## 2019-12-04 LAB — T4, FREE: Free T4: 0.8 ng/dL (ref 0.60–1.60)

## 2019-12-04 LAB — CORTISOL: Cortisol, Plasma: 12.1 ug/dL

## 2019-12-04 NOTE — Progress Notes (Signed)
Subjective:    Patient ID: [name], female    DOB: [birth year], 64 y.o.   MRN: [id number]  HPI Pt is referred by Dr Philis Pique, for osteoporosis.  Pt was noted to have osteoporosis in 2017.  She took Fosamax x 1 year.  She unncertain why she stopped it. she has never had bony fracture.  She has no history of any of the following: early menopause, multiple myeloma, renal dz, steroids, alcoholism, smoking, liver dz, or hyperparathyroidism.  She does not take heparin or anticonvulsants.  Main symptom is varying heat and cold intolerance.  She has been on ergocalciferol, 50,000 units/week, x 1 month.   Past Medical History:  Diagnosis Date  . Acute pain of right knee 03/07/2017  . Anemia   . Anorexia   . Anxiety   . Bulimia   . Depression   . Retinitis pigmentosa     Past Surgical History:  Procedure Laterality Date  . CESAREAN SECTION      Social History   Socioeconomic History  . Marital status: Divorced    Spouse name: Not on file  . Number of children: 1  . Years of education: Not on file  . Highest education level: Not on file  Occupational History  . Occupation: Statistician: Armed forces technical officer  . Occupation: Designer, fashion/clothing  . Occupation: Contractor  . 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 Smoking status: Never Smoker  . Smokeless tobacco: Never Used  Vaping Use  . Vaping Use: Never used  Substance and Sexual Activity  . Alcohol use: No  . Drug use: No  . Sexual activity: Not on file  Other Topics Concern  . Not on file  Social History Narrative   ** Merged History Encounter **       Social Determinants of Health   Financial Resource Strain:   . Difficulty of Paying Living Expenses: Not on file  Food Insecurity:   . Worried About Charity fundraiser in the Last Year: Not on file  . Ran Out of Food in the Last Year: Not on file  Transportation Needs:   . Lack of  Transportation (Medical): Not on file  . Lack of Transportation (Non-Medical): Not on file  Physical Activity:   . Days of Exercise per Week: Not on file  . Minutes of Exercise per Session: Not on file  Stress:   . Feeling of Stress : Not on file  Social Connections:   . Frequency of Communication with Friends and Family: Not on file  . Frequency of Social Gatherings with Friends and Family: Not on file  . Attends Religious Services: Not on file  . Active Member of Clubs or Organizations: Not on file  . Attends Archivist Meetings: Not on file  . Marital Status: Not on file  Intimate Partner Violence:   . Fear of Current or Ex-Partner: Not on file  . Emotionally Abused: Not on file  . Physically Abused: Not on file  . Sexually Abused: Not on file    Current Outpatient Medications on File Prior to Visit  Medication Sig Dispense Refill  . aluminum-magnesium hydroxide 200-200 MG/5ML suspension Take 10 mLs by mouth every 6 (six) hours as needed for indigestion.    Marland Kitchen escitalopram (LEXAPRO) 20 MG tablet Take 30 mg by mouth daily.    Marland Kitchen MAGNESIUM PO Take 1 tablet by mouth daily as needed (muscle  fatigue).    Marland Kitchen ONDANSETRON PO Take 1 tablet by mouth as needed (nausea).    . pantoprazole (PROTONIX) 40 MG tablet Take 1 tablet (40 mg total) by mouth daily for 14 days. 14 tablet 0  . sucralfate (CARAFATE) 1 GM/10ML suspension Take 10 mLs (1 g total) by mouth 4 (four) times daily -  with meals and at bedtime. 420 mL 0  . diazepam (VALIUM) 10 MG tablet Take 10 mg by mouth 3 (three) times daily as needed for anxiety.      No current facility-administered medications on file prior to visit.    Allergies  Allergen Reactions  . Carbamazepine Palpitations  . Prochlorperazine Edisylate     Muscle tremors Other reaction(s): Other  . Cephalexin Rash and Other (See Comments)    Other: thrush in mouth  . Amoxicillin-Pot Clavulanate Diarrhea  . Epinephrine Palpitations    Family History    Problem Relation Age of Onset  . Cancer Father   . Heart disease Father        CHF  . Diabetes Sister        Obesity  . Polymyalgia rheumatica Mother   . Osteoporosis Mother     BP 110/70   Pulse (!) 59   Ht '5\' 3"'  (1.6 m)   Wt 112 lb (50.8 kg)   LMP 02/18/2004   SpO2 98%   BMI 19.84 kg/m     Review of Systems denies weight loss, falls, muscle cramps, memory loss, and back pain.  She has heartburn.     Objective:   Physical Exam VITAL SIGNS:  See vs page GENERAL: no distress NECK: There is no palpable thyroid enlargement.  No thyroid nodule is palpable.  No palpable lymphadenopathy at the anterior neck.   Spine: no kyphosis GAIT: normal and steady  DEXA (2021): worst T-score is -3.0 (LFN)  Lab Results  Component Value Date   CREATININE 0.75 05/24/2019   BUN 21 05/24/2019   NA 140 05/24/2019   K 3.5 05/24/2019   CL 99 05/24/2019   CO2 31 05/24/2019     I have reviewed outside records, and summarized: Pt was noted to have elevated osteoporosis, and referred here.  Anxiety and chronic pain were also addressed.       Assessment & Plan:  Osteoporosis, new to me.  Uncontrolled.   Patient Instructions  Blood tests are requested for you today.  We'll let you know about the results.   You should take calcium, 1200 mg per day.   I am requesting a once a year infusion, for the osteoporosis.  you will receive a phone call, about a day and time for an appointment.   Please come back for a follow-up appointment in 1 year.

## 2019-12-04 NOTE — Patient Instructions (Addendum)
Blood tests are requested for you today.  We'll let you know about the results.   You should take calcium, 1200 mg per day.   I am requesting a once a year infusion, for the osteoporosis.  you will receive a phone call, about a day and time for an appointment.   Please come back for a follow-up appointment in 1 year.

## 2019-12-05 LAB — BASIC METABOLIC PANEL
BUN: 23 mg/dL (ref 6–23)
CO2: 34 mEq/L — ABNORMAL HIGH (ref 19–32)
Calcium: 9.5 mg/dL (ref 8.4–10.5)
Chloride: 99 mEq/L (ref 96–112)
Creatinine, Ser: 0.76 mg/dL (ref 0.40–1.20)
GFR: 87 mL/min (ref >60.00)
Glucose, Bld: 83 mg/dL (ref 70–99)
Potassium: 3.6 mEq/L (ref 3.5–5.1)
Sodium: 138 mEq/L (ref 135–145)

## 2019-12-08 LAB — PROTEIN ELECTROPHORESIS, SERUM
Albumin ELP: 4.1 g/dL (ref 3.8–4.8)
Alpha 1: 0.2 g/dL (ref 0.2–0.3)
Alpha 2: 0.6 g/dL (ref 0.5–0.9)
Beta 2: 0.3 g/dL (ref 0.2–0.5)
Beta Globulin: 0.4 g/dL (ref 0.4–0.6)
Gamma Globulin: 0.9 g/dL (ref 0.8–1.7)
Total Protein: 6.4 g/dL (ref 6.1–8.1)

## 2019-12-08 LAB — PTH, INTACT AND CALCIUM
Calcium: 9.5 mg/dL (ref 8.6–10.4)
PTH: 43 pg/mL (ref 14–64)

## 2019-12-09 ENCOUNTER — Ambulatory Visit: Payer: Medicare Other | Admitting: Psychology

## 2019-12-17 ENCOUNTER — Telehealth: Payer: Self-pay

## 2019-12-17 NOTE — Telephone Encounter (Signed)
[name] returned call from this office regarding Reclast. Patient stated we can call her in the morning

## 2019-12-17 NOTE — Telephone Encounter (Signed)
Left message for patient to call back to schedule reclast appointment.

## 2019-12-18 NOTE — Telephone Encounter (Signed)
Reclast scheduled.

## 2019-12-31 ENCOUNTER — Ambulatory Visit (INDEPENDENT_AMBULATORY_CARE_PROVIDER_SITE_OTHER): Payer: Medicare Other | Admitting: Nutrition

## 2019-12-31 ENCOUNTER — Other Ambulatory Visit: Payer: Self-pay

## 2019-12-31 DIAGNOSIS — M810 Age-related osteoporosis without current pathological fracture: Secondary | ICD-10-CM

## 2019-12-31 NOTE — Progress Notes (Signed)
After reviewing Dr. Cordelia Pen note 12/04/19, and after the patient signed the consent and IV was started in her right arm.  Normal Saline was flushed at 9:07AM, and the IV was intact.  At 9:10AM 5 mg of reclast was started and infused until 9:45AM.  Normal saline was again flushed and the needle was removed.  The site showed no signes of redness or swelling.  She was encouraged to drink 4-6 glasses of water today and she agreed to do this.  She had no final quesitons

## 2020-01-01 NOTE — Patient Instructions (Signed)
Please drink 4-6 glasses of water today.

## 2020-01-02 ENCOUNTER — Ambulatory Visit (INDEPENDENT_AMBULATORY_CARE_PROVIDER_SITE_OTHER): Payer: Medicare Other | Admitting: Psychology

## 2020-01-02 DIAGNOSIS — F331 Major depressive disorder, recurrent, moderate: Secondary | ICD-10-CM | POA: Diagnosis not present

## 2020-01-02 DIAGNOSIS — F411 Generalized anxiety disorder: Secondary | ICD-10-CM

## 2020-01-13 ENCOUNTER — Ambulatory Visit: Payer: Medicare Other | Admitting: Psychology

## 2020-01-14 ENCOUNTER — Ambulatory Visit (INDEPENDENT_AMBULATORY_CARE_PROVIDER_SITE_OTHER): Payer: Medicare Other | Admitting: Psychology

## 2020-01-14 DIAGNOSIS — F411 Generalized anxiety disorder: Secondary | ICD-10-CM

## 2020-01-14 DIAGNOSIS — F331 Major depressive disorder, recurrent, moderate: Secondary | ICD-10-CM

## 2020-01-16 ENCOUNTER — Ambulatory Visit: Payer: Medicare Other | Admitting: Psychology

## 2020-01-30 ENCOUNTER — Ambulatory Visit (INDEPENDENT_AMBULATORY_CARE_PROVIDER_SITE_OTHER): Payer: Medicare Other | Admitting: Psychology

## 2020-01-30 DIAGNOSIS — F411 Generalized anxiety disorder: Secondary | ICD-10-CM

## 2020-01-30 DIAGNOSIS — F331 Major depressive disorder, recurrent, moderate: Secondary | ICD-10-CM | POA: Diagnosis not present

## 2020-02-05 ENCOUNTER — Ambulatory Visit (INDEPENDENT_AMBULATORY_CARE_PROVIDER_SITE_OTHER): Payer: Medicare Other | Admitting: Psychology

## 2020-02-05 DIAGNOSIS — F411 Generalized anxiety disorder: Secondary | ICD-10-CM

## 2020-02-05 DIAGNOSIS — F331 Major depressive disorder, recurrent, moderate: Secondary | ICD-10-CM

## 2020-02-06 ENCOUNTER — Ambulatory Visit: Payer: Medicare Other | Admitting: Psychology

## 2020-02-12 ENCOUNTER — Ambulatory Visit (INDEPENDENT_AMBULATORY_CARE_PROVIDER_SITE_OTHER): Payer: Medicare Other | Admitting: Psychology

## 2020-02-12 DIAGNOSIS — F411 Generalized anxiety disorder: Secondary | ICD-10-CM

## 2020-02-12 DIAGNOSIS — F331 Major depressive disorder, recurrent, moderate: Secondary | ICD-10-CM

## 2020-02-13 ENCOUNTER — Ambulatory Visit: Payer: Medicare Other | Admitting: Psychology

## 2020-02-20 ENCOUNTER — Ambulatory Visit (INDEPENDENT_AMBULATORY_CARE_PROVIDER_SITE_OTHER): Payer: Medicare Other | Admitting: Psychology

## 2020-02-20 DIAGNOSIS — F331 Major depressive disorder, recurrent, moderate: Secondary | ICD-10-CM | POA: Diagnosis not present

## 2020-02-20 DIAGNOSIS — F441 Dissociative fugue: Secondary | ICD-10-CM

## 2020-02-27 ENCOUNTER — Ambulatory Visit (INDEPENDENT_AMBULATORY_CARE_PROVIDER_SITE_OTHER): Payer: Medicare Other | Admitting: Psychology

## 2020-02-27 DIAGNOSIS — F411 Generalized anxiety disorder: Secondary | ICD-10-CM | POA: Diagnosis not present

## 2020-02-27 DIAGNOSIS — F331 Major depressive disorder, recurrent, moderate: Secondary | ICD-10-CM

## 2020-03-05 ENCOUNTER — Ambulatory Visit (INDEPENDENT_AMBULATORY_CARE_PROVIDER_SITE_OTHER): Payer: Medicare Other | Admitting: Psychology

## 2020-03-05 DIAGNOSIS — F331 Major depressive disorder, recurrent, moderate: Secondary | ICD-10-CM

## 2020-03-05 DIAGNOSIS — F411 Generalized anxiety disorder: Secondary | ICD-10-CM

## 2020-03-12 ENCOUNTER — Ambulatory Visit (INDEPENDENT_AMBULATORY_CARE_PROVIDER_SITE_OTHER): Payer: Medicare Other | Admitting: Psychology

## 2020-03-12 DIAGNOSIS — F411 Generalized anxiety disorder: Secondary | ICD-10-CM

## 2020-03-12 DIAGNOSIS — F331 Major depressive disorder, recurrent, moderate: Secondary | ICD-10-CM | POA: Diagnosis not present

## 2020-03-19 ENCOUNTER — Ambulatory Visit (INDEPENDENT_AMBULATORY_CARE_PROVIDER_SITE_OTHER): Payer: Medicare Other | Admitting: Psychology

## 2020-03-19 DIAGNOSIS — F331 Major depressive disorder, recurrent, moderate: Secondary | ICD-10-CM | POA: Diagnosis not present

## 2020-03-19 DIAGNOSIS — F411 Generalized anxiety disorder: Secondary | ICD-10-CM | POA: Diagnosis not present

## 2020-03-26 ENCOUNTER — Ambulatory Visit (INDEPENDENT_AMBULATORY_CARE_PROVIDER_SITE_OTHER): Payer: Medicare Other | Admitting: Psychology

## 2020-03-26 DIAGNOSIS — F411 Generalized anxiety disorder: Secondary | ICD-10-CM | POA: Diagnosis not present

## 2020-03-26 DIAGNOSIS — F331 Major depressive disorder, recurrent, moderate: Secondary | ICD-10-CM

## 2020-04-02 ENCOUNTER — Ambulatory Visit (INDEPENDENT_AMBULATORY_CARE_PROVIDER_SITE_OTHER): Payer: Medicare Other | Admitting: Psychology

## 2020-04-02 DIAGNOSIS — F331 Major depressive disorder, recurrent, moderate: Secondary | ICD-10-CM

## 2020-04-02 DIAGNOSIS — F411 Generalized anxiety disorder: Secondary | ICD-10-CM | POA: Diagnosis not present

## 2020-04-09 ENCOUNTER — Ambulatory Visit (INDEPENDENT_AMBULATORY_CARE_PROVIDER_SITE_OTHER): Payer: Medicare Other | Admitting: Psychology

## 2020-04-09 DIAGNOSIS — F331 Major depressive disorder, recurrent, moderate: Secondary | ICD-10-CM | POA: Diagnosis not present

## 2020-04-09 DIAGNOSIS — F411 Generalized anxiety disorder: Secondary | ICD-10-CM

## 2020-04-16 ENCOUNTER — Ambulatory Visit (INDEPENDENT_AMBULATORY_CARE_PROVIDER_SITE_OTHER): Payer: Medicare Other | Admitting: Psychology

## 2020-04-16 DIAGNOSIS — F411 Generalized anxiety disorder: Secondary | ICD-10-CM

## 2020-04-16 DIAGNOSIS — F331 Major depressive disorder, recurrent, moderate: Secondary | ICD-10-CM | POA: Diagnosis not present

## 2020-04-23 ENCOUNTER — Ambulatory Visit (INDEPENDENT_AMBULATORY_CARE_PROVIDER_SITE_OTHER): Payer: Medicare Other | Admitting: Psychology

## 2020-04-23 DIAGNOSIS — F331 Major depressive disorder, recurrent, moderate: Secondary | ICD-10-CM | POA: Diagnosis not present

## 2020-04-23 DIAGNOSIS — F411 Generalized anxiety disorder: Secondary | ICD-10-CM

## 2020-04-30 ENCOUNTER — Ambulatory Visit (INDEPENDENT_AMBULATORY_CARE_PROVIDER_SITE_OTHER): Payer: Medicare Other | Admitting: Psychology

## 2020-04-30 DIAGNOSIS — F331 Major depressive disorder, recurrent, moderate: Secondary | ICD-10-CM | POA: Diagnosis not present

## 2020-04-30 DIAGNOSIS — F411 Generalized anxiety disorder: Secondary | ICD-10-CM

## 2020-05-07 ENCOUNTER — Ambulatory Visit (INDEPENDENT_AMBULATORY_CARE_PROVIDER_SITE_OTHER): Payer: Medicare Other | Admitting: Psychology

## 2020-05-07 DIAGNOSIS — F331 Major depressive disorder, recurrent, moderate: Secondary | ICD-10-CM | POA: Diagnosis not present

## 2020-05-07 DIAGNOSIS — F411 Generalized anxiety disorder: Secondary | ICD-10-CM

## 2020-05-12 ENCOUNTER — Other Ambulatory Visit: Payer: Self-pay | Admitting: Physician Assistant

## 2020-05-12 DIAGNOSIS — Z1211 Encounter for screening for malignant neoplasm of colon: Secondary | ICD-10-CM

## 2020-05-13 ENCOUNTER — Other Ambulatory Visit: Payer: Self-pay | Admitting: Physician Assistant

## 2020-05-13 DIAGNOSIS — Z1211 Encounter for screening for malignant neoplasm of colon: Secondary | ICD-10-CM

## 2020-05-14 ENCOUNTER — Ambulatory Visit: Payer: Medicare Other | Admitting: Psychology

## 2020-05-21 ENCOUNTER — Ambulatory Visit (INDEPENDENT_AMBULATORY_CARE_PROVIDER_SITE_OTHER): Payer: Medicare Other | Admitting: Psychology

## 2020-05-21 DIAGNOSIS — F331 Major depressive disorder, recurrent, moderate: Secondary | ICD-10-CM | POA: Diagnosis not present

## 2020-05-21 DIAGNOSIS — F411 Generalized anxiety disorder: Secondary | ICD-10-CM

## 2020-05-28 ENCOUNTER — Ambulatory Visit (INDEPENDENT_AMBULATORY_CARE_PROVIDER_SITE_OTHER): Payer: Medicare Other | Admitting: Psychology

## 2020-05-28 DIAGNOSIS — F331 Major depressive disorder, recurrent, moderate: Secondary | ICD-10-CM | POA: Diagnosis not present

## 2020-05-28 DIAGNOSIS — F411 Generalized anxiety disorder: Secondary | ICD-10-CM

## 2020-06-01 ENCOUNTER — Inpatient Hospital Stay: Admission: RE | Admit: 2020-06-01 | Payer: Medicare Other | Source: Ambulatory Visit

## 2020-06-01 ENCOUNTER — Ambulatory Visit: Payer: Self-pay

## 2020-06-04 ENCOUNTER — Ambulatory Visit (INDEPENDENT_AMBULATORY_CARE_PROVIDER_SITE_OTHER): Payer: Medicare Other | Admitting: Psychology

## 2020-06-04 DIAGNOSIS — F331 Major depressive disorder, recurrent, moderate: Secondary | ICD-10-CM

## 2020-06-04 DIAGNOSIS — F411 Generalized anxiety disorder: Secondary | ICD-10-CM

## 2020-06-07 ENCOUNTER — Inpatient Hospital Stay: Admission: RE | Admit: 2020-06-07 | Payer: Medicare Other | Source: Ambulatory Visit

## 2020-06-10 ENCOUNTER — Ambulatory Visit (INDEPENDENT_AMBULATORY_CARE_PROVIDER_SITE_OTHER): Payer: Medicare Other | Admitting: Psychology

## 2020-06-10 DIAGNOSIS — F411 Generalized anxiety disorder: Secondary | ICD-10-CM

## 2020-06-10 DIAGNOSIS — F331 Major depressive disorder, recurrent, moderate: Secondary | ICD-10-CM

## 2020-06-11 ENCOUNTER — Ambulatory Visit: Payer: Medicare Other | Admitting: Psychology

## 2020-06-17 ENCOUNTER — Ambulatory Visit: Payer: Medicare Other

## 2020-06-18 ENCOUNTER — Ambulatory Visit (INDEPENDENT_AMBULATORY_CARE_PROVIDER_SITE_OTHER): Payer: Medicare Other | Admitting: Psychology

## 2020-06-18 DIAGNOSIS — F331 Major depressive disorder, recurrent, moderate: Secondary | ICD-10-CM

## 2020-06-18 DIAGNOSIS — F411 Generalized anxiety disorder: Secondary | ICD-10-CM | POA: Diagnosis not present

## 2020-06-25 ENCOUNTER — Ambulatory Visit: Payer: Medicare Other | Admitting: Psychology

## 2020-07-02 ENCOUNTER — Ambulatory Visit (INDEPENDENT_AMBULATORY_CARE_PROVIDER_SITE_OTHER): Payer: Medicare Other | Admitting: Psychology

## 2020-07-02 DIAGNOSIS — F411 Generalized anxiety disorder: Secondary | ICD-10-CM | POA: Diagnosis not present

## 2020-07-02 DIAGNOSIS — F331 Major depressive disorder, recurrent, moderate: Secondary | ICD-10-CM

## 2020-07-07 ENCOUNTER — Ambulatory Visit (INDEPENDENT_AMBULATORY_CARE_PROVIDER_SITE_OTHER): Payer: Medicare Other | Admitting: Psychology

## 2020-07-07 DIAGNOSIS — F411 Generalized anxiety disorder: Secondary | ICD-10-CM | POA: Diagnosis not present

## 2020-07-07 DIAGNOSIS — F331 Major depressive disorder, recurrent, moderate: Secondary | ICD-10-CM | POA: Diagnosis not present

## 2020-07-09 ENCOUNTER — Ambulatory Visit: Payer: Medicare Other | Admitting: Psychology

## 2020-07-09 ENCOUNTER — Inpatient Hospital Stay: Admission: RE | Admit: 2020-07-09 | Payer: Medicare Other | Source: Ambulatory Visit

## 2020-07-16 ENCOUNTER — Ambulatory Visit (INDEPENDENT_AMBULATORY_CARE_PROVIDER_SITE_OTHER): Payer: Medicare Other | Admitting: Psychology

## 2020-07-16 DIAGNOSIS — F331 Major depressive disorder, recurrent, moderate: Secondary | ICD-10-CM | POA: Diagnosis not present

## 2020-07-19 ENCOUNTER — Ambulatory Visit (INDEPENDENT_AMBULATORY_CARE_PROVIDER_SITE_OTHER): Payer: Medicare Other | Admitting: Psychology

## 2020-07-19 DIAGNOSIS — F331 Major depressive disorder, recurrent, moderate: Secondary | ICD-10-CM | POA: Diagnosis not present

## 2020-07-19 DIAGNOSIS — F411 Generalized anxiety disorder: Secondary | ICD-10-CM

## 2020-07-30 ENCOUNTER — Ambulatory Visit (INDEPENDENT_AMBULATORY_CARE_PROVIDER_SITE_OTHER): Payer: Medicare Other | Admitting: Psychology

## 2020-07-30 DIAGNOSIS — F331 Major depressive disorder, recurrent, moderate: Secondary | ICD-10-CM

## 2020-07-30 DIAGNOSIS — F411 Generalized anxiety disorder: Secondary | ICD-10-CM | POA: Diagnosis not present

## 2020-08-06 ENCOUNTER — Ambulatory Visit (INDEPENDENT_AMBULATORY_CARE_PROVIDER_SITE_OTHER): Payer: Medicare Other | Admitting: Psychology

## 2020-08-06 DIAGNOSIS — F411 Generalized anxiety disorder: Secondary | ICD-10-CM

## 2020-08-06 DIAGNOSIS — F331 Major depressive disorder, recurrent, moderate: Secondary | ICD-10-CM | POA: Diagnosis not present

## 2020-08-13 ENCOUNTER — Ambulatory Visit: Payer: Medicare Other | Admitting: Psychology

## 2020-08-13 ENCOUNTER — Ambulatory Visit (INDEPENDENT_AMBULATORY_CARE_PROVIDER_SITE_OTHER): Payer: Medicare Other | Admitting: Psychology

## 2020-08-13 DIAGNOSIS — F411 Generalized anxiety disorder: Secondary | ICD-10-CM

## 2020-08-13 DIAGNOSIS — F331 Major depressive disorder, recurrent, moderate: Secondary | ICD-10-CM

## 2020-08-20 ENCOUNTER — Ambulatory Visit: Payer: Medicare Other | Admitting: Psychology

## 2020-08-27 ENCOUNTER — Ambulatory Visit (INDEPENDENT_AMBULATORY_CARE_PROVIDER_SITE_OTHER): Payer: Medicare Other | Admitting: Psychology

## 2020-08-27 DIAGNOSIS — F331 Major depressive disorder, recurrent, moderate: Secondary | ICD-10-CM | POA: Diagnosis not present

## 2020-08-27 DIAGNOSIS — F411 Generalized anxiety disorder: Secondary | ICD-10-CM | POA: Diagnosis not present

## 2020-09-03 ENCOUNTER — Ambulatory Visit (INDEPENDENT_AMBULATORY_CARE_PROVIDER_SITE_OTHER): Payer: Medicare Other | Admitting: Psychology

## 2020-09-03 DIAGNOSIS — F411 Generalized anxiety disorder: Secondary | ICD-10-CM | POA: Diagnosis not present

## 2020-09-03 DIAGNOSIS — F331 Major depressive disorder, recurrent, moderate: Secondary | ICD-10-CM

## 2020-09-10 ENCOUNTER — Ambulatory Visit (INDEPENDENT_AMBULATORY_CARE_PROVIDER_SITE_OTHER): Payer: Medicare Other | Admitting: Psychology

## 2020-09-10 DIAGNOSIS — F411 Generalized anxiety disorder: Secondary | ICD-10-CM

## 2020-09-10 DIAGNOSIS — F331 Major depressive disorder, recurrent, moderate: Secondary | ICD-10-CM | POA: Diagnosis not present

## 2020-09-17 ENCOUNTER — Ambulatory Visit (INDEPENDENT_AMBULATORY_CARE_PROVIDER_SITE_OTHER): Payer: Medicare Other | Admitting: Psychology

## 2020-09-17 DIAGNOSIS — F411 Generalized anxiety disorder: Secondary | ICD-10-CM

## 2020-09-17 DIAGNOSIS — F331 Major depressive disorder, recurrent, moderate: Secondary | ICD-10-CM

## 2020-09-24 ENCOUNTER — Ambulatory Visit (INDEPENDENT_AMBULATORY_CARE_PROVIDER_SITE_OTHER): Payer: Medicare Other | Admitting: Psychology

## 2020-09-24 DIAGNOSIS — F411 Generalized anxiety disorder: Secondary | ICD-10-CM

## 2020-09-24 DIAGNOSIS — F331 Major depressive disorder, recurrent, moderate: Secondary | ICD-10-CM | POA: Diagnosis not present

## 2020-10-01 ENCOUNTER — Ambulatory Visit (INDEPENDENT_AMBULATORY_CARE_PROVIDER_SITE_OTHER): Payer: Medicare Other | Admitting: Psychology

## 2020-10-01 DIAGNOSIS — F411 Generalized anxiety disorder: Secondary | ICD-10-CM | POA: Diagnosis not present

## 2020-10-01 DIAGNOSIS — F331 Major depressive disorder, recurrent, moderate: Secondary | ICD-10-CM | POA: Diagnosis not present

## 2020-10-08 ENCOUNTER — Ambulatory Visit (INDEPENDENT_AMBULATORY_CARE_PROVIDER_SITE_OTHER): Payer: Medicare Other | Admitting: Psychology

## 2020-10-08 DIAGNOSIS — F331 Major depressive disorder, recurrent, moderate: Secondary | ICD-10-CM | POA: Diagnosis not present

## 2020-10-08 DIAGNOSIS — F411 Generalized anxiety disorder: Secondary | ICD-10-CM

## 2020-10-13 ENCOUNTER — Ambulatory Visit (INDEPENDENT_AMBULATORY_CARE_PROVIDER_SITE_OTHER): Payer: Medicare Other | Admitting: Psychology

## 2020-10-13 DIAGNOSIS — F331 Major depressive disorder, recurrent, moderate: Secondary | ICD-10-CM

## 2020-10-13 DIAGNOSIS — F411 Generalized anxiety disorder: Secondary | ICD-10-CM | POA: Diagnosis not present

## 2020-10-15 ENCOUNTER — Ambulatory Visit: Payer: Medicare Other | Admitting: Psychology

## 2020-10-21 ENCOUNTER — Ambulatory Visit (INDEPENDENT_AMBULATORY_CARE_PROVIDER_SITE_OTHER): Payer: Medicare Other | Admitting: Psychology

## 2020-10-21 DIAGNOSIS — F331 Major depressive disorder, recurrent, moderate: Secondary | ICD-10-CM | POA: Diagnosis not present

## 2020-10-21 DIAGNOSIS — F411 Generalized anxiety disorder: Secondary | ICD-10-CM | POA: Diagnosis not present

## 2020-10-22 ENCOUNTER — Ambulatory Visit: Payer: Medicare Other | Admitting: Psychology

## 2020-10-29 ENCOUNTER — Ambulatory Visit: Payer: Medicare Other | Admitting: Psychology

## 2020-11-05 ENCOUNTER — Ambulatory Visit (INDEPENDENT_AMBULATORY_CARE_PROVIDER_SITE_OTHER): Payer: Medicare Other | Admitting: Psychology

## 2020-11-05 DIAGNOSIS — F331 Major depressive disorder, recurrent, moderate: Secondary | ICD-10-CM

## 2020-11-05 DIAGNOSIS — F411 Generalized anxiety disorder: Secondary | ICD-10-CM

## 2020-11-12 ENCOUNTER — Ambulatory Visit (INDEPENDENT_AMBULATORY_CARE_PROVIDER_SITE_OTHER): Payer: Medicare Other | Admitting: Psychology

## 2020-11-12 DIAGNOSIS — F411 Generalized anxiety disorder: Secondary | ICD-10-CM | POA: Diagnosis not present

## 2020-11-12 DIAGNOSIS — F331 Major depressive disorder, recurrent, moderate: Secondary | ICD-10-CM

## 2020-11-19 ENCOUNTER — Ambulatory Visit (INDEPENDENT_AMBULATORY_CARE_PROVIDER_SITE_OTHER): Payer: Medicare Other | Admitting: Gastroenterology

## 2020-11-19 ENCOUNTER — Ambulatory Visit (INDEPENDENT_AMBULATORY_CARE_PROVIDER_SITE_OTHER): Payer: Medicare Other | Admitting: Psychology

## 2020-11-19 ENCOUNTER — Encounter: Payer: Self-pay | Admitting: Gastroenterology

## 2020-11-19 VITALS — BP 90/60 | HR 72 | Ht 62.25 in

## 2020-11-19 DIAGNOSIS — F502 Bulimia nervosa: Secondary | ICD-10-CM

## 2020-11-19 DIAGNOSIS — R110 Nausea: Secondary | ICD-10-CM

## 2020-11-19 DIAGNOSIS — K5904 Chronic idiopathic constipation: Secondary | ICD-10-CM

## 2020-11-19 DIAGNOSIS — F411 Generalized anxiety disorder: Secondary | ICD-10-CM

## 2020-11-19 DIAGNOSIS — F331 Major depressive disorder, recurrent, moderate: Secondary | ICD-10-CM

## 2020-11-19 DIAGNOSIS — K219 Gastro-esophageal reflux disease without esophagitis: Secondary | ICD-10-CM

## 2020-11-19 MED ORDER — FAMOTIDINE 20 MG PO TABS: 20.0000 mg | ORAL_TABLET | Freq: Every day | ORAL | 5 refills | Status: DC | PRN

## 2020-11-19 MED ORDER — SUCRALFATE 1 G PO TABS: 1.0000 g | ORAL_TABLET | Freq: Three times a day (TID) | ORAL | 5 refills | Status: DC | PRN

## 2020-11-19 NOTE — Patient Instructions (Signed)
We have sent the following medications to your pharmacy for you to pick up at your convenience: Carafate and pepcid.   Stop ex-lax.  Take over the counter Miralax 1 capful daily, titrate as needed.   Follow up with Dr. Silverio Decamp as needed.

## 2020-11-19 NOTE — Progress Notes (Signed)
[name]    [id number]    22-Oct-1955  Primary Care Physician:Jeffery, Domingo Mend, [place]  Referring Physician: Harrison Mons, [address],  [place] [id number]   Chief complaint: Constipation, eating disorder, nausea  HPI: 65 year old very pleasant female with history of anemia nervosa, generalized anxiety disorder with complaints of abdominal pain, nausea and constipation She has been struggling with eating disorder for many years now, she has significant fluctuation in her weight.  Her mother passed away recently and she is struggling to cope with it, has been eating to the extent she has severe abdominal pain followed by significant nausea and vomiting. She has also been taking approximately 50 Ex-Lax tablets on average every other day to have a bowel movement.  Patient states that she does not go if she does not take Ex-Lax.  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  No dysphagia or odynophagia.  CT abdomen and pelvis with contrast 05/24/19: small punctate L renal calculi otherwise normal exam  Upper GI series 03/07/18: Mild esophageal dysmotility otherwise negative exam  Cologaurd 2020: Negative    Outpatient Encounter Medications as of 11/19/2020  Medication Sig   clonazePAM (KLONOPIN) 1 MG tablet Take 1 tablet by mouth daily.   escitalopram (LEXAPRO) 20 MG tablet Take 30 mg by mouth daily.   MAGNESIUM PO Take 1 tablet by mouth daily as needed (muscle fatigue).   OLANZapine (ZYPREXA) 2.5 MG tablet Take 1 tablet by mouth every 14 (fourteen) days.   [DISCONTINUED] aluminum-magnesium hydroxide 200-200 MG/5ML suspension Take 10 mLs by mouth every 6 (six) hours as needed for indigestion.    [DISCONTINUED] diazepam (VALIUM) 10 MG tablet Take 10 mg by mouth 3 (three) times daily as needed for anxiety.    [DISCONTINUED] ONDANSETRON PO Take 1 tablet by mouth as needed (nausea).   [DISCONTINUED] pantoprazole (PROTONIX) 40 MG tablet Take 1 tablet (40 mg total) by mouth daily for 14 days.   [DISCONTINUED] sucralfate (CARAFATE) 1 GM/10ML suspension Take 10 mLs (1 g total) by mouth 4 (four) times daily -  with meals and at bedtime.   No facility-administered encounter medications on file as of 11/19/2020.    Allergies as of 11/19/2020 - Review Complete 11/19/2020  Allergen Reaction Noted   Carbamazepine Palpitations 03/13/2014   Prochlorperazine edisylate  08/18/2011   Keflex [cephalexin] Rash and Other (See Comments) 07/16/2014   Augmentin [amoxicillin-pot clavulanate] Diarrhea    Compazine [prochlorperazine]  11/19/2020   Epinephrine Palpitations 08/18/2011    Past Medical History:  Diagnosis Date   Acute pain of right knee 03/07/2017   Anemia    Anorexia    Anxiety    Bulimia    Depression    Retinitis pigmentosa     Past Surgical History:  Procedure Laterality Date   CESAREAN SECTION      Family History  Problem Relation Age of Onset   Cancer Father    Heart disease Father        CHF   Diabetes Sister        Obesity   Polymyalgia rheumatica Mother    Osteoporosis Mother     Social History   Socioeconomic History   Marital status: Divorced  Spouse name: Not on file   Number of children: 1   Years of education: Not on file   Highest education level: Not on file  Occupational History   Occupation: Hotel manager education    Employer: [employer]   Occupation: Designer, fashion/clothing   Occupation: Contractor   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  Tobacco Use   Smoking status: Never   Smokeless tobacco: Never  Vaping Use   Vaping Use: Never used  Substance and Sexual Activity   Alcohol use: No   Drug use:  No   Sexual activity: Not on file  Other Topics Concern   Not on file  Social History Narrative   ** Merged History Encounter **       Social Determinants of Health   Financial Resource Strain: Not on file  Food Insecurity: Not on file  Transportation Needs: Not on file  Physical Activity: Not on file  Stress: Not on file  Social Connections: Not on file  Intimate Partner Violence: Not on file      Review of systems: All other review of systems negative except as mentioned in the HPI.   Physical Exam: Vitals:   11/19/20 0853  BP: 90/60  Pulse: 72   Body mass index is 20.32 kg/m. Gen:      No acute distress HEENT:  sclera anicteric Abd:      soft, non-tender; no palpable masses, no distension Ext:    No edema Neuro: alert and oriented x 3 Psych: normal mood and affect  Data Reviewed:  Reviewed labs, radiology imaging, old records and pertinent past GI work up   Assessment and Plan/Recommendations:  65 year old very pleasant female with history of bulimia nervosa, generalized anxiety disorder with complaints of nausea, vomiting and chronic constipation Her symptoms are mostly related to eating disorder Small frequent meals Use Carafate 1 g 3 times daily as needed before meals for GERD symptoms and epigastric discomfort Use Pepcid 20 mg daily as needed Antireflux measures Reviewed recent upper GI series, no significant pathology  Nausea: Multifactorial, secondary to eating disorder, constipation and side effect of medications Advised patient to avoid using excessive amount of Zofran due to potential side effects  Chronic idiopathic constipation: Advised patient to stop using excessive amount of Ex-Lax, she has been taking as much as 50 tablets every other day Start MiraLAX 1 capful daily, titrate up as needed to have 1-2 soft bowel movements daily Increase dietary fiber and water intake  Colorectal cancer screening: Average risk, negative Cologuard in 2020.   Due for recall in 2023  Return as needed   The patient was provided an opportunity to ask questions and all were answered. The patient agreed with the plan and demonstrated an understanding of the instructions.  Damaris Hippo , MD    CC: Harrison Mons, [place]

## 2020-11-26 ENCOUNTER — Ambulatory Visit (INDEPENDENT_AMBULATORY_CARE_PROVIDER_SITE_OTHER): Payer: Medicare Other | Admitting: Psychology

## 2020-11-26 DIAGNOSIS — F411 Generalized anxiety disorder: Secondary | ICD-10-CM | POA: Diagnosis not present

## 2020-11-26 DIAGNOSIS — F331 Major depressive disorder, recurrent, moderate: Secondary | ICD-10-CM | POA: Diagnosis not present

## 2020-12-03 ENCOUNTER — Ambulatory Visit (INDEPENDENT_AMBULATORY_CARE_PROVIDER_SITE_OTHER): Payer: Medicare Other | Admitting: Psychology

## 2020-12-03 DIAGNOSIS — F331 Major depressive disorder, recurrent, moderate: Secondary | ICD-10-CM | POA: Diagnosis not present

## 2020-12-10 ENCOUNTER — Ambulatory Visit: Payer: Medicare Other | Admitting: Psychology

## 2020-12-17 ENCOUNTER — Ambulatory Visit (INDEPENDENT_AMBULATORY_CARE_PROVIDER_SITE_OTHER): Payer: Medicare Other | Admitting: Psychology

## 2020-12-17 DIAGNOSIS — F411 Generalized anxiety disorder: Secondary | ICD-10-CM | POA: Diagnosis not present

## 2020-12-17 DIAGNOSIS — F331 Major depressive disorder, recurrent, moderate: Secondary | ICD-10-CM | POA: Diagnosis not present

## 2020-12-24 ENCOUNTER — Ambulatory Visit (INDEPENDENT_AMBULATORY_CARE_PROVIDER_SITE_OTHER): Payer: Medicare Other | Admitting: Psychology

## 2020-12-24 DIAGNOSIS — F419 Anxiety disorder, unspecified: Secondary | ICD-10-CM

## 2020-12-24 DIAGNOSIS — F331 Major depressive disorder, recurrent, moderate: Secondary | ICD-10-CM | POA: Diagnosis not present

## 2020-12-31 ENCOUNTER — Ambulatory Visit (INDEPENDENT_AMBULATORY_CARE_PROVIDER_SITE_OTHER): Payer: Medicare Other | Admitting: Psychology

## 2020-12-31 DIAGNOSIS — F331 Major depressive disorder, recurrent, moderate: Secondary | ICD-10-CM | POA: Diagnosis not present

## 2021-01-07 ENCOUNTER — Ambulatory Visit: Payer: Medicare Other | Admitting: Psychology

## 2021-01-14 ENCOUNTER — Ambulatory Visit (INDEPENDENT_AMBULATORY_CARE_PROVIDER_SITE_OTHER): Payer: Medicare Other | Admitting: Psychology

## 2021-01-14 DIAGNOSIS — F411 Generalized anxiety disorder: Secondary | ICD-10-CM | POA: Diagnosis not present

## 2021-01-14 DIAGNOSIS — F331 Major depressive disorder, recurrent, moderate: Secondary | ICD-10-CM

## 2021-01-21 ENCOUNTER — Ambulatory Visit (INDEPENDENT_AMBULATORY_CARE_PROVIDER_SITE_OTHER): Payer: Medicare Other | Admitting: Psychology

## 2021-01-21 DIAGNOSIS — F331 Major depressive disorder, recurrent, moderate: Secondary | ICD-10-CM | POA: Diagnosis not present

## 2021-01-21 DIAGNOSIS — F411 Generalized anxiety disorder: Secondary | ICD-10-CM | POA: Diagnosis not present

## 2021-01-21 DIAGNOSIS — F4321 Adjustment disorder with depressed mood: Secondary | ICD-10-CM

## 2021-01-21 DIAGNOSIS — F509 Eating disorder, unspecified: Secondary | ICD-10-CM

## 2021-01-21 NOTE — Progress Notes (Signed)
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 (Status: maintained) -- No Description Entered  Excessive and/or unrealistic worry that is difficult to control occurring more days than not for at least 6 months about a number of events or activities. (Status: maintained) -- No Description Entered  Intense fear of gaining weight or becoming fat, even though underweight. (Status: maintained) -- No Description Entered  Lack of energy. (Status: maintained) -- No Description Entered  Persistent preoccupation with body image related to grossly inaccurate assessment of self as overweight. (Status: maintained) -- No Description Entered  Recurrent inappropriate compensatory behaviors in order to prevent weight gain, such as self-induced vomiting; misuse of laxatives, diuretics, enemas, or other medications; fasting; or excessive exercise. (Status: maintained) -- No Description Entered  Thoughts dominated by loss coupled with poor concentration, tearful spells, and confusion about the future. (Status: maintained) -- No Description Entered  Unresolved grief issues. (Status: maintained) -- No Description Entered  Medication Status compliance  Safety none  If Suicidal or Homicidal State Action Taken: unspecified  Current Risk: low Medications Klonapin (Dosage: 1mg )  Lexapro (Dosage: 30mg )  xyprexa (Dosage: 2.5mg /day)  Objectives Related Problem: Stabilize anxiety level while increasing ability to function on a daily basis. Description: Learn and implement problem-solving strategies for realistically addressing worries. Target Date: 2021-03-25 Frequency: Daily Modality: individual Progress: 40%  Related Problem: Stabilize anxiety level while  increasing ability to function on a daily basis. Description: Learn and implement calming skills to reduce overall anxiety and manage anxiety symptoms. Target Date: 2021-03-25 Frequency: Daily Modality: individual Progress: 40%  Related Problem: Stabilize anxiety level while increasing ability to function on a daily basis. Description: Describe situations, thoughts, feelings, and actions associated with anxieties and worries, their impact on functioning, and attempts to resolve them. Target Date: 2021-03-25 Frequency: Daily Modality: individual Progress: 50%  Client Response full compliance  Service Location Location, [address] [place], [place] [address]  Service Code cpt [id number]  Normalize/Reframe  Facilitate problem solving  Identified an insight  Identify automatic thoughts  Integration of affect  Distress tolerance skill  Self care activities  Journaling  Self-monitoring  Comments  Dx.: Depression, Anxiety and Eating Disorder Meds: Neurontin, Klonapin, Lexapro, Zophran (nausea), Magnesium, Lybalbi, Praosin.  Goals: Patient reports extreme levels of debilitating anxiety. She is seeking to reduce symptoms that interfere with day to day functioning. She wants to resolve her chronic fears of death and being abandoned. Goal date is 09-2020. Patient is having numerous medical symptoms and has episodes of fear that she is going to die. Revised goal date is 2-23. In addition, she is experiencing complicated grief related to the death of her mother. She reports feeling "disconnected" from her surroundings. Goal mostly achieved 07-2020. Will continue to address this issue with additional sessions. Goal 2-23.  Patient agrees to a video (Webex) session due to the pandemic. She is at home and I am in my home office.  [name] shared a message left to her by her daughter. It was a very hostile rambling message about how much she hates her mother. This has been a life long pattern. [name]  decided to block her so she does not  have to hear these messages. She has done this in the past but then reconnects with her after a period of time. [employer]  is concerned about her daughter's mental health and chronic threats to kill herself.  [name] says she has been taking laxatives again and she made herself throw up. Told her we need to get this under control. She says that her Primary Care doctor has a specialization in Eating Disorders. Agrees to set up appointment with her doctor today to address her eating issues. She has a distorted image of herself as fat and is obsessed with wanting to lose weight. She will let me know when she has an appointment.    [name], PhD

## 2021-01-28 ENCOUNTER — Ambulatory Visit (INDEPENDENT_AMBULATORY_CARE_PROVIDER_SITE_OTHER): Payer: Medicare Other | Admitting: Psychology

## 2021-01-28 DIAGNOSIS — F411 Generalized anxiety disorder: Secondary | ICD-10-CM | POA: Diagnosis not present

## 2021-01-28 NOTE — Progress Notes (Signed)
Date: 01/28/2021  Treatment Plan Diagnosis F43.21 (Adjustment Disorder, With depressed mood) [n/a]  F41.1 (Generalized anxiety disorder) [n/a]  F50.9 (Unspecified feeding or eating disorder) [n/a]  M60.04 (Uncomplicated bereavement) [n/a]  Symptoms Diminished interest in or enjoyment of activities. (Status: maintained) -- No Description Entered  Excessive and/or unrealistic worry that is difficult to control occurring more days than not for at least 6 months about a number of events or activities. (Status: maintained) -- No Description Entered  Intense fear of gaining weight or becoming fat, even though underweight. (Status: maintained) -- No Description Entered  Lack of energy. (Status: maintained) -- No Description Entered  Persistent preoccupation with body image related to grossly inaccurate assessment of self as overweight. (Status: maintained) -- No Description Entered  Recurrent inappropriate compensatory behaviors in order to prevent weight gain, such as self-induced vomiting; misuse of laxatives, diuretics, enemas, or other medications; fasting; or excessive exercise. (Status: maintained) -- No Description Entered  Thoughts dominated by loss coupled with poor concentration, tearful spells, and confusion about the future. (Status: maintained) -- No Description Entered  Unresolved grief issues. (Status: maintained) -- No Description Entered  Medication Status compliance  Safety none  If Suicidal or Homicidal State Action Taken: unspecified  Current Risk: low Medications Klonapin (Dosage: 26m)  Lexapro (Dosage: 364m  xyprexa (Dosage: 2.73m22may)  Objectives Related Problem: Stabilize anxiety level while increasing ability to function on a daily basis. Description: Learn and implement problem-solving strategies for realistically addressing worries. Target Date: 2021-03-25 Frequency: Daily Modality: individual Progress: 40%  Related Problem: Stabilize anxiety level while  increasing ability to function on a daily basis. Description: Learn and implement calming skills to reduce overall anxiety and manage anxiety symptoms. Target Date: 2021-03-25 Frequency: Daily Modality: individual Progress: 40%  Related Problem: Stabilize anxiety level while increasing ability to function on a daily basis. Description: Describe situations, thoughts, feelings, and actions associated with anxieties and worries, their impact on functioning, and attempts to resolve them. Target Date: 2021-03-25 Frequency: Daily Modality: individual Progress: 50%  Client Response full compliance  Service Location Location, [address] [place] [id number] Code cpt [id number]/Reframe  Facilitate problem solving  Identified an insight  Identify automatic thoughts  Integration of affect  Distress tolerance skill  Self care activities  Journaling  Self-monitoring  Comments  Dx.: Depression, Anxiety and Eating Disorder Meds: Neurontin, Klonapin, Lexapro, Zophran (nausea), Magnesium, Lybalbi, Praosin.  Goals: Patient reports extreme levels of debilitating anxiety. She is seeking to reduce symptoms that interfere with day to day functioning. She wants to resolve her chronic fears of death and being abandoned. Goal date is 09-2020. Patient is having numerous medical symptoms and has episodes of fear that she is going to die. Revised goal date is 2-23. In addition, she is experiencing complicated grief related to the death of her mother. She reports feeling "disconnected" from her surroundings. Goal mostly achieved 07-2020. Will continue to address this issue with additional sessions. Goal 2-23.  Patient agrees to a video (Webex) session due to the pandemic. She is at home and I am in my home office.  She states that today is her birthday and she plans to drive to [place] Sunday. She will be there until Wednesday. She will celebrate there and there is a holiday  party. She met with her PCP, who is going to ask the psychiatrist to prescribe Vivance. She told [name] that Vivance helps binge eating. BetGwinda Passes not yet heard from her psychiatrist.  She was bothered by the recent suicide of a public figure because he "hid it (depression) from everyone and then shot himself. She also hides her depression and says "I do not want to get to a place where I don't want to live anymore". She denies any current suicidal thoughts/feelings. She does say that her energy is depleted and that her doctor shared that it is likely due to her anxiety. She reports that she has not been abusing laxatives this week, but has purged a few times. She does say she wants to get on a good eating plan, I inquired about seeing a nutritionist, as we discussed in the past. She forgot to bring up with her PCP, but will send her a message for a referral.  She says that she and her psychiatrist talked about going to a day treatment program. She will talk it over with her PCP, who she will be seeing weekly to monitor her eating disorder.         [name], PhD Time: 7:40-8:30 50 minutes

## 2021-02-04 ENCOUNTER — Ambulatory Visit (INDEPENDENT_AMBULATORY_CARE_PROVIDER_SITE_OTHER): Payer: Medicare Other | Admitting: Psychology

## 2021-02-04 DIAGNOSIS — F411 Generalized anxiety disorder: Secondary | ICD-10-CM | POA: Diagnosis not present

## 2021-02-04 NOTE — Progress Notes (Signed)
Date: 02/04/2021  Treatment Plan Diagnosis F43.21 (Adjustment Disorder, With depressed mood) [n/a]  F41.1 (Generalized anxiety disorder) [n/a]  F50.9 (Unspecified feeding or eating disorder) [n/a]  Z61.09 (Uncomplicated bereavement) [n/a]  Symptoms Diminished interest in or enjoyment of activities. (Status: maintained) -- No Description Entered  Excessive and/or unrealistic worry that is difficult to control occurring more days than not for at least 6 months about a number of events or activities. (Status: maintained) -- No Description Entered  Intense fear of gaining weight or becoming fat, even though underweight. (Status: maintained) -- No Description Entered  Lack of energy. (Status: maintained) -- No Description Entered  Persistent preoccupation with body image related to grossly inaccurate assessment of self as overweight. (Status: maintained) -- No Description Entered  Recurrent inappropriate compensatory behaviors in order to prevent weight gain, such as self-induced vomiting; misuse of laxatives, diuretics, enemas, or other medications; fasting; or excessive exercise. (Status: maintained) -- No Description Entered  Thoughts dominated by loss coupled with poor concentration, tearful spells, and confusion about the future. (Status: maintained) -- No Description Entered  Unresolved grief issues. (Status: maintained) -- No Description Entered  Medication Status compliance  Safety none  If Suicidal or Homicidal State Action Taken: unspecified  Current Risk: low Medications Klonapin (Dosage: 1mg )  Lexapro (Dosage: 30mg )  xyprexa (Dosage: 2.5mg /day)  Objectives Related Problem: Stabilize anxiety level while increasing ability to function on a daily basis. Description: Learn and implement problem-solving strategies for realistically addressing worries. Target Date: 2021-03-25 Frequency: Daily Modality: individual Progress: 40%  Related Problem: Stabilize anxiety level while  increasing ability to function on a daily basis. Description: Learn and implement calming skills to reduce overall anxiety and manage anxiety symptoms. Target Date: 2021-03-25 Frequency: Daily Modality: individual Progress: 40%  Related Problem: Stabilize anxiety level while increasing ability to function on a daily basis. Description: Describe situations, thoughts, feelings, and actions associated with anxieties and worries, their impact on functioning, and attempts to resolve them. Target Date: 2021-03-25 Frequency: Daily Modality: individual Progress: 50%  Client Response full compliance  Service Location Location, [address] [place], [place] [address]  Service Code cpt [phone]  Normalize/Reframe  Facilitate problem solving  Identified an insight  Identify automatic thoughts  Integration of affect  Distress tolerance skill  Self care activities  Journaling  Self-monitoring  Comments  Dx.: Depression, Anxiety and Eating Disorder Meds: Neurontin, Klonapin, Lexapro, Zophran (nausea), Magnesium, Lybalbi, Praosin.  Goals: Patient reports extreme levels of debilitating anxiety. She is seeking to reduce symptoms that interfere with day to day functioning. She wants to resolve her chronic fears of death and being abandoned. Goal date is 09-2020. Patient is having numerous medical symptoms and has episodes of fear that she is going to die. Revised goal date is 2-23. In addition, she is experiencing complicated grief related to the death of her mother. She reports feeling "disconnected" from her surroundings. Goal mostly achieved 07-2020. Will continue to address this issue with additional sessions. Goal 2-23.  Patient agrees to a video (Webex) session due to the pandemic. She is at home and I am in my home office.  [name] was surprised that her employer/friend in [place] did not do anything to celebrate [name]'s birthday. [name] was able to drive both ways, keeping her anxiety in  check. Her binge eating, however, was terrible and she reports having a "food hangover". She had a significant purging episode following her binging. She is talking with he doctors about her episodes and they are consulting about  her medication. They are also monitoring her eating behaviors on a regular cadence.  While in [place], she wrote her ex-boyfriend a long letter about what their relationship was like in the past. She did not hear back from him and is not sure if it was received. She acknowledges that it is the relationship she misses more than the person. She misses having a significant other and anticipates she will never have another relationship.  [name] talked about her best friend who also suffers an eating disorder. Her friend is also active in her eating disorder and [name] reports feeling "jealous" of her thin stature. [name] also discussed that her friend has made no effort to resolve her eating issues. We talked about the health risks of her behavior. While aware of these risks, it does not impact her behavior. Encouraged her to keep the medical issues in the forefront of her mind and to employ strategies we discussed to avoid the binge-purge cycle.         [name], PhD Time: 7:40-8:30 50 minutes

## 2021-02-11 ENCOUNTER — Ambulatory Visit: Payer: Medicare Other | Admitting: Psychology

## 2021-02-11 ENCOUNTER — Ambulatory Visit (INDEPENDENT_AMBULATORY_CARE_PROVIDER_SITE_OTHER): Payer: Medicare Other | Admitting: Psychology

## 2021-02-11 DIAGNOSIS — F411 Generalized anxiety disorder: Secondary | ICD-10-CM | POA: Diagnosis not present

## 2021-02-11 DIAGNOSIS — F509 Eating disorder, unspecified: Secondary | ICD-10-CM

## 2021-02-11 NOTE — Progress Notes (Signed)
Date: 02/11/2021  Treatment Plan Diagnosis F43.21 (Adjustment Disorder, With depressed mood) [n/a]  F41.1 (Generalized anxiety disorder) [n/a]  F50.9 (Unspecified feeding or eating disorder) [n/a]  F68.12 (Uncomplicated bereavement) [n/a]  Symptoms Diminished interest in or enjoyment of activities. (Status: maintained) -- No Description Entered  Excessive and/or unrealistic worry that is difficult to control occurring more days than not for at least 6 months about a number of events or activities. (Status: maintained) -- No Description Entered  Intense fear of gaining weight or becoming fat, even though underweight. (Status: maintained) -- No Description Entered  Lack of energy. (Status: maintained) -- No Description Entered  Persistent preoccupation with body image related to grossly inaccurate assessment of self as overweight. (Status: maintained) -- No Description Entered  Recurrent inappropriate compensatory behaviors in order to prevent weight gain, such as self-induced vomiting; misuse of laxatives, diuretics, enemas, or other medications; fasting; or excessive exercise. (Status: maintained) -- No Description Entered  Thoughts dominated by loss coupled with poor concentration, tearful spells, and confusion about the future. (Status: maintained) -- No Description Entered  Unresolved grief issues. (Status: maintained) -- No Description Entered  Medication Status compliance  Safety none  If Suicidal or Homicidal State Action Taken: unspecified  Current Risk: low Medications Klonapin (Dosage: 1mg )  Lexapro (Dosage: 30mg )  xyprexa (Dosage: 2.5mg /day)  Objectives Related Problem: Stabilize anxiety level while increasing ability to function on a daily basis. Description: Learn and implement problem-solving strategies for realistically addressing worries. Target Date: 2021-03-25 Frequency: Daily Modality: individual Progress: 40%  Related Problem: Stabilize anxiety level while  increasing ability to function on a daily basis. Description: Learn and implement calming skills to reduce overall anxiety and manage anxiety symptoms. Target Date: 2021-03-25 Frequency: Daily Modality: individual Progress: 40%  Related Problem: Stabilize anxiety level while increasing ability to function on a daily basis. Description: Describe situations, thoughts, feelings, and actions associated with anxieties and worries, their impact on functioning, and attempts to resolve them. Target Date: 2021-03-25 Frequency: Daily Modality: individual Progress: 50%  Client Response full compliance  Service Location Location, [address] [place], [place] [address]  Service Code cpt [phone]  Normalize/Reframe  Facilitate problem solving  Identified an insight  Identify automatic thoughts  Integration of affect  Distress tolerance skill  Self care activities  Journaling  Self-monitoring  Comments  Dx.: Depression, Anxiety and Eating Disorder Meds: Neurontin, Klonapin, Lexapro, Zophran (nausea), Magnesium, Lybalbi, Praosin.  Goals: Patient reports extreme levels of debilitating anxiety. She is seeking to reduce symptoms that interfere with day to day functioning. She wants to resolve her chronic fears of death and being abandoned. Goal date is 09-2020. Patient is having numerous medical symptoms and has episodes of fear that she is going to die. Revised goal date is 2-23. In addition, she is experiencing complicated grief related to the death of her mother. She reports feeling "disconnected" from her surroundings. Goal mostly achieved 07-2020. Will continue to address this issue with additional sessions. Goal 2-23.  Patient agrees to a video (Webex) session due to the pandemic. She is at home and I am in my home office.  [name] talked about her friend's husband who just died of Covid 87 this past week. She said that her apartment no longer feels like a safe place. She is most anxious when  she is in her apartment and she is chronically nervous and questioning her existence (dissociating). It will subside as the morning progresses.  She will see her primary care doctor again today  for her eating issues. The doctor is trying to get her Vivance, but needs pre-authorization. It is hoped this will help her binge eating behaviors. They check her blood and vitals at each visit, and discuss her eating for the week. She says she has not binged in 3-4 days. Feels that if she can "eat normal" for two weeks then she will be in a healthy rhythm. We talked about the need for her to have a very structured diet. She will discuss with her doctor today.         [name], PhD Time: 7:40-8:30 50 minutes

## 2021-02-18 ENCOUNTER — Ambulatory Visit: Payer: Medicare Other | Admitting: Psychology

## 2021-02-23 ENCOUNTER — Ambulatory Visit (INDEPENDENT_AMBULATORY_CARE_PROVIDER_SITE_OTHER): Payer: Medicare Other | Admitting: Psychology

## 2021-02-23 DIAGNOSIS — F411 Generalized anxiety disorder: Secondary | ICD-10-CM | POA: Diagnosis not present

## 2021-02-23 NOTE — Progress Notes (Signed)
[name] is a 66 y.o. female patient   Date: 02/23/2021  Treatment Plan Diagnosis F43.21 (Adjustment Disorder, With depressed mood) [n/a]  F41.1 (Generalized anxiety disorder) [n/a]  F50.9 (Unspecified feeding or eating disorder) [n/a]  X52.84 (Uncomplicated bereavement) [n/a]  Symptoms Diminished interest in or enjoyment of activities. (Status: maintained) -- No Description Entered  Excessive and/or unrealistic worry that is difficult to control occurring more days than not for at least 6 months about a number of events or activities. (Status: maintained) -- No Description Entered  Intense fear of gaining weight or becoming fat, even though underweight. (Status: maintained) -- No Description Entered  Lack of energy. (Status: maintained) -- No Description Entered  Persistent preoccupation with body image related to grossly inaccurate assessment of self as overweight. (Status: maintained) -- No Description Entered  Recurrent inappropriate compensatory behaviors in order to prevent weight gain, such as self-induced vomiting; misuse of laxatives, diuretics, enemas, or other medications; fasting; or excessive exercise. (Status: maintained) -- No Description Entered  Thoughts dominated by loss coupled with poor concentration, tearful spells, and confusion about the future. (Status: maintained) -- No Description Entered  Unresolved grief issues. (Status: maintained) -- No Description Entered  Medication Status compliance  Safety none  If Suicidal or Homicidal State Action Taken: unspecified  Current Risk: low Medications Klonapin (Dosage: 1mg )  Lexapro (Dosage: 30mg )  xyprexa (Dosage: 2.5mg /day)  Objectives Related Problem: Stabilize anxiety level while increasing ability to function on a daily basis. Description: Learn and implement problem-solving strategies for realistically addressing worries. Target Date: 2021-03-25 Frequency: Daily Modality: individual Progress: 50%  Related  Problem: Stabilize anxiety level while increasing ability to function on a daily basis. Description: Learn and implement calming skills to reduce overall anxiety and manage anxiety symptoms. Target Date: 2021-03-25 Frequency: Daily Modality: individual Progress: 40%  Related Problem: Stabilize anxiety level while increasing ability to function on a daily basis. Description: Describe situations, thoughts, feelings, and actions associated with anxieties and worries, their impact on functioning, and attempts to resolve them. Target Date: 2021-03-25 Frequency: Daily Modality: individual Progress: 50%  Client Response full compliance  Service Location Location, [address] [place], [place] [address]  Service Code cpt [phone]  Normalize/Reframe  Facilitate problem solving  Identified an insight  Identify automatic thoughts  Integration of affect  Distress tolerance skill  Self care activities  Journaling  Self-monitoring  Comments  Dx.: Depression, Anxiety and Eating Disorder Meds: Neurontin, Klonapin, Lexapro, Zophran (nausea), Magnesium, Lybalbi, Praosin.  Goals: Patient reports extreme levels of debilitating anxiety. She is seeking to reduce symptoms that interfere with day to day functioning. She wants to resolve her chronic fears of death and being abandoned. Goal date is 09-2020. Patient is having numerous medical symptoms and has episodes of fear that she is going to die. Revised goal date is 2-23. In addition, she is experiencing complicated grief related to the death of her mother. She reports feeling "disconnected" from her surroundings. Goal mostly achieved 07-2020. Will continue to address this issue with additional sessions. Goal 2-23.  Patient agrees to a video (Webex) session due to the pandemic. She is at home and I am in my home office.  [name] talked about the increase in Covid 19 cases and that she is having to take extra precautions at work. She says that she went  to get her Vivance prescription and it was $200.00. She was told that her insurance will "kick in" after she meets her deductible. Her PCP referred her to a  nutritionist but there are no appointments until May. She will explore other options. The medicine that was to help with nightmares did not have any effect, so she stopped it after 3 nights. She feels the nightmares may have been worse. She reports anxiety continues, but it still improves as the day moves along. She will be trying the Vivance once she clears up the issue related to the costs.  Last e-mail exchange from her daughter is that she was having a "mental breakdown" and wanted [name] to call. [name] called her, but got voice mail. Her daughter did not return her call.  [name] sent an e-mail to a "former friend" [name]) and suggested they get coffee together some time. He was friendlier at the gym (said "goodbye) but did not acknowledge her invitation to coffee. I asked her about her agenda and she is unable to articulate. She does say she is not attracted to him, but misses the relationship. Will not follow-up with him at this time. Appears much more calm today, but this is likely the change in time of appointment.              [name], PhD Time: 11:35a-12:30p 60 minutes                [name], PhD

## 2021-02-25 ENCOUNTER — Ambulatory Visit: Payer: Medicare Other | Admitting: Psychology

## 2021-03-04 ENCOUNTER — Ambulatory Visit: Payer: Self-pay | Admitting: Psychology

## 2021-03-04 NOTE — Progress Notes (Incomplete)
° ° ° ° ° ° ° ° ° ° ° ° ° ° °  Janice Cordrey LEWIS, PhD 

## 2021-03-11 ENCOUNTER — Ambulatory Visit (INDEPENDENT_AMBULATORY_CARE_PROVIDER_SITE_OTHER): Payer: Medicare Other | Admitting: Psychology

## 2021-03-11 DIAGNOSIS — F411 Generalized anxiety disorder: Secondary | ICD-10-CM

## 2021-03-11 NOTE — Progress Notes (Signed)
[name] is a 66 y.o. female patient   Date: 03/11/2021  Treatment Plan Diagnosis F43.21 (Adjustment Disorder, With depressed mood) [n/a]  F41.1 (Generalized anxiety disorder) [n/a]  F50.9 (Unspecified feeding or eating disorder) [n/a]  I43.32 (Uncomplicated bereavement) [n/a]  Symptoms Diminished interest in or enjoyment of activities. (Status: maintained) -- No Description Entered  Excessive and/or unrealistic worry that is difficult to control occurring more days than not for at least 6 months about a number of events or activities. (Status: maintained) -- No Description Entered  Intense fear of gaining weight or becoming fat, even though underweight. (Status: maintained) -- No Description Entered  Lack of energy. (Status: maintained) -- No Description Entered  Persistent preoccupation with body image related to grossly inaccurate assessment of self as overweight. (Status: maintained) -- No Description Entered  Recurrent inappropriate compensatory behaviors in order to prevent weight gain, such as self-induced vomiting; misuse of laxatives, diuretics, enemas, or other medications; fasting; or excessive exercise. (Status: maintained) -- No Description Entered  Thoughts dominated by loss coupled with poor concentration, tearful spells, and confusion about the future. (Status: maintained) -- No Description Entered  Unresolved grief issues. (Status: maintained) -- No Description Entered  Medication Status compliance  Safety none  If Suicidal or Homicidal State Action Taken: unspecified  Current Risk: low Medications Klonapin (Dosage: 1mg )  Lexapro (Dosage: 30mg )  xyprexa (Dosage: 2.5mg /day)  Objectives Related Problem: Stabilize anxiety level while increasing ability to function on a daily basis. Description: Learn and implement problem-solving strategies for realistically addressing worries. Target Date: 2021-03-25 Frequency: Daily Modality: individual Progress: 50%   Related Problem: Stabilize anxiety level while increasing ability to function on a daily basis. Description: Learn and implement calming skills to reduce overall anxiety and manage anxiety symptoms. Target Date: 2021-03-25 Frequency: Daily Modality: individual Progress: 40%  Related Problem: Stabilize anxiety level while increasing ability to function on a daily basis. Description: Describe situations, thoughts, feelings, and actions associated with anxieties and worries, their impact on functioning, and attempts to resolve them. Target Date: 2021-03-25 Frequency: Daily Modality: individual Progress: 50%  Client Response full compliance  Service Location Location, [address] [place], [place] [address]  Service Code cpt [id number]  Normalize/Reframe  Facilitate problem solving  Identified an insight  Identify automatic thoughts  Integration of affect  Distress tolerance skill  Self care activities  Journaling  Self-monitoring  Comments  Dx.: Depression, Anxiety and Eating Disorder Meds: Neurontin, Klonapin, Lexapro, Zophran (nausea), Magnesium, Lybalbi, Praosin.  Goals: Patient reports extreme levels of debilitating anxiety. She is seeking to reduce symptoms that interfere with day to day functioning. She wants to resolve her chronic fears of death and being abandoned. Goal date is 09-2020. Patient is having numerous medical symptoms and has episodes of fear that she is going to die. Revised goal date is 2-23. In addition, she is experiencing complicated grief related to the death of her mother. She reports feeling "disconnected" from her surroundings. Goal mostly achieved 07-2020. Will continue to address this issue with additional sessions. Goal 8-23.  Patient agrees to a video (Webex) session due to the pandemic. She is at home and I am in my home office.  [name] says her psychiatrist is taking her off of Lexapro and putting her on Paxil. The transition will take place over 1  month period of time. The thinking is her body is not as responsive to the Lexapro and that Paxil will help with the dreams. She talked about how  difficult it has been to feel disconnected. She is fatigued with her anxiety. She has not been binging and purging because she has been nauseated. The Vivance is supposed to help curtail her binge purge cycle. She states that she joined a gym and that when there, she feels more connected and far less anxious. She is going 2 times a week.                 Janice Morel, PhD Time: 8:29F-6:21H 55 minutes

## 2021-03-15 ENCOUNTER — Ambulatory Visit (INDEPENDENT_AMBULATORY_CARE_PROVIDER_SITE_OTHER): Payer: Medicare Other

## 2021-03-15 ENCOUNTER — Encounter: Payer: Self-pay | Admitting: Orthopaedic Surgery

## 2021-03-15 ENCOUNTER — Ambulatory Visit (INDEPENDENT_AMBULATORY_CARE_PROVIDER_SITE_OTHER): Payer: Medicare Other | Admitting: Psychology

## 2021-03-15 ENCOUNTER — Other Ambulatory Visit: Payer: Self-pay

## 2021-03-15 ENCOUNTER — Ambulatory Visit (INDEPENDENT_AMBULATORY_CARE_PROVIDER_SITE_OTHER): Payer: Medicare Other | Admitting: Orthopaedic Surgery

## 2021-03-15 DIAGNOSIS — G8929 Other chronic pain: Secondary | ICD-10-CM

## 2021-03-15 DIAGNOSIS — M25561 Pain in right knee: Secondary | ICD-10-CM

## 2021-03-15 DIAGNOSIS — F411 Generalized anxiety disorder: Secondary | ICD-10-CM

## 2021-03-15 NOTE — Progress Notes (Signed)
Office Visit Note   Patient: [name]           Date of Birth: 02-27-1955           MRN: [id number] Visit Date: 03/15/2021              Requested by: Harrison Mons, [place],  [place] [id number] PCP: Harrison Mons, PA   Assessment & Plan: Visit Diagnoses:  1. Chronic pain of right knee     Plan: [name] comes in today for right knee pain.  We saw her 2 years ago and did a cortisone injection which helped.  She feels like she has some medial sided knee pain without any mechanical symptoms.  Overall she feels like the pain is manageable.  Denies any injuries.  Examination of the right knee shows no joint effusion.  Normal range of motion.  Slight tenderness on the medial side of the knee. No overt joint line tenderness.  Negative McMurray.  Collaterals and cruciates are stable.  Overall [name] feels like the pain is quite manageable and wants to just continue to do home exercises.  She declined injection today.  She will follow-up with [place] as needed.  Follow-Up Instructions: No follow-ups on file.   Orders:  Orders Placed This Encounter  Procedures   XR KNEE 3 VIEW RIGHT   No orders of the defined types were placed in this encounter.     Procedures: No procedures performed   Clinical Data: No additional findings.   Subjective: Chief Complaint  Patient presents with   Right Knee - Pain    HPI  Review of Systems   Objective: Vital Signs: LMP 02/18/2004   Physical Exam  Ortho Exam  Specialty Comments:  No specialty comments available.  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   Lower abdominal pain 12/11/2011   Copper deficiency 09/19/2011   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   Osteoporosis 09/19/2011   Numbness and tingling 09/19/2011   Weakness generalized 09/19/2011   Retinitis pigmentosa 09/19/2011   Age-related osteoporosis without current pathological fracture 09/19/2011   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   Past Medical History:  Diagnosis Date   Acute pain of right knee 03/07/2017   Anemia    Anorexia    Anxiety    Bulimia    Depression    Retinitis pigmentosa     Family History  Problem Relation Age of Onset   Cancer Father    Heart disease Father        CHF   Diabetes Sister        Obesity   Polymyalgia rheumatica Mother    Osteoporosis Mother     Past Surgical History:  Procedure Laterality Date   CESAREAN SECTION     Social History   Occupational History   Occupation: Hotel manager education    Employer: [employer]   Occupation: Designer, fashion/clothing   Occupation: Contractor   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  Tobacco Use   Smoking status: Never   Smokeless tobacco:  Never  Vaping Use   Vaping Use: Never used  Substance and Sexual Activity   Alcohol use: No   Drug use: No   Sexual activity: Not on file

## 2021-03-15 NOTE — Progress Notes (Signed)
[name] is a 66 y.o. female patient   Date: 03/15/2021  Treatment Plan Diagnosis F43.21 (Adjustment Disorder, With depressed mood) [n/a]  F41.1 (Generalized anxiety disorder) [n/a]  F50.9 (Unspecified feeding or eating disorder) [n/a]  L37.34 (Uncomplicated bereavement) [n/a]  Symptoms Diminished interest in or enjoyment of activities. (Status: maintained) -- No Description Entered  Excessive and/or unrealistic worry that is difficult to control occurring more days than not for at least 6 months about a number of events or activities. (Status: maintained) -- No Description Entered  Intense fear of gaining weight or becoming fat, even though underweight. (Status: maintained) -- No Description Entered  Lack of energy. (Status: maintained) -- No Description Entered  Persistent preoccupation with body image related to grossly inaccurate assessment of self as overweight. (Status: maintained) -- No Description Entered  Recurrent inappropriate compensatory behaviors in order to prevent weight gain, such as self-induced vomiting; misuse of laxatives, diuretics, enemas, or other medications; fasting; or excessive exercise. (Status: maintained) -- No Description Entered  Thoughts dominated by loss coupled with poor concentration, tearful spells, and confusion about the future. (Status: maintained) -- No Description Entered  Unresolved grief issues. (Status: maintained) -- No Description Entered  Medication Status compliance  Safety none  If Suicidal or Homicidal State Action Taken: unspecified  Current Risk: low Medications Klonapin (Dosage: 1mg )  Lexapro (Dosage: 30mg )  xyprexa (Dosage: 2.5mg /day)  Objectives Related Problem: Stabilize anxiety level while increasing ability to function on a daily basis. Description: Learn and implement problem-solving strategies for realistically addressing worries. Target Date: 2021-03-25 Frequency: Daily Modality:  individual Progress: 50%  Related Problem: Stabilize anxiety level while increasing ability to function on a daily basis. Description: Learn and implement calming skills to reduce overall anxiety and manage anxiety symptoms. Target Date: 2021-03-25 Frequency: Daily Modality: individual Progress: 40%  Related Problem: Stabilize anxiety level while increasing ability to function on a daily basis. Description: Describe situations, thoughts, feelings, and actions associated with anxieties and worries, their impact on functioning, and attempts to resolve them. Target Date: 2021-03-25 Frequency: Daily Modality: individual Progress: 50%  Client Response full compliance  Service Location Location, [address] [place], [place] [address]  Service Code cpt [phone]  Normalize/Reframe  Facilitate problem solving  Identified an insight  Identify automatic thoughts  Integration of affect  Distress tolerance skill  Self care activities  Journaling  Self-monitoring  Session Notes: Dx.: Depression, Anxiety and Eating Disorder Meds: Neurontin, Klonapin, Paxil, Zophran (nausea), Magnesium, Lybalbi, Praosin.  Goals: Patient reports extreme levels of debilitating anxiety. She is seeking to reduce symptoms that interfere with day to day functioning. She wants to resolve her chronic fears of death and being abandoned. Goal date is 09-2020. Patient is having numerous medical symptoms and has episodes of fear that she is going to die. Revised goal date is 2-23. In addition, she is experiencing complicated grief related to the death of her mother. She reports feeling "disconnected" from her surroundings. Goal mostly achieved 07-2020. Will continue to address this issue with additional sessions. Goal 8-23.  Patient agrees to a video (Webex) session due to the pandemic. She is at home and I am in my home office.  [name] resigned her position in [place]. She felt her boss/friend was overly critical and  sent a very professional letter. Got a very appropriate and kind response. [name] struggled with this decision. She is reluctant to establish a plan on rolling out the announcement  of her leaving. Told her that she needs a plan. Talked about wanting to keep a client and the strategy to make the request. She is now at 10mg  Lexapro and 10 mg of Paxil. Reports her dreams are "not as bad" but still anxious in the mornings. She had not binged or purged in "a long time", but did both last night. Likely related to her anxiety over the resignation. She did not sleep and is exhausted. She says she will be able to process my recommendations better after she gets some sleep.  She has thought about moving apartments to see if it will help her anxiety, but says that the thought makes her sad. Will continue to consider if this would be an appropriate move and achieve desired results.                  [name], PhD Time: 7:34L-9:37T 45 minutes

## 2021-03-18 ENCOUNTER — Ambulatory Visit: Payer: Medicare Other | Admitting: Psychology

## 2021-03-25 ENCOUNTER — Ambulatory Visit (INDEPENDENT_AMBULATORY_CARE_PROVIDER_SITE_OTHER): Payer: Medicare Other | Admitting: Psychology

## 2021-03-25 DIAGNOSIS — F4322 Adjustment disorder with anxiety: Secondary | ICD-10-CM | POA: Diagnosis not present

## 2021-03-25 NOTE — Progress Notes (Signed)
[name] is a 66 y.o. female patient   Date: 03/25/2021  Treatment Plan Diagnosis F43.21 (Adjustment Disorder, With depressed mood) [n/a]  F41.1 (Generalized anxiety disorder) [n/a]  F50.9 (Unspecified feeding or eating disorder) [n/a]  Z61.09 (Uncomplicated bereavement) [n/a]  Symptoms Diminished interest in or enjoyment of activities. (Status: maintained) -- No Description Entered  Excessive and/or unrealistic worry that is difficult to control occurring more days than not for at least 6 months about a number of events or activities. (Status: maintained) -- No Description Entered  Intense fear of gaining weight or becoming fat, even though underweight. (Status: maintained) -- No Description Entered  Lack of energy. (Status: maintained) -- No Description Entered  Persistent preoccupation with body image related to grossly inaccurate assessment of self as overweight. (Status: maintained) -- No Description Entered  Recurrent inappropriate compensatory behaviors in order to prevent weight gain, such as self-induced vomiting; misuse of laxatives, diuretics, enemas, or other medications; fasting; or excessive exercise. (Status: maintained) -- No Description Entered  Thoughts dominated by loss coupled with poor concentration, tearful spells, and confusion about the future. (Status: maintained) -- No Description Entered  Unresolved grief issues. (Status: maintained) -- No Description Entered  Medication Status compliance  Safety none  If Suicidal or Homicidal State Action Taken: unspecified  Current Risk: low Medications Klonapin (Dosage: 1mg )  Lexapro (Dosage: 30mg )  xyprexa (Dosage: 2.5mg /day)  Objectives Related Problem: Stabilize anxiety level while increasing ability to function on a daily basis. Description: Learn and implement problem-solving strategies for realistically addressing worries. Target Date: 2021-09-22 Frequency: Daily Modality: individual Progress: 50%  Related  Problem: Stabilize anxiety level while increasing ability to function on a daily basis. Description: Learn and implement calming skills to reduce overall anxiety and manage anxiety symptoms. Target Date: 2021-09-22 Frequency: Daily Modality: individual Progress: 40%  Related Problem: Stabilize anxiety level while increasing ability to function on a daily basis. Description: Describe situations, thoughts, feelings, and actions associated with anxieties and worries, their impact on functioning, and attempts to resolve them. Target Date: 2021-09-22 Frequency: Daily Modality: individual Progress: 50%  Client Response full compliance  Service Location Location, [address] [place],  [address]  Service Code cpt [phone]  Normalize/Reframe  Facilitate problem solving  Identified an insight  Identify automatic thoughts  Integration of affect  Distress tolerance skill  Self care activities  Journaling  Self-monitoring  Session Notes: Dx.: Depression, Anxiety and Eating Disorder Meds: Neurontin, Klonapin, Paxil, Zophran (nausea), Magnesium, Lybalbi, Praosin.  Goals: Patient reports extreme levels of debilitating anxiety. She is seeking to reduce symptoms that interfere with day to day functioning. She wants to resolve her chronic fears of death and being abandoned. Goal date is 09-2020. Patient is having numerous medical symptoms and has episodes of fear that she is going to die. Revised goal date is 2-23. In addition, she is experiencing complicated grief related to the death of her mother. She reports feeling "disconnected" from her surroundings. Goal mostly achieved 09-2020. Will continue to address this issue with additional sessions. Goal 8-23.  Patient agrees to a video (Webex) session due to the pandemic. She is at home and I am in my home office.  [name] says she has 8 more days of Lexapro and will then be on 20 mg of Paxil. She is looking for nutritionists, and has an  appointment next week. Last night she binged and then spent 45 minutes throwing up. 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 She has decided to continue her work with the [employer], but to a much lesser degree. She also received a raise and is satisfied with this arrangement.  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 When she is "on stage and performing" she subsequently binged as a release. She is letting her physician know about her behaviors. She was given Vivance to curtail binging, but has not started taking it. She has fears of taking the medicine, thinking it may have bad side effects. We talked about non-compliance. She agrees to talk with her physician about inpatient eating disorders programs. Says she would consider going if she could go for two weeks. States that "maybe I am afraid to lose weight". She wants to think about that thought and discuss at next session.                     [name], PhD Time: 7:40a-8:30p 50 minutes

## 2021-04-01 ENCOUNTER — Ambulatory Visit: Payer: Medicare Other | Admitting: Psychology

## 2021-04-07 ENCOUNTER — Ambulatory Visit: Payer: Self-pay | Admitting: Psychology

## 2021-04-07 ENCOUNTER — Ambulatory Visit (INDEPENDENT_AMBULATORY_CARE_PROVIDER_SITE_OTHER): Payer: Medicare Other | Admitting: Psychology

## 2021-04-07 DIAGNOSIS — F4322 Adjustment disorder with anxiety: Secondary | ICD-10-CM

## 2021-04-07 NOTE — Progress Notes (Signed)
[name] is a 66 y.o. female patient   Date: 04/07/2021  Treatment Plan Diagnosis F43.21 (Adjustment Disorder, With depressed mood) [n/a]  F41.1 (Generalized anxiety disorder) [n/a]  F50.9 (Unspecified feeding or eating disorder) [n/a]  X32.35 (Uncomplicated bereavement) [n/a]  Symptoms Diminished interest in or enjoyment of activities. (Status: maintained) -- No Description Entered  Excessive and/or unrealistic worry that is difficult to control occurring more days than not for at least 6 months about a number of events or activities. (Status: maintained) -- No Description Entered  Intense fear of gaining weight or becoming fat, even though underweight. (Status: maintained) -- No Description Entered  Lack of energy. (Status: maintained) -- No Description Entered  Persistent preoccupation with body image related to grossly inaccurate assessment of self as overweight. (Status: maintained) -- No Description Entered  Recurrent inappropriate compensatory behaviors in order to prevent weight gain, such as self-induced vomiting; misuse of laxatives, diuretics, enemas, or other medications; fasting; or excessive exercise. (Status: maintained) -- No Description Entered  Thoughts dominated by loss coupled with poor concentration, tearful spells, and confusion about the future. (Status: maintained) -- No Description Entered  Unresolved grief issues. (Status: maintained) -- No Description Entered  Medication Status compliance  Safety none  If Suicidal or Homicidal State Action Taken: unspecified  Current Risk: low Medications Klonapin (Dosage: 1mg )  Lexapro (Dosage: 30mg )  xyprexa (Dosage: 2.5mg /day)  Objectives Related Problem: Stabilize anxiety level while increasing ability to function on a daily basis. Description: Learn and implement problem-solving strategies for realistically addressing worries. Target Date: 2021-09-22 Frequency: Daily Modality: individual Progress: 50%   Related Problem: Stabilize anxiety level while increasing ability to function on a daily basis. Description: Learn and implement calming skills to reduce overall anxiety and manage anxiety symptoms. Target Date: 2021-09-22 Frequency: Daily Modality: individual Progress: 40%  Related Problem: Stabilize anxiety level while increasing ability to function on a daily basis. Description: Describe situations, thoughts, feelings, and actions associated with anxieties and worries, their impact on functioning, and attempts to resolve them. Target Date: 2021-09-22 Frequency: Daily Modality: individual Progress: 50%  Client Response full compliance  Service Location Location, [address] [place], [place] [address]  Service Code cpt [phone]  Normalize/Reframe  Facilitate problem solving  Identified an insight  Identify automatic thoughts  Integration of affect  Distress tolerance skill  Self care activities  Journaling  Self-monitoring  Session Notes: Dx.: Depression, Anxiety and Eating Disorder Meds: Neurontin, Klonapin, Paxil, Zophran (nausea), Magnesium, Lybalbi, Praosin.  Goals: Patient reports extreme levels of debilitating anxiety. She is seeking to reduce symptoms that interfere with day to day functioning. She wants to resolve her chronic fears of death and being abandoned. Goal date is 09-2020. Patient is having numerous medical symptoms and has episodes of fear that she is going to die. Revised goal date is 2-23. In addition, she is experiencing complicated grief related to the death of her mother. She reports feeling "disconnected" from her surroundings. Goal mostly achieved 09-2020. Will continue to address this issue with additional sessions. Goal 8-23.  Patient agrees to a video (Webex) session due to the pandemic. She is at home and I am in my home office.  [name] says her psychiatrist is suggesting a Ketamine treatment, but she does want to do it due to the potential for  hallucinations. Her sister in law has done this treatment and it is working well. She is not in a good place to consider this treatment. She continues to have GI issues  and is being evaluated by her doctors.  [name] says that her doctor is trying her on different meds and is frustrated with her, saying she is not fully compliant. [name] says she stops meds prematurely if she has a negative side-effect. We talked about the need to take meds as prescribed and not stop prematurely. She agrees and will try to work more closely with her doc on the meds. [name] is still talking about moving out of her apartment with the hope that she will reduce her symptoms of anxiety.  She has been trying to meet with a nutritionist, but none of them take Medicare. Will keep trying                        [name], PhD Time: 9:00a-9:45a 45minutes

## 2021-04-07 NOTE — Progress Notes (Deleted)
[name] is a 66 y.o. female patient   Date: 04/07/2021  Treatment Plan Diagnosis F43.21 (Adjustment Disorder, With depressed mood) [n/a]  F41.1 (Generalized anxiety disorder) [n/a]  F50.9 (Unspecified feeding or eating disorder) [n/a]  W29.93 (Uncomplicated bereavement) [n/a]  Symptoms Diminished interest in or enjoyment of activities. (Status: maintained) -- No Description Entered  Excessive and/or unrealistic worry that is difficult to control occurring more days than not for at least 6 months about a number of events or activities. (Status: maintained) -- No Description Entered  Intense fear of gaining weight or becoming fat, even though underweight. (Status: maintained) -- No Description Entered  Lack of energy. (Status: maintained) -- No Description Entered  Persistent preoccupation with body image related to grossly inaccurate assessment of self as overweight. (Status: maintained) -- No Description Entered  Recurrent inappropriate compensatory behaviors in order to prevent weight gain, such as self-induced vomiting; misuse of laxatives, diuretics, enemas, or other medications; fasting; or excessive exercise. (Status: maintained) -- No Description Entered  Thoughts dominated by loss coupled with poor concentration, tearful spells, and confusion about the future. (Status: maintained) -- No Description Entered  Unresolved grief issues. (Status: maintained) -- No Description Entered  Medication Status compliance  Safety none  If Suicidal or Homicidal State Action Taken: unspecified  Current Risk: low Medications Klonapin (Dosage: 1mg )  Lexapro (Dosage: 30mg )  xyprexa (Dosage: 2.5mg /day)  Objectives Related Problem: Stabilize anxiety level while increasing ability to function on a daily basis. Description: Learn and implement problem-solving strategies for realistically addressing worries. Target Date: 2021-09-22 Frequency: Daily Modality:  individual Progress: 50%  Related Problem: Stabilize anxiety level while increasing ability to function on a daily basis. Description: Learn and implement calming skills to reduce overall anxiety and manage anxiety symptoms. Target Date: 2021-09-22 Frequency: Daily Modality: individual Progress: 40%  Related Problem: Stabilize anxiety level while increasing ability to function on a daily basis. Description: Describe situations, thoughts, feelings, and actions associated with anxieties and worries, their impact on functioning, and attempts to resolve them. Target Date: 2021-09-22 Frequency: Daily Modality: individual Progress: 50%  Client Response full compliance  Service Location Location, [address] [place], [place] [address]  Service Code cpt [id number]  Normalize/Reframe  Facilitate problem solving  Identified an insight  Identify automatic thoughts  Integration of affect  Distress tolerance skill  Self care activities  Journaling  Self-monitoring  Session Notes: Dx.: Depression, Anxiety and Eating Disorder Meds: Neurontin, Klonapin, Paxil, Zophran (nausea), Magnesium, Lybalbi, Praosin.  Goals: Patient reports extreme levels of debilitating anxiety. She is seeking to reduce symptoms that interfere with day to day functioning. She wants to resolve her chronic fears of death and being abandoned. Goal date is 09-2020. Patient is having numerous medical symptoms and has episodes of fear that she is going to die. Revised goal date is 2-23. In addition, she is experiencing complicated grief related to the death of her mother. She reports feeling "disconnected" from her surroundings. Goal mostly achieved 09-2020. Will continue to address this issue with additional sessions. Goal 8-23.  Patient agrees to a video (Webex) session due to the pandemic. She is at home and I am in my home office.  [name] says she has 8 more days of Lexapro and will then be on 20 mg of Paxil. She is looking  for nutritionists, and has an appointment next week. Last night she binged and then spent 45 minutes throwing up.  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 She has decided to continue her work with the [employer], but to a much lesser degree. She also received a raise and is satisfied with this arrangement.  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 She is letting her physician know about her behaviors. She was given Vivance to curtail binging, but has not started taking it. She has fears of taking the medicine, thinking it may have bad side effects. We talked about non-compliance. She agrees to talk with her physician about inpatient eating disorders programs. Says she would consider going if she could go for two weeks. States that "maybe I am afraid to lose weight". She wants to think about that thought and discuss at next session.                     Janice Morel, PhD Time: 7:40a-8:30p 50 minutes

## 2021-04-07 NOTE — Progress Notes (Signed)
[facility] Counselor/Therapist Progress Note  Patient ID: [name], MRN: [id number],    Date: 04/07/2021  Time Spent: 45 min   Treatment Type: Individual Therapy  Reported Symptoms: anxiety  Mental Status Exam: Appearance:  Casual     Behavior: Appropriate  Motor: Normal  Speech/Language:  NA  Affect: Appropriate  Mood: anxious  Thought process: normal  Thought content:   WNL  Sensory/Perceptual disturbances:   WNL  Orientation: oriented to person, place, and situation  Attention: NA  Concentration: Good  Memory: WNL  Fund of knowledge:  Good  Insight:   Good  Judgment:  Fair  Impulse Control: Good   Risk Assessment: Danger to Self:  No Self-injurious Behavior: No Danger to Others: No Duty to Warn:no Physical Aggression / Violence:No  Access to Firearms a concern: No  Gang Involvement:No   Subjective:    Interventions: Cognitive Behavioral Therapy, Insight-Oriented, and Family Systems  Diagnosis:adjustment disorder with anxiety  Plan: outpatient therapy  [name], PhD 9:00a-9:45a

## 2021-04-08 ENCOUNTER — Ambulatory Visit: Payer: Medicare Other | Admitting: Psychology

## 2021-04-15 ENCOUNTER — Ambulatory Visit (INDEPENDENT_AMBULATORY_CARE_PROVIDER_SITE_OTHER): Payer: Medicare Other | Admitting: Psychology

## 2021-04-15 DIAGNOSIS — F4322 Adjustment disorder with anxiety: Secondary | ICD-10-CM | POA: Diagnosis not present

## 2021-04-15 NOTE — Progress Notes (Signed)
? ? ? ? ? ?[name] is a 66 y.o. female patient  ? ?Date: 04/15/2021  ?Treatment Plan ?Diagnosis ?F43.21 (Adjustment Disorder, With depressed mood) [n/a]  ?F41.1 (Generalized anxiety disorder) [n/a]  ?F50.9 (Unspecified feeding or eating disorder) [n/a]  ?V40.98 (Uncomplicated bereavement) [n/a]  ?Symptoms ?Diminished interest in or enjoyment of activities. (Status: maintained) -- No Description Entered  ?Excessive and/or unrealistic worry that is difficult to control occurring more days than not for at least 6 months about a number of events or activities. (Status: maintained) -- No Description Entered  ?Intense fear of gaining weight or becoming fat, even though underweight. (Status: maintained) -- No Description Entered  ?Lack of energy. (Status: maintained) -- No Description Entered  ?Persistent preoccupation with body image related to grossly inaccurate assessment of self as overweight. (Status: maintained) -- No Description Entered  ?Recurrent inappropriate compensatory behaviors in order to prevent weight gain, such as self-induced vomiting; misuse of laxatives, diuretics, enemas, or other medications; fasting; or excessive exercise. (Status: maintained) -- No Description Entered  ?Thoughts dominated by loss coupled with poor concentration, tearful spells, and confusion about the future. (Status: maintained) -- No Description Entered  ?Unresolved grief issues. (Status: maintained) -- No Description Entered  ?Medication Status ?compliance  ?Safety ?none  ?If Suicidal or Homicidal State Action Taken: unspecified  ?Current Risk: low ?Medications ?Klonapin (Dosage: 1mg )  ?Lexapro (Dosage: 30mg )  ?xyprexa (Dosage: 2.5mg /day)  ?Objectives ?Related Problem: Stabilize anxiety level while increasing ability to function on a daily basis. ?Description: Learn and implement problem-solving strategies for realistically addressing worries. ?Target Date: 2021-09-22 ?Frequency: Daily ?Modality: individual ?Progress:  50%  ?Related Problem: Stabilize anxiety level while increasing ability to function on a daily basis. ?Description: Learn and implement calming skills to reduce overall anxiety and manage anxiety symptoms. ?Target Date: 2021-09-22 ?Frequency: Daily ?Modality: individual ?Progress: 40% ? ?Related Problem: Stabilize anxiety level while increasing ability to function on a daily basis. ?Description: Describe situations, thoughts, feelings, and actions associated with anxieties and worries, their impact on functioning, and attempts to resolve them. ?Target Date: 2021-09-22 ?Frequency: Daily ?Modality: individual ?Progress: 50%  ?Client Response ?full compliance  ?Service Location ?Location, [address] [place], [place] [address]  ?Service Code ?cpt [id number]  ?Normalize/Reframe  ?Facilitate problem solving  ?Identified an insight  ?Identify automatic thoughts  ?Integration of affect  ?Distress tolerance skill  ?Self care activities  ?Journaling  ?Self-monitoring  ?Session Notes: ?Dx.: Depression, Anxiety and Eating Disorder ?Meds: Neurontin, Klonapin, Paxil, Zophran (nausea), Magnesium, Lybalbi, Praosin.  ?Goals: Patient reports extreme levels of debilitating anxiety. She is seeking to reduce symptoms that interfere with day to day functioning. She wants to resolve her chronic fears of death and being abandoned. Goal date is 09-2020. Patient is having numerous medical symptoms and has episodes of fear that she is going to die. Revised goal date is 2-23. In addition, she is experiencing complicated grief related to the death of her mother. She reports feeling "disconnected" from her surroundings. Goal mostly achieved 09-2020. Will continue to address this issue with additional sessions. Goal 8-23.  ?Patient agrees to a video (Webex) session due to the pandemic. She is at home and I am in my home office. ? ?[name] sent [name] a text and invited him to coffee. Says he has "gone downhill and moved into a retirement  facility". She had seen him at the gym and was thinking about him. She says that her feelings for him are not the same. She also says that she  has no expectations and "it is fine". She misses the friendship part of the relationship. She did not feel anxious when with him.  ?She is most focused on her obsession with her weight. She is working hard at not dwelling on being disconnected. Is having moderate success. We discussed continuing to work with nutritionist and the need for another GI consult.                      ? ? ?[name], PhD Time: 7:40a-8:30a 49minutes ? ?   ? ? ? ? ? ? ? ? ? ? ? ? ? ? ? ? ? ? ? ? ? ? ? ? ? ? ? ? ? ? ? ? ? ? ? ? ? ? ? ? ?

## 2021-04-22 ENCOUNTER — Ambulatory Visit (INDEPENDENT_AMBULATORY_CARE_PROVIDER_SITE_OTHER): Payer: Medicare Other | Admitting: Psychology

## 2021-04-22 DIAGNOSIS — F4322 Adjustment disorder with anxiety: Secondary | ICD-10-CM

## 2021-04-22 NOTE — Progress Notes (Signed)
? ? ?[name] is a 66 y.o. female patient  ? ?Date: 04/22/2021  ?Treatment Plan ?Diagnosis ?F43.21 (Adjustment Disorder, With depressed mood) [n/a]  ?F41.1 (Generalized anxiety disorder) [n/a]  ?F50.9 (Unspecified feeding or eating disorder) [n/a]  ?K93.81 (Uncomplicated bereavement) [n/a]  ?Symptoms ?Diminished interest in or enjoyment of activities. (Status: maintained) -- No Description Entered  ?Excessive and/or unrealistic worry that is difficult to control occurring more days than not for at least 6 months about a number of events or activities. (Status: maintained) -- No Description Entered  ?Intense fear of gaining weight or becoming fat, even though underweight. (Status: maintained) -- No Description Entered  ?Lack of energy. (Status: maintained) -- No Description Entered  ?Persistent preoccupation with body image related to grossly inaccurate assessment of self as overweight. (Status: maintained) -- No Description Entered  ?Recurrent inappropriate compensatory behaviors in order to prevent weight gain, such as self-induced vomiting; misuse of laxatives, diuretics, enemas, or other medications; fasting; or excessive exercise. (Status: maintained) -- No Description Entered  ?Thoughts dominated by loss coupled with poor concentration, tearful spells, and confusion about the future. (Status: maintained) -- No Description Entered  ?Unresolved grief issues. (Status: maintained) -- No Description Entered  ?Medication Status ?compliance  ?Safety ?none  ?If Suicidal or Homicidal State Action Taken: unspecified  ?Current Risk: low ?Medications ?Klonapin (Dosage: '1mg'$ )  ?Lexapro (Dosage: '30mg'$ )  ?xyprexa (Dosage: 2.'5mg'$ /day)  ?Objectives ?Related Problem: Stabilize anxiety level while increasing ability to function on a daily basis. ?Description: Learn and implement problem-solving strategies for realistically addressing worries. ?Target Date: 2021-09-22 ?Frequency: Daily ?Modality: individual ?Progress: 50%   ?Related Problem: Stabilize anxiety level while increasing ability to function on a daily basis. ?Description: Learn and implement calming skills to reduce overall anxiety and manage anxiety symptoms. ?Target Date: 2021-09-22 ?Frequency: Daily ?Modality: individual ?Progress: 40% ? ?Related Problem: Stabilize anxiety level while increasing ability to function on a daily basis. ?Description: Describe situations, thoughts, feelings, and actions associated with anxieties and worries, their impact on functioning, and attempts to resolve them. ?Target Date: 2021-09-22 ?Frequency: Daily ?Modality: individual ?Progress: 50%  ?Client Response ?full compliance  ?Service Location ?Location, [address] [place], [place] [address]  ?Service Code ?cpt [id number]  ?Normalize/Reframe  ?Facilitate problem solving  ?Identified an insight  ?Identify automatic thoughts  ?Integration of affect  ?Distress tolerance skill  ?Self care activities  ?Journaling  ?Self-monitoring  ?Session Notes: ?Dx.: Depression, Anxiety and Eating Disorder ?Meds: Neurontin, Klonapin, Paxil, Zophran (nausea), Magnesium, Lybalbi, Praosin.  ?Goals: Patient reports extreme levels of debilitating anxiety. She is seeking to reduce symptoms that interfere with day to day functioning. She wants to resolve her chronic fears of death and being abandoned. Goal date is 09-2020. Patient is having numerous medical symptoms and has episodes of fear that she is going to die. Revised goal date is 2-23. In addition, she is experiencing complicated grief related to the death of her mother. She reports feeling "disconnected" from her surroundings. Goal mostly achieved 09-2020. Will continue to address this issue with additional sessions. Goal 8-23.  ?Patient agrees to a video (Webex) session due to the pandemic. She is at home and I am in my home office. ? ?[name] says her doctor is referring her to a GI specialist. She says that she is thinking of her weight at all times. I  have encouraged her to consider an eating disorders treatment program. She insists that her primary care doctor has a specialty in Eating Disorders and can help her manage.  Her binge purge cycle is sporadic. Her concern is that she would miss work and does not know what she would with her dogs. She does say she will "think about it".  ?She talked about her unresolved grief. Reports that with each death in her family, she never took off work. It served to help her avoid the grieving process. She says that she is frustrated about her inability to control her anxiety and obsession with eating/weight.                         ? ? ?Janice Morel, PhD Time: 7:40a-8:30a 77mnutes ? ?   ? ? ? ? ? ? ? ? ? ? ? ? ? ? ? ? ? ? ? ? ? ? ? ? ? ? ? ? ? ? ? ? ? ? ? ? ? ? ? ? ?

## 2021-04-29 ENCOUNTER — Ambulatory Visit (INDEPENDENT_AMBULATORY_CARE_PROVIDER_SITE_OTHER): Payer: Medicare Other | Admitting: Psychology

## 2021-04-29 DIAGNOSIS — F4322 Adjustment disorder with anxiety: Secondary | ICD-10-CM

## 2021-04-29 NOTE — Progress Notes (Addendum)
? ? ? ? ? ?Janice Morel, PhD ? ? ?[name] is a 66 y.o. female patient  ? ?Date: 04/29/2021  ?Treatment Plan ?Diagnosis ?F43.21 (Adjustment Disorder, With depressed mood) [n/a]  ?F41.1 (Generalized anxiety disorder) [n/a]  ?F50.9 (Unspecified feeding or eating disorder) [n/a]  ?L46.50 (Uncomplicated bereavement) [n/a]  ?Symptoms ?Diminished interest in or enjoyment of activities. (Status: maintained) -- No Description Entered  ?Excessive and/or unrealistic worry that is difficult to control occurring more days than not for at least 6 months about a number of events or activities. (Status: maintained) -- No Description Entered  ?Intense fear of gaining weight or becoming fat, even though underweight. (Status: maintained) -- No Description Entered  ?Lack of energy. (Status: maintained) -- No Description Entered  ?Persistent preoccupation with body image related to grossly inaccurate assessment of self as overweight. (Status: maintained) -- No Description Entered  ?Recurrent inappropriate compensatory behaviors in order to prevent weight gain, such as self-induced vomiting; misuse of laxatives, diuretics, enemas, or other medications; fasting; or excessive exercise. (Status: maintained) -- No Description Entered  ?Thoughts dominated by loss coupled with poor concentration, tearful spells, and confusion about the future. (Status: maintained) -- No Description Entered  ?Unresolved grief issues. (Status: maintained) -- No Description Entered  ?Medication Status ?compliance  ?Safety ?none  ?If Suicidal or Homicidal State Action Taken: unspecified  ?Current Risk: low ?Medications ?Klonapin (Dosage: '1mg'$ )  ?Lexapro (Dosage: '30mg'$ )  ?xyprexa (Dosage: 2.'5mg'$ /day)  ?Objectives ?Related Problem: Stabilize anxiety level while increasing ability to function on a daily basis. ?Description: Learn and implement problem-solving strategies for realistically addressing worries. ?Target Date: 2021-09-22 ?Frequency:  Daily ?Modality: individual ?Progress: 50%  ?Related Problem: Stabilize anxiety level while increasing ability to function on a daily basis. ?Description: Learn and implement calming skills to reduce overall anxiety and manage anxiety symptoms. ?Target Date: 2021-09-22 ?Frequency: Daily ?Modality: individual ?Progress: 40% ? ?Related Problem: Stabilize anxiety level while increasing ability to function on a daily basis. ?Description: Describe situations, thoughts, feelings, and actions associated with anxieties and worries, their impact on functioning, and attempts to resolve them. ?Target Date: 2021-09-22 ?Frequency: Daily ?Modality: individual ?Progress: 50%  ?Client Response ?full compliance  ?Service Location ?Location, [address] [place],  [address]  ?Service Code ?cpt [id number]  ?Normalize/Reframe  ?Facilitate problem solving  ?Identified an insight  ?Identify automatic thoughts  ?Integration of affect  ?Distress tolerance skill  ?Self care activities  ?Journaling  ?Self-monitoring  ?Session Notes: ?Dx.: Depression, Anxiety and Eating Disorder ?Meds: Neurontin, Klonapin, Paxil, Zophran (nausea), Magnesium, Lybalbi, Praosin.  ?Goals: Patient reports extreme levels of debilitating anxiety. She is seeking to reduce symptoms that interfere with day to day functioning. She wants to resolve her chronic fears of death and being abandoned. Goal date is 01-2022. In addition, she is experiencing complicated grief related to the death of her mother. She reports feeling "disconnected" from her surroundings. Goal mostly achieved. Will continue to address this issue with additional sessions. Goal 12-23. Patient has past and current history of eating disorder. Will encourage additional specialized treatment by a treatment program or appropriately trained clinician. Goal date 12-23 ?Patient agrees to a video (Webex) session due to the pandemic. She is at home and I am in my home office. ? ?[name] had major oral  surgery this week (triple implant). She has started weight watchers, which is a better and healthier option than how she has been eating. Her concern is her obsessive nature and tendency to overthink, which is contra indicated on this  diet. She is in touch with ex boyfriend [name] and he is apologetic to her for the way he exited the relationship. She is not sure if she can trust him or the relationship (friendship). She is reluctant to bring up the past with him. We discussed how to approach the relationship in a way that she can feel emotionally protected.                            ? ? ?Janice Morel, PhD Time: 7:40a-8:30a 61mnutes ? ?   ? ? ? ? ? ? ? ? ? ? ? ? ? ? ? ? ? ? ? ? ? ? ? ? ? ? ? ? ? ? ? ? ? ? ? ? ? ? ? ? ?

## 2021-05-06 ENCOUNTER — Ambulatory Visit (INDEPENDENT_AMBULATORY_CARE_PROVIDER_SITE_OTHER): Payer: Medicare Other | Admitting: Psychology

## 2021-05-06 DIAGNOSIS — F4322 Adjustment disorder with anxiety: Secondary | ICD-10-CM

## 2021-05-06 NOTE — Progress Notes (Signed)
? ? ? ? ?[name], PhD ? ? ?[name] is a 66 y.o. female patient  ? ?Date: 05/06/2021  ?Treatment Plan ?Diagnosis ?F43.21 (Adjustment Disorder, With depressed mood) [n/a]  ?F41.1 (Generalized anxiety disorder) [n/a]  ?F50.9 (Unspecified feeding or eating disorder) [n/a]  ?A83.41 (Uncomplicated bereavement) [n/a]  ?Symptoms ?Diminished interest in or enjoyment of activities. (Status: maintained) -- No Description Entered  ?Excessive and/or unrealistic worry that is difficult to control occurring more days than not for at least 6 months about a number of events or activities. (Status: maintained) -- No Description Entered  ?Intense fear of gaining weight or becoming fat, even though underweight. (Status: maintained) -- No Description Entered  ?Lack of energy. (Status: maintained) -- No Description Entered  ?Persistent preoccupation with body image related to grossly inaccurate assessment of self as overweight. (Status: maintained) -- No Description Entered  ?Recurrent inappropriate compensatory behaviors in order to prevent weight gain, such as self-induced vomiting; misuse of laxatives, diuretics, enemas, or other medications; fasting; or excessive exercise. (Status: maintained) -- No Description Entered  ?Thoughts dominated by loss coupled with poor concentration, tearful spells, and confusion about the future. (Status: maintained) -- No Description Entered  ?Unresolved grief issues. (Status: maintained) -- No Description Entered  ?Medication Status ?compliance  ?Safety ?none  ?If Suicidal or Homicidal State Action Taken: unspecified  ?Current Risk: low ?Medications ?Klonapin (Dosage: '1mg'$ )  ?Lexapro (Dosage: '30mg'$ )  ?xyprexa (Dosage: 2.'5mg'$ /day)  ?Objectives ?Related Problem: Stabilize anxiety level while increasing ability to function on a daily basis. ?Description: Learn and implement problem-solving strategies for realistically addressing worries. ?Target Date: 2021-09-22 ?Frequency:  Daily ?Modality: individual ?Progress: 50%  ?Related Problem: Stabilize anxiety level while increasing ability to function on a daily basis. ?Description: Learn and implement calming skills to reduce overall anxiety and manage anxiety symptoms. ?Target Date: 2021-09-22 ?Frequency: Daily ?Modality: individual ?Progress: 40% ? ?Related Problem: Stabilize anxiety level while increasing ability to function on a daily basis. ?Description: Describe situations, thoughts, feelings, and actions associated with anxieties and worries, their impact on functioning, and attempts to resolve them. ?Target Date: 2021-09-22 ?Frequency: Daily ?Modality: individual ?Progress: 50%  ?Client Response ?full compliance  ?Service Location ?Location, [address] [place], [place] [address]  ?Service Code ?cpt [id number]  ?Normalize/Reframe  ?Facilitate problem solving  ?Identified an insight  ?Identify automatic thoughts  ?Integration of affect  ?Distress tolerance skill  ?Self care activities  ?Journaling  ?Self-monitoring  ?Session Notes: ?Dx.: Depression, Anxiety and Eating Disorder ?Meds: Neurontin, Klonapin, Paxil, Zophran (nausea), Magnesium, Lybalbi, Praosin.  ?Goals: Patient reports extreme levels of debilitating anxiety. She is seeking to reduce symptoms that interfere with day to day functioning. She wants to resolve her chronic fears of death and being abandoned. Goal date is 09-2020. Patient is having numerous medical symptoms and has episodes of fear that she is going to die. Revised goal date is 2-23. In addition, she is experiencing complicated grief related to the death of her mother. She reports feeling "disconnected" from her surroundings. Goal mostly achieved 09-2020. Will continue to address this issue with additional sessions. Goal 8-23.  ?Patient agrees to a video (Webex) session due to the pandemic. She is at home and I am in my home office. ? ?[name] says she traveled to [place] to go to [employer] "to get  energized". She talked about her chronic anxiety as limiting her personal and professional growth. 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 She fears that she is not capable to learn the science/biology necessary to be successful on the exam. Encouraged her to get a tutor and make an attempt to take the exam.  ?She says that she has not talked to her brother in a month. She states that he just complains about being old and feeling terrible. He is depressed and it brings her down. She prefers to avoid having contact with him. She continues to be obsessed about her weight. Is trying the weight watchers program and is not currently binging or purging. Her mindset, however, is very much that of someone with an eating disorder. Distorted self image is very pronounced. Reinforced her need to get specialized treatment for her eating disorder. ? ?                             ? ? ?[name], PhD Time: 7:40a-8:30a 68mnutes ? ?   ? ? ? ? ? ? ? ? ? ? ? ? ? ? ? ? ? ? ? ? ? ? ? ? ? ? ? ? ? ? ? ? ? ? ? ? ? ? ? ? ?

## 2021-05-13 ENCOUNTER — Ambulatory Visit (INDEPENDENT_AMBULATORY_CARE_PROVIDER_SITE_OTHER): Payer: Medicare Other | Admitting: Psychology

## 2021-05-13 DIAGNOSIS — F4322 Adjustment disorder with anxiety: Secondary | ICD-10-CM | POA: Diagnosis not present

## 2021-05-13 DIAGNOSIS — F509 Eating disorder, unspecified: Secondary | ICD-10-CM

## 2021-05-13 NOTE — Progress Notes (Signed)
? ? ? ? ? ? ? ? ? ? ? ? ? ? ? ? ? ? ? ?Janice Morel, PhD ? ? ?[name] is a 66 y.o. female patient  ? ?Date: 05/13/2021  ?Treatment Plan ?Diagnosis ?F43.21 (Adjustment Disorder, With depressed mood) [n/a]  ?F41.1 (Generalized anxiety disorder) [n/a]  ?F50.9 (Unspecified feeding or eating disorder) [n/a]  ?I26.41 (Uncomplicated bereavement) [n/a]  ?Symptoms ?Diminished interest in or enjoyment of activities. (Status: maintained) -- No Description Entered  ?Excessive and/or unrealistic worry that is difficult to control occurring more days than not for at least 6 months about a number of events or activities. (Status: maintained) -- No Description Entered  ?Intense fear of gaining weight or becoming fat, even though underweight. (Status: maintained) -- No Description Entered  ?Lack of energy. (Status: maintained) -- No Description Entered  ?Persistent preoccupation with body image related to grossly inaccurate assessment of self as overweight. (Status: maintained) -- No Description Entered  ?Recurrent inappropriate compensatory behaviors in order to prevent weight gain, such as self-induced vomiting; misuse of laxatives, diuretics, enemas, or other medications; fasting; or excessive exercise. (Status: maintained) -- No Description Entered  ?Thoughts dominated by loss coupled with poor concentration, tearful spells, and confusion about the future. (Status: maintained) -- No Description Entered  ?Unresolved grief issues. (Status: maintained) -- No Description Entered  ?Medication Status ?compliance  ?Safety ?none  ?If Suicidal or Homicidal State Action Taken: unspecified  ?Current Risk: low ?Medications ?Klonapin (Dosage: '1mg'$ )  ?Lexapro (Dosage: '30mg'$ )  ?xyprexa (Dosage: 2.'5mg'$ /day)  ?Objectives ?Related Problem: Stabilize anxiety level while increasing ability to function on a daily basis. ?Description: Learn and implement problem-solving strategies for realistically addressing worries. ?Target Date:  2021-09-22 ?Frequency: Daily ?Modality: individual ?Progress: 50%  ?Related Problem: Stabilize anxiety level while increasing ability to function on a daily basis. ?Description: Learn and implement calming skills to reduce overall anxiety and manage anxiety symptoms. ?Target Date: 2021-09-22 ?Frequency: Daily ?Modality: individual ?Progress: 40% ? ?Related Problem: Stabilize anxiety level while increasing ability to function on a daily basis. ?Description: Describe situations, thoughts, feelings, and actions associated with anxieties and worries, their impact on functioning, and attempts to resolve them. ?Target Date: 2021-09-22 ?Frequency: Daily ?Modality: individual ?Progress: 50%  ?Client Response ?full compliance  ?Service Location ?Location, [name] [name], [name]  ?Service Code ?cpt [id number]  ?Normalize/Reframe  ?Facilitate problem solving  ?Identified an insight  ?Identify automatic thoughts  ?Integration of affect  ?Distress tolerance skill  ?Self care activities  ?Journaling  ?Self-monitoring  ?Session Notes: ?Dx.: Depression, Anxiety and Eating Disorder ?Meds: Neurontin, Klonapin, Paxil, Zophran (nausea), Magnesium, Lybalbi, Praosin.  ?Goals: Patient reports extreme levels of debilitating anxiety. She is seeking to reduce symptoms that interfere with day to day functioning. She wants to resolve her chronic fears of death and being abandoned. Goal date is 09-2020. Patient is having numerous medical symptoms and has episodes of fear that she is going to die. Revised goal date is 2-23. In addition, she is experiencing complicated grief related to the death of her mother. She reports feeling "disconnected" from her surroundings. Goal mostly achieved 09-2020. Will continue to address this issue with additional sessions. Goal 8-23.  ?Patient agrees to a video (Webex) session due to the pandemic. She is at home and I am in my home office. ? ?[name] talked about the possible origins of her  anxiety. She has fears that there are some medical conditions that have not been identified. She is meeting with an eating disorder  specialist next week [name]). She has been to this practice in the past, but Janice Pontes is not the provider she had seen. I told [name] that she needs to be addressing the psychological aspects of her eating disorder with a specialist. She is going to explore with the dietician and determine if that practice can offer that service. I continue to encourage [name] to address her eating disorder medically and psychologically as we work on managing her anxiety.  ?She states that [name] has again ghosted her. He had suggested they have coffee every week and now will not respond to her. I suggested that her current surge with her eating disorder may be related to this, but she rejects that notion.  ?Asked what she is currently doing to relieve am anxiety. She states she talks with sister in law and [name] every morning. It helps her feel grounded and more distracted. 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 The act of studying is a good distraction as well and helps her anxiety. ? ?                             ? ? ?Janice Morel, PhD Time: 7:35a-8:30a 57mnutes ? ?   ? ? ? ? ? ? ? ? ? ? ? ? ? ? ? ? ? ? ? ? ? ? ? ? ? ? ? ? ? ? ? ? ? ? ? ? ? ? ? ? ?

## 2021-05-20 ENCOUNTER — Ambulatory Visit: Payer: Medicare Other | Admitting: Psychology

## 2021-05-20 ENCOUNTER — Ambulatory Visit (INDEPENDENT_AMBULATORY_CARE_PROVIDER_SITE_OTHER): Payer: Medicare Other | Admitting: Psychology

## 2021-05-20 DIAGNOSIS — F4322 Adjustment disorder with anxiety: Secondary | ICD-10-CM

## 2021-05-20 NOTE — Progress Notes (Signed)
Janice Morel, PhD ? ? ?[name] is a 66 y.o. female patient  ? ?Date: 05/20/2021  ?Treatment Plan ?Diagnosis ?F43.21 (Adjustment Disorder, With depressed mood) [n/a]  ?F41.1 (Generalized anxiety disorder) [n/a]  ?F50.9 (Unspecified feeding or eating disorder) [n/a]  ?L97.67 (Uncomplicated bereavement) [n/a]  ?Symptoms ?Diminished interest in or enjoyment of activities. (Status: maintained) -- No Description Entered  ?Excessive and/or unrealistic worry that is difficult to control occurring more days than not for at least 6 months about a number of events or activities. (Status: maintained) -- No Description Entered  ?Intense fear of gaining weight or becoming fat, even though underweight. (Status: maintained) -- No Description Entered  ?Lack of energy. (Status: maintained) -- No Description Entered  ?Persistent preoccupation with body image related to grossly inaccurate assessment of self as overweight. (Status: maintained) -- No Description Entered  ?Recurrent inappropriate compensatory behaviors in order to prevent weight gain, such as self-induced vomiting; misuse of laxatives, diuretics, enemas, or other medications; fasting; or excessive exercise. (Status: maintained) -- No Description Entered  ?Thoughts dominated by loss coupled with poor concentration, tearful spells, and confusion about the future. (Status: maintained) -- No Description Entered  ?Unresolved grief issues. (Status: maintained) -- No Description Entered  ?Medication Status ?compliance  ?Safety ?none  ?If Suicidal or Homicidal State Action Taken: unspecified  ?Current Risk: low ?Medications ?Klonapin (Dosage: '1mg'$ )  ?Lexapro (Dosage: '30mg'$ )  ?xyprexa (Dosage: 2.'5mg'$ /day)  ?Objectives ?Related Problem: Stabilize anxiety level while increasing ability to function on a daily basis. ?Description: Learn and implement problem-solving strategies for realistically addressing worries. ?Target Date: 2021-09-22 ?Frequency: Daily ?Modality:  individual ?Progress: 50%  ?Related Problem: Stabilize anxiety level while increasing ability to function on a daily basis. ?Description: Learn and implement calming skills to reduce overall anxiety and manage anxiety symptoms. ?Target Date: 2021-09-22 ?Frequency: Daily ?Modality: individual ?Progress: 40% ? ?Related Problem: Stabilize anxiety level while increasing ability to function on a daily basis. ?Description: Describe situations, thoughts, feelings, and actions associated with anxieties and worries, their impact on functioning, and attempts to resolve them. ?Target Date: 2021-09-22 ?Frequency: Daily ?Modality: individual ?Progress: 50%  ?Client Response ?full compliance  ?Service Location ?Location, [address] [place], [place] [address]  ?Service Code ?cpt [id number]  ?Normalize/Reframe  ?Facilitate problem solving  ?Identified an insight  ?Identify automatic thoughts  ?Integration of affect  ?Distress tolerance skill  ?Self care activities  ?Journaling  ?Self-monitoring  ?Session Notes: ?Dx.: Depression, Anxiety and Eating Disorder ?Meds: Neurontin, Klonapin, Paxil, Zophran (nausea), Magnesium, Lybalbi, Praosin.  ?Goals: Patient reports extreme levels of debilitating anxiety. She is seeking to reduce symptoms that interfere with day to day functioning. She wants to resolve her chronic fears of death and being abandoned. Goal date is 09-2020. Patient is having numerous medical symptoms and has episodes of fear that she is going to die. Revised goal date is 2-23. In addition, she is experiencing complicated grief related to the death of her mother. She reports feeling "disconnected" from her surroundings. Goal mostly achieved 09-2020. Will continue to address this issue with additional sessions. Goal 8-23.  ?Patient agrees to a video (Webex) session due to the pandemic. She is at home and I am in my home office. ? ?[name] says she heard that [name] is doing very poorly and may have to go into skilled  nursing. Significant decline. This is why she has not heard from him. She went to go see him at his home and he looked terrible. Her psychiatrist wants her to stop Zyprexa  and start Lybalvi ('5mg'$ ). She took it 2 times and said it sedated her and made her dizzy. She will not take it again. She is undecided about going back to the Zyprexa. She has appointment with an eating disorders nutritionist today. She has had good eating for the past 2 days. She struggles to understand why she sabotages herself by her binge eating. Will contact after her appointment with the specialist later this morning.     ? ?                             ? ? ?Janice Morel, PhD Time: 7:35a-8:30a 83mnutes ? ?   ? ? ? ? ? ? ? ? ? ? ? ? ? ? ? ? ? ? ? ? ? ? ? ? ? ? ? ? ? ? ? ? ? ? ? ? ? ? ? ? ?

## 2021-05-25 ENCOUNTER — Other Ambulatory Visit: Payer: Self-pay

## 2021-05-27 ENCOUNTER — Ambulatory Visit (INDEPENDENT_AMBULATORY_CARE_PROVIDER_SITE_OTHER): Payer: Medicare Other | Admitting: Psychology

## 2021-05-27 DIAGNOSIS — F4322 Adjustment disorder with anxiety: Secondary | ICD-10-CM | POA: Diagnosis not present
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
## 2021-06-02 ENCOUNTER — Ambulatory Visit (INDEPENDENT_AMBULATORY_CARE_PROVIDER_SITE_OTHER): Payer: Medicare Other | Admitting: Physician Assistant

## 2021-06-02 ENCOUNTER — Encounter: Payer: Self-pay | Admitting: Physician Assistant

## 2021-06-02 VITALS — BP 110/60 | HR 67 | Ht 63.0 in

## 2021-06-02 DIAGNOSIS — K219 Gastro-esophageal reflux disease without esophagitis: Secondary | ICD-10-CM

## 2021-06-02 DIAGNOSIS — R1013 Epigastric pain: Secondary | ICD-10-CM | POA: Diagnosis not present

## 2021-06-02 DIAGNOSIS — F502 Bulimia nervosa: Secondary | ICD-10-CM | POA: Diagnosis not present

## 2021-06-02 DIAGNOSIS — K5909 Other constipation: Secondary | ICD-10-CM | POA: Diagnosis not present

## 2021-06-02 DIAGNOSIS — G8929 Other chronic pain: Secondary | ICD-10-CM

## 2021-06-02 NOTE — Patient Instructions (Signed)
If you are age 68 or older, your body mass index should be between 23-30. Your Body mass index is 19.84 kg/m?Marland Kitchen If this is out of the aforementioned range listed, please consider follow up with your Primary Care Provider. ? ?If you are age 94 or younger, your body mass index should be between 19-25. Your Body mass index is 19.84 kg/m?Marland Kitchen If this is out of the aformentioned range listed, please consider follow up with your Primary Care Provider.  ? ?________________________________________________________ ? ?The [facility] providers would like to encourage you to use [facility] to communicate with providers for non-urgent requests or questions.  Due to long hold times on the telephone, sending your provider a message by [facility] may be a faster and more efficient way to get a response.  Please allow 48 business hours for a response.  Please remember that this is for non-urgent requests.  ?_______________________________________________________ ? ?Morning-  ?Pantoprazole '40mg'$   ?Miralax ? ?Evening- ?Famotidine 20 mg  ?Miralax  ? ?It was a pleasure to see you today! ? ?Thank you for trusting me with your gastrointestinal care!   ?  ?

## 2021-06-02 NOTE — Progress Notes (Signed)
? ?Chief Complaint: Abdominal pain, bloating and constipation ? ?HPI: ?   [name] is a 66 year old female with a past medical history as listed below including bulimia nervosa, and generalized anxiety disorder, known to Dr. Silverio Decamp, who was referred to me by Harrison Mons, PA for a complaint of abdominal pain, bloating and constipation. ?   07/24/2018 Cologuard negative ?   11/19/2020 office visit with Dr. Silverio Decamp for abdominal pain, nausea and constipation.  At that time symptoms thought mostly related to eating disorder.  Recommended small frequent meals and Carafate 1 g 3 times daily.  Also added Pepcid 20 mg daily as needed.  Recent upper GI series with no sufficient pathology.  Nausea felt multifactorial secondary to eating disorder.  At that time patient advised to stop excessive amount of Ex-Lax often 50 tablets every other day.  Started on MiraLAX once daily titrated up as needed to have 1-2 soft bowel once daily. ?   03/18/2021 CBC normal. ?   Today, the patient tells me that she continues with problems.  Tells me that most recently typically when she wakes up in the morning she will have an excruciating burning epigastric pain and feels somewhat lightheaded and then throughout the day develops lower abdominal cramps and feels like her abdomen grows increasingly distended until the evening when she looks "6 months pregnant".  Tells me that she continues to battle constipation and oftentimes has to do manual disimpaction. She will then sometimes have diarrhea and other times have mushy stool.  Does tell me that she used to take Ex-Lax 50 tablets every other day and now just does this once a week.  She knows this is still bad for her and is trying to work on everything including her bulimia nervosa.  Does still make herself vomit at times.  Currently she inconsistently uses her Pantoprazole 40 mg in the morning and Famotidine 20 mg at night.  Tells me she never tried the MiraLAX consistently in the  past. ?   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 ?   Denies fever, chills, blood in her stool or symptoms that awaken her from sleep. ? ?Past Medical History:  ?Diagnosis Date  ? Acute pain of right knee 03/07/2017  ? Anemia   ? Anorexia   ? Anxiety   ? Bitemporal hemianopsia   ? Borderline personality disorder ([place])   ? Bulimia   ? Depression   ? Hyperlipidemia   ? Ocular migraine   ? Osteoporosis   ? Pigmentary retinal dystrophy   ? Retinitis pigmentosa   ? TBI (traumatic brain injury) ([place])   ? ? ?Past Surgical History:  ?Procedure Laterality Date  ? CESAREAN SECTION    ? ? ?Current Outpatient Medications  ?Medication Sig Dispense Refill  ? clonazePAM (KLONOPIN) 1 MG tablet Take 1 tablet by mouth daily.    ? escitalopram (LEXAPRO) 20 MG tablet Take 30 mg by mouth daily.    ? famotidine (PEPCID) 20 MG tablet Take 1 tablet (20 mg total) by mouth daily as needed for heartburn or indigestion. 30 tablet 5  ? gatifloxacin (ZYMAXID) 0.5 % SOLN gatifloxacin 0.5 % eye drops ? INSTILL 1 DROP INTO RIGHT EYE 4 TIMES A DAY AS DIRECTED    ? lisdexamfetamine (VYVANSE) 20 MG capsule Vyvanse 20 mg capsule    ? MAGNESIUM PO Take 1 tablet by mouth daily as needed (muscle fatigue).    ? OLANZapine (ZYPREXA) 2.5 MG tablet Take 1  tablet by mouth every 14 (fourteen) days.    ? PARoxetine (PAXIL) 10 MG tablet daily.    ? polyethylene glycol (MIRALAX / GLYCOLAX) 17 g packet 2 capfuls    ? sucralfate (CARAFATE) 1 g tablet Take 1 tablet (1 g total) by mouth 3 (three) times daily as needed. 90 tablet 5  ? vortioxetine HBr (TRINTELLIX) 5 MG TABS tablet Trintellix 5 mg tablet    ? ?No current facility-administered medications for this visit.  ? ? ?Allergies as of 06/02/2021 - Review Complete 06/02/2021  ?Allergen Reaction Noted  ? Carbamazepine Palpitations 03/13/2014  ? Prochlorperazine edisylate  08/18/2011  ? Keflex [cephalexin] Rash and Other (See Comments) 07/16/2014  ? Augmentin [amoxicillin-pot  clavulanate] Diarrhea   ? Compazine [prochlorperazine]  11/19/2020  ? Epinephrine Palpitations 08/18/2011  ? ? ?Family History  ?Problem Relation Age of Onset  ? Cancer Father   ? Heart disease Father   ?     CHF  ? Diabetes Sister   ?     Obesity  ? Polymyalgia rheumatica Mother   ? Osteoporosis Mother   ? ? ?Social History  ? ?Socioeconomic History  ? Marital status: Divorced  ?  Spouse name: Not on file  ? Number of children: 1  ? Years of education: Not on file  ? Highest education level: Not on file  ?Occupational History  ? Occupation: Hotel manager education  ?  Employer: [employer]  ? Occupation: Designer, fashion/clothing  ? Occupation: Contractor  ? Occupation: Regulatory affairs officer  ?  Comment: for person's with Parkinson's Disease  ?Tobacco Use  ? Smoking status: Never  ? Smokeless tobacco: Never  ?Vaping Use  ? Vaping Use: Never used  ?Substance and Sexual Activity  ? Alcohol use: No  ? Drug use: No  ? Sexual activity: Not on file  ?Other Topics Concern  ? Not on file  ?Social History Narrative  ? ** Merged History Encounter **  ?    ? ?Social Determinants of Health  ? ?Financial Resource Strain: Not on file  ?Food Insecurity: Not on file  ?Transportation Needs: Not on file  ?Physical Activity: Not on file  ?Stress: Not on file  ?Social Connections: Not on file  ?Intimate Partner Violence: Not on file  ? ? ?Review of Systems:    ?Constitutional: No weight loss, fever or chills ?Cardiovascular: No chest pain  ?Respiratory: No SOB  ?Gastrointestinal: See HPI and otherwise negative ? ? Physical Exam:  ?Vital signs: ?BP 110/60 (BP Location: Left Arm, Patient Position: Sitting, Cuff Size: Normal)   Pulse 67   Ht '5\' 3"'$  (1.6 m) Comment: measured without shoes  LMP 02/18/2004   BMI 19.84 kg/m?   ?Constitutional:   Pleasant Caucasian female appears to be in NAD, Well developed, Well nourished, alert and cooperative ?Respiratory: Respirations even and unlabored. Lungs clear to auscultation bilaterally.   No wheezes,  crackles, or rhonchi.  ?Cardiovascular: Normal S1, S2. No MRG. Regular rate and rhythm. No peripheral edema, cyanosis or pallor.  ?Gastrointestinal:  Soft, nondistended, mild epigastric and lower abdominal TTP. No rebound or guarding. Normal bowel sounds. No appreciable masses or hepatomegaly. ?Skin:   Dry and intact without significant lesions or rashes. ?Psychiatric: Demonstrates good judgement and reason without abnormal affect or behaviors. ? ?No recent labs or imaging. ? ?Assessment: ?1.  GERD and epigastric pain: Typically worse in the morning, her Pantoprazole and Famotidine do help but she does not consistently take them; likely underlying gastritis from bulimia ?2.  Lower abdominal pain: With constipation as below ?3.  Constipation: Exacerbated by eating disorder, not on a regular regimen ?4.  History of bulimia nervosa: Still battles with this ? ?Plan: ?1.  Again discussed that most of the patient's problems are related to her eating disorder.  The fact that she takes 50 Ex-Lax once a week will mess with her bowel movements.  Encouraged her to try and get on a more regular regimen.  We discussed using MiraLAX twice a day, every morning and nightly.  This can be increased/titrated up to 4 times a day for more regular bowel movements.  Discussed this in detail. ?2.  Also recommend the patient consistently use her Pantoprazole 40 mg a.m. and Famotidine 20 mg nightly. ?3.  If patient is able to do above and is still having problems then will need to discuss EGD/colonoscopy.  Did discuss the patient's colon cancer screening is up-to-date for now but she will need to repeat a Cologuard or have other form of screening by June of this year. ?4.  Patient to follow in clinic with me in 1 to 2 months or sooner if necessary. ? ?Ellouise Newer, PA-C ?[facility] ?06/02/2021, 9:13 AM ? ?Cc: Harrison Mons, PA  ?

## 2021-06-03 ENCOUNTER — Ambulatory Visit: Payer: Medicare Other | Admitting: Psychology

## 2021-06-10 ENCOUNTER — Ambulatory Visit: Payer: Medicare Other | Admitting: Psychology

## 2021-06-17 ENCOUNTER — Ambulatory Visit (INDEPENDENT_AMBULATORY_CARE_PROVIDER_SITE_OTHER): Payer: Medicare Other | Admitting: Psychology

## 2021-06-17 DIAGNOSIS — F4322 Adjustment disorder with anxiety: Secondary | ICD-10-CM

## 2021-06-17 NOTE — Progress Notes (Signed)
? ? ? ? ? ? ? ? ? ? ? ? ? ? ? ? ?[name] is a 66 y.o. female patient  ? 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 (Status: maintained) -- No Description Entered  ?Excessive and/or unrealistic worry that is difficult to control occurring more days than not for at least 6 months about a number of events or activities. (Status: maintained) -- No Description Entered  ?Intense fear of gaining weight or becoming fat, even though underweight. (Status: maintained) -- No Description Entered  ?Lack of energy. (Status: maintained) -- No Description Entered  ?Persistent preoccupation with body image related to grossly inaccurate assessment of self as overweight. (Status: maintained) -- No Description Entered  ?Recurrent inappropriate compensatory behaviors in order to prevent weight gain, such as self-induced vomiting; misuse of laxatives, diuretics, enemas, or other medications; fasting; or excessive exercise. (Status: maintained) -- No Description Entered  ?Thoughts dominated by loss coupled with poor concentration, tearful spells, and confusion about the future. (Status: maintained) -- No Description Entered  ?Unresolved grief issues. (Status: maintained) -- No Description Entered  ?Medication Status ?compliance  ?Safety ?none  ?If Suicidal or Homicidal State Action Taken: unspecified  ?Current Risk: low ?Medications ?Klonapin (Dosage: '1mg'$ )  ?Lexapro (Dosage: '30mg'$ )  ?xyprexa (Dosage: 2.'5mg'$ /day)  ?Objectives ?Related Problem: Stabilize anxiety level while increasing ability to function on a daily basis. ?Description: Learn and implement problem-solving strategies for realistically addressing worries. ?Target Date: 2021-09-22 ?Frequency: Daily ?Modality:  individual ?Progress: 50%  ?Related Problem: Stabilize anxiety level while increasing ability to function on a daily basis. ?Description: Learn and implement calming skills to reduce overall anxiety and manage anxiety symptoms. ?Target Date: 2021-09-22 ?Frequency: Daily ?Modality: individual ?Progress: 40% ? ?Related Problem: Stabilize anxiety level while increasing ability to function on a daily basis. ?Description: Describe situations, thoughts, feelings, and actions associated with anxieties and worries, their impact on functioning, and attempts to resolve them. ?Target Date: 2021-09-22 ?Frequency: Daily ?Modality: individual ?Progress: 50%  ?Client Response ?full compliance  ?Service Location ?Location, [address] [place], [place] [address]  ?Service Code ?cpt [id number]  ?Normalize/Reframe  ?Facilitate problem solving  ?Identified an insight  ?Identify automatic thoughts  ?Integration of affect  ?Distress tolerance skill  ?Self care activities  ?Journaling  ?Self-monitoring  ?Session Notes: ?Dx.: Depression, Anxiety and Eating Disorder ?Meds: Neurontin, Klonapin, Paxil, Zophran (nausea), Magnesium, Lybalbi, Praosin.  ?Goals: Patient reports extreme levels of debilitating anxiety. She is seeking to reduce symptoms that interfere with day to day functioning. She wants to resolve her chronic fears of death and being abandoned. Goal date is 09-2020. Patient is having numerous medical symptoms and has episodes of fear that she is going to die. Revised goal date is 2-23. In addition, she is experiencing complicated grief related to the death of her mother. She reports feeling "disconnected" from her surroundings. Goal mostly achieved 09-2020. Will continue to address this issue with additional sessions. Goal 8-23.  ?Patient agrees to a video (Webex) session due to the pandemic. She is at home and I am in my home office. ? ?[name] says [name] is now in memory care. She has been visiting every day. [name]  is distressed about his terrible condition. He does not even recognize her. Her psychiatrist reduced Klonopin in half 2.5 mg Zyprexa  at night rather than the morning.  ?We talked about how to proceed with seeing [name]. She is finding it difficult and emotionally taxing.Suggested she reduce the frequency of visits. Her eating disorder has been fully active since our last session, likely related to the stress of the [place] situation. Needs to check in with her physician provider and we will talk other treatment options.        ? ?                             ? ? ?Janice Morel, PhD Time: 7:40a-8:30a 69mnutes ? ?   ? ? ? ? ? ? ? ? ? ? ? ? ? ? ? ? ? ? ? ? ? ? ? ? ? ? ? ? ? ? ? ? ? ? ? ? ? ? ? ? ?

## 2021-06-24 ENCOUNTER — Ambulatory Visit (INDEPENDENT_AMBULATORY_CARE_PROVIDER_SITE_OTHER): Payer: Medicare Other | Admitting: Psychology

## 2021-06-24 DIAGNOSIS — F4322 Adjustment disorder with anxiety: Secondary | ICD-10-CM | POA: Diagnosis not present

## 2021-06-24 NOTE — Progress Notes (Signed)
? ? ? ? ? ? ? ?[name] is a 66 y.o. female patient  ? 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 (Status: maintained) -- No Description Entered  ?Excessive and/or unrealistic worry that is difficult to control occurring more days than not for at least 6 months about a number of events or activities. (Status: maintained) -- No Description Entered  ?Intense fear of gaining weight or becoming fat, even though underweight. (Status: maintained) -- No Description Entered  ?Lack of energy. (Status: maintained) -- No Description Entered  ?Persistent preoccupation with body image related to grossly inaccurate assessment of self as overweight. (Status: maintained) -- No Description Entered  ?Recurrent inappropriate compensatory behaviors in order to prevent weight gain, such as self-induced vomiting; misuse of laxatives, diuretics, enemas, or other medications; fasting; or excessive exercise. (Status: maintained) -- No Description Entered  ?Thoughts dominated by loss coupled with poor concentration, tearful spells, and confusion about the future. (Status: maintained) -- No Description Entered  ?Unresolved grief issues. (Status: maintained) -- No Description Entered  ?Medication Status ?compliance  ?Safety ?none  ?If Suicidal or Homicidal State Action Taken: unspecified  ?Current Risk: low ?Medications ?Klonapin (Dosage: '1mg'$ )  ?Lexapro (Dosage: '30mg'$ )  ?xyprexa (Dosage: 2.'5mg'$ /day)  ?Objectives ?Related Problem: Stabilize anxiety level while increasing ability to function on a daily basis. ?Description: Learn and implement problem-solving strategies for realistically addressing worries. ?Target Date: 2021-09-22 ?Frequency: Daily ?Modality:  individual ?Progress: 50%  ?Related Problem: Stabilize anxiety level while increasing ability to function on a daily basis. ?Description: Learn and implement calming skills to reduce overall anxiety and manage anxiety symptoms. ?Target Date: 2021-09-22 ?Frequency: Daily ?Modality: individual ?Progress: 40% ? ?Related Problem: Stabilize anxiety level while increasing ability to function on a daily basis. ?Description: Describe situations, thoughts, feelings, and actions associated with anxieties and worries, their impact on functioning, and attempts to resolve them. ?Target Date: 2021-09-22 ?Frequency: Daily ?Modality: individual ?Progress: 50%  ?Client Response ?full compliance  ?Service Location ?Location, [address] [place], [place] [address]  ?Service Code ?cpt [id number]  ?Normalize/Reframe  ?Facilitate problem solving  ?Identified an insight  ?Identify automatic thoughts  ?Integration of affect  ?Distress tolerance skill  ?Self care activities  ?Journaling  ?Self-monitoring  ?Session Notes: ?Dx.: Depression, Anxiety and Eating Disorder ?Meds: Neurontin, Klonapin, Paxil, Zophran (nausea), Magnesium, Lybalbi, Praosin.  ?Goals: Patient reports extreme levels of debilitating anxiety. She is seeking to reduce symptoms that interfere with day to day functioning. She wants to resolve her chronic fears of death and being abandoned. Goal date is 09-2020. Patient is having numerous medical symptoms and has episodes of fear that she is going to die. Revised goal date is 2-23. In addition, she is experiencing complicated grief related to the death of her mother. She reports feeling "disconnected" from her surroundings. Goal mostly achieved 09-2020. Will continue to address this issue with additional sessions. Goal 8-23.  ?Patient agrees to a video (Webex) session due to the pandemic. She is at home and I am in my home office. ? ?[name] says she is no longer going to see [name] daily. Has reduced Klonapin from 2 a day to  1 a day. Psychiatrist feels she is doing better except for her GI issues. She is stressed about being told by [name] to reduce visits because he  does not want her there as often. Hard to believe he said that to his daughter. [name] feels guilty no going but fears he will reject her if she comes. ?She is continuing to be obsessed with her weight. She went to nutritionist, but does not want to continue due to the cost. She says that her dissociation is not as prevalent and she feels more stable. Her anxiety remains but is slightly less.  ?        ? ?                             ? ? ?Janice Morel, PhD Time: 7:40a-8:30a 31mnutes ? ?   ? ? ? ? ? ? ? ? ? ? ? ? ? ? ? ? ? ? ? ? ? ? ? ? ? ? ? ? ? ? ? ? ? ? ? ? ? ? ? ? ?

## 2021-07-01 ENCOUNTER — Ambulatory Visit (INDEPENDENT_AMBULATORY_CARE_PROVIDER_SITE_OTHER): Payer: Medicare Other | Admitting: Psychology

## 2021-07-01 DIAGNOSIS — F4322 Adjustment disorder with anxiety: Secondary | ICD-10-CM | POA: Diagnosis not present

## 2021-07-01 NOTE — Progress Notes (Signed)
[name] is a 66 y.o. 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 (Status: maintained) -- No Description Entered  Excessive and/or unrealistic worry that is difficult to control occurring more days than not for at least 6 months about a number of events or activities. (Status: maintained) -- No Description Entered  Intense fear of gaining weight or becoming fat, even though underweight. (Status: maintained) -- No Description Entered  Lack of energy. (Status: maintained) -- No Description Entered  Persistent preoccupation with body image related to grossly inaccurate assessment of self as overweight. (Status: maintained) -- No Description Entered  Recurrent inappropriate compensatory behaviors in order to prevent weight gain, such as self-induced vomiting; misuse of laxatives, diuretics, enemas, or other medications; fasting; or excessive exercise. (Status: maintained) -- No Description Entered  Thoughts dominated by loss coupled with poor concentration, tearful spells, and confusion about the future. (Status: maintained) -- No Description Entered  Unresolved grief issues. (Status: maintained) -- No Description Entered  Medication Status compliance  Safety none  If Suicidal or Homicidal State Action Taken: unspecified  Current Risk: low Medications Klonapin (Dosage: '1mg'$ )  Lexapro (Dosage: '30mg'$ )  xyprexa (Dosage: 2.'5mg'$ /day)  Objectives Related Problem: Stabilize anxiety level while increasing ability to function on a daily basis. Description: Learn and implement problem-solving strategies for realistically addressing worries. Target Date: 2021-09-22 Frequency:  Daily Modality: individual Progress: 50%  Related Problem: Stabilize anxiety level while increasing ability to function on a daily basis. Description: Learn and implement calming skills to reduce overall anxiety and manage anxiety symptoms. Target Date: 2021-09-22 Frequency: Daily Modality: individual Progress: 40%  Related Problem: Stabilize anxiety level while increasing ability to function on a daily basis. Description: Describe situations, thoughts, feelings, and actions associated with anxieties and worries, their impact on functioning, and attempts to resolve them. Target Date: 2021-09-22 Frequency: Daily Modality: individual Progress: 50%  Client Response full compliance  Service Location Location, [address] [place], [place] [address]  Service Code cpt [phone]  Normalize/Reframe  Facilitate problem solving  Identified an insight  Identify automatic thoughts  Integration of affect  Distress tolerance skill  Self care activities  Journaling  Self-monitoring  Session Notes: Dx.: Depression, Anxiety and Eating Disorder Meds: Neurontin, Klonapin, Paxil, Zophran (nausea), Magnesium, Lybalbi, Praosin.  Goals: Patient reports extreme levels of debilitating anxiety. She is seeking to reduce symptoms that interfere with day to day functioning. She wants to resolve her chronic fears of death and being abandoned. Goal date is 09-2020. Patient is having numerous medical symptoms and has episodes of fear that she is going to die. Revised goal date is 2-23. In addition, she is experiencing complicated grief related to the death of her mother. She reports feeling "disconnected" from her surroundings. Goal mostly achieved 09-2020. Will continue to address this issue with additional sessions. Goal 8-23.  Patient agrees to a video (Webex) session due to the pandemic. She is at home and I am in my home office.  [name] went to the GI doc and was told she has Gastritis. Has not gotten any  relief. She was given morning (Pantoprazole) and evening (Famotidine) meds along with Miralax. [name] she is "  sick to her stomach" at all times. She is no longer abusing laxatives. Her dissociation comes and goes. It is less than it was, but thinks it will never go away. She went to see [name] for the first time in 10 days and he has deteriorated significantly. He has been urinating and defecating on himself. She struggles with how to be involved, if at all. This has been sad for her, but she is glad they reconnected, even though brief. Suggested that she reconsider what relationship to have with him.                                           [name], PhD Time: 5:69A-3:70K 52mnutes

## 2021-07-07 NOTE — Progress Notes (Signed)
Reviewed and agree with documentation and assessment and plan. K. Veena Tanazia Achee , MD   

## 2021-07-08 ENCOUNTER — Ambulatory Visit (INDEPENDENT_AMBULATORY_CARE_PROVIDER_SITE_OTHER): Payer: Medicare Other | Admitting: Physician Assistant

## 2021-07-08 ENCOUNTER — Ambulatory Visit (INDEPENDENT_AMBULATORY_CARE_PROVIDER_SITE_OTHER): Payer: Medicare Other | Admitting: Psychology

## 2021-07-08 ENCOUNTER — Encounter: Payer: Self-pay | Admitting: Physician Assistant

## 2021-07-08 VITALS — BP 116/70 | HR 64 | Ht 63.0 in | Wt 135.0 lb

## 2021-07-08 DIAGNOSIS — R110 Nausea: Secondary | ICD-10-CM | POA: Diagnosis not present

## 2021-07-08 DIAGNOSIS — F4322 Adjustment disorder with anxiety: Secondary | ICD-10-CM

## 2021-07-08 DIAGNOSIS — K219 Gastro-esophageal reflux disease without esophagitis: Secondary | ICD-10-CM

## 2021-07-08 DIAGNOSIS — K5909 Other constipation: Secondary | ICD-10-CM

## 2021-07-08 DIAGNOSIS — R1013 Epigastric pain: Secondary | ICD-10-CM

## 2021-07-08 DIAGNOSIS — F502 Bulimia nervosa: Secondary | ICD-10-CM

## 2021-07-08 DIAGNOSIS — G8929 Other chronic pain: Secondary | ICD-10-CM

## 2021-07-08 MED ORDER — NA SULFATE-K SULFATE-MG SULF 17.5-3.13-1.6 GM/177ML PO SOLN: 1.0000 | Freq: Once | ORAL | 0 refills | Status: AC

## 2021-07-08 MED ORDER — FAMOTIDINE 40 MG PO TABS: 40.0000 mg | ORAL_TABLET | Freq: Two times a day (BID) | ORAL | 5 refills | Status: DC

## 2021-07-08 MED ORDER — PANTOPRAZOLE SODIUM 40 MG PO TBEC: 40.0000 mg | DELAYED_RELEASE_TABLET | Freq: Two times a day (BID) | ORAL | 5 refills | Status: AC

## 2021-07-08 NOTE — Progress Notes (Signed)
Reviewed and agree with documentation and assessment and plan. K. Veena Vani Gunner , MD   

## 2021-07-08 NOTE — Progress Notes (Signed)
Chief Complaint: Follow-up GERD and constipation  HPI:    [name] is a 66 year old female with a past medical history as listed below including bulimia nervosa, generalized anxiety disorder and others, known to Dr. Silverio Decamp, who returns to clinic today for follow-up of her constipation and reflux.    07/24/2018 Cologuard negative    11/19/2020 office visit with Dr. Silverio Decamp for abdominal pain, nausea and constipation.  At that time symptoms thought mostly related to eating disorder.  Recommended small frequent meals and Carafate 1 g 3 times daily.  Also added Pepcid 20 mg daily as needed.  Recent upper GI series with no sufficient pathology.  Nausea felt multifactorial secondary to eating disorder.  At that time patient advised to stop excessive amount of Ex-Lax often 50 tablets every other day.  Started on MiraLAX once daily titrated up as needed to have 1-2 soft bowel once daily.    03/18/2021 CBC normal.    06/02/2021 patient saw me and described a typical when she woke up in the morning she would have excruciating burning epigastric pain and feels lightheaded and then developed lower abdominal cramps and feel like she "6 months pregnant".  She battles with constipation oftentimes had to do manual disimpaction.  She used to take Ex-Lax 50 tablets every other day with her history of bulimia nervosa.  She inconsistently used her Pantoprazole in the morning and Famotidine at night.  At that time discussed using MiraLAX twice a day every morning and nightly and titrated up to 4 times a day for regular bowel movements and try to be more consistent with her Pantoprazole 40 mg in the morning and 20 mg at night.  Discussed that if she did the above and resolving problems then we will discuss need EGD and colonoscopy.    Today, the patient tells me that her symptoms are worse even though she is being very diligent about taking her medicine.  Currently using Pantoprazole 40 mg in the morning and Pepcid 40 mg  before bedtime.  Regardless is wakes up with nausea most of the time and a burning in her epigastrium which gets worse as she gets more active throughout the day.  Sometimes she also experiences lower abdominal pain.  Tells me that she is now radiating back and forth from constipation to diarrhea.  She is currently using her MiraLAX once a day because twice a day gave her urgent liquid stools at times.  Once a day does not seem quite strong enough for her so she is going to try and go back to twice a day again.  Tells me she is having daily bowel movements now.    Denies fever, chills, weight loss or blood in her stool.  Past Medical History:  Diagnosis Date   Acute pain of right knee 03/07/2017   Anemia    Anorexia    Anxiety    Bitemporal hemianopsia    Borderline personality disorder ([place])    Bulimia    Depression    Hyperlipidemia    Ocular migraine    Osteoporosis    Pigmentary retinal dystrophy    Retinitis pigmentosa    TBI (traumatic brain injury) ([place])     Past Surgical History:  Procedure Laterality Date   CESAREAN SECTION      Current Outpatient Medications  Medication Sig Dispense Refill   clonazePAM (KLONOPIN) 0.5 MG tablet Take 0.5-1 mg by mouth daily.     famotidine (PEPCID) 40 MG tablet Take 1 tablet by  mouth as needed.     OLANZapine (ZYPREXA) 2.5 MG tablet Take 1 tablet by mouth every 14 (fourteen) days.     pantoprazole (PROTONIX) 40 MG tablet Take 1 tablet by mouth as needed.     PARoxetine (PAXIL) 20 MG tablet Take 30 mg by mouth daily.     polyethylene glycol (MIRALAX / GLYCOLAX) 17 g packet as needed.     Probiotic Product (PROBIOTIC PO) Take 1 capsule by mouth as needed.     sucralfate (CARAFATE) 1 GM/10ML suspension Take by mouth.     No current facility-administered medications for this visit.    Allergies as of 07/08/2021 - Review Complete 07/08/2021  Allergen Reaction Noted   Carbamazepine Palpitations 03/13/2014   Prochlorperazine edisylate   08/18/2011   Keflex [cephalexin] Rash and Other (See Comments) 07/16/2014   Augmentin [amoxicillin-pot clavulanate] Diarrhea    Compazine [prochlorperazine]  11/19/2020   Epinephrine Palpitations 08/18/2011    Family History  Problem Relation Age of Onset   Cancer Father    Heart disease Father        CHF   Diabetes Sister        Obesity   Polymyalgia rheumatica Mother    Osteoporosis Mother     Social History   Socioeconomic History   Marital status: Divorced    Spouse name: Not on file   Number of children: 1   Years of education: Not on file   Highest education level: Not on file  Occupational History   Occupation: Hotel manager education    Employer: [employer]   Occupation: spin Art therapist   Occupation: Contractor   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  Tobacco Use   Smoking status: Never   Smokeless tobacco: Never  Vaping Use   Vaping Use: Never used  Substance and Sexual Activity   Alcohol use: No   Drug use: No   Sexual activity: Not on file  Other Topics Concern   Not on file  Social History Narrative   ** Merged History Encounter **       Social Determinants of Health   Financial Resource Strain: Not on file  Food Insecurity: Not on file  Transportation Needs: Not on file  Physical Activity: Not on file  Stress: Not on file  Social Connections: Not on file  Intimate Partner Violence: Not on file    Review of Systems:    Constitutional: No weight loss, fever or chills Cardiovascular: No chest pain Respiratory: No SOB  Gastrointestinal: See HPI and otherwise negative   Physical Exam:  Vital signs: BP 116/70   Pulse 64   Ht '5\' 3"'$  (1.6 m)   Wt 135 lb (61.2 kg)   LMP 02/18/2004   SpO2 97%   BMI 23.91 kg/m    Constitutional:   Pleasant  Elderly Caucasian female appears to be in NAD, Well developed, Well nourished, alert and cooperative Respiratory: Respirations even and unlabored. Lungs  clear to auscultation bilaterally.   No wheezes, crackles, or rhonchi.  Cardiovascular: Normal S1, S2. No MRG. Regular rate and rhythm. No peripheral edema, cyanosis or pallor.  Gastrointestinal:  Soft, nondistended, nontender. No rebound or guarding. Normal bowel sounds. No appreciable masses or hepatomegaly. Rectal:  Not performed. Psychiatric: Oriented to person, place and time. Demonstrates good judgement and reason without abnormal affect or behaviors.  No recent labs or imaging.  Assessment: 1.  Chronic constipation: History of bulimia and using 50 Ex-Lax a day for  purging, now seeming to alternate between diarrhea and constipation with the use of MiraLAX; likely IBS 2.  GERD: Continues with epigastric burning and reflux symptoms regardless of using her Pantoprazole 40 mg every morning and Pepcid 40 mg nightly; likely ongoing gastritis +/- PUD +/- H. pylori 3.  History of bulimia nervosa  Plan: 1.  Scheduled patient for a screening colonoscopy and a diagnostic EGD in the [place] with Dr. Silverio Decamp.  She wished to be scheduled "out of ways", I think to mentally prepare herself that she was somewhat anxious about having these things done.  Did discuss risks, benefits, limitations and alternatives and the patient agrees to proceed. Patient is appropriate for endoscopic procedure(s) in the ambulatory ([facility]) setting.  2.  Recommend the patient increase her MiraLAX to may be 1-1/2 doses a day or alternate between 1 dose and 2 doses every other day. 3.  Increased Pantoprazole to 40 mg twice a day, 30-60 minutes before breakfast and dinner.  Prescribed #60 with 5 refills. 4.  Suggest the patient try above for 3 to 4 days, if no benefit then also add Pepcid 40 mg in the morning in addition to her nightly dose.  Prescribed Pepcid 40 mg twice a day, every morning and nightly #60 with 5 refills. 5.  Encouraged patient to let me know how she is doing over the next couple of weeks, if she is really feeling no  better then would add Carafate 1 g up to 4 times daily, 1 hour before and 2 hours after other medications. 6.  Patient to follow in clinic per recommendations after time of procedures as above.  Ellouise Newer, PA-C [facility] 07/08/2021, 9:11 AM  Cc: Harrison Mons, PA

## 2021-07-08 NOTE — Progress Notes (Signed)
Rashel Okeefe is a 66 y.o. female patient   Date: 07/08/2021  Treatment Plan Diagnosis F43.21 (Adjustment Disorder, With depressed mood) [n/a]  F41.1 (Generalized anxiety disorder) [n/a]  F50.9 (Unspecified feeding or eating disorder) [n/a]  M35.36 (Uncomplicated bereavement) [n/a]  Symptoms Diminished interest in or enjoyment of activities. (Status: maintained) -- No Description Entered  Excessive and/or unrealistic worry that is difficult to control occurring more days than not for at least 6 months about a number of events or activities. (Status: maintained) -- No Description Entered  Intense fear of gaining weight or becoming fat, even though underweight. (Status: maintained) -- No Description Entered  Lack of energy. (Status: maintained) -- No Description Entered  Persistent preoccupation with body image related to grossly inaccurate assessment of self as overweight. (Status: maintained) -- No Description Entered  Recurrent inappropriate compensatory behaviors in order to prevent weight gain, such as self-induced vomiting; misuse of laxatives, diuretics, enemas, or other medications; fasting; or excessive exercise. (Status: maintained) -- No Description Entered  Thoughts dominated by loss coupled with poor concentration, tearful spells, and confusion about the future. (Status: maintained) -- No Description Entered  Unresolved grief issues. (Status: maintained) -- No Description Entered  Medication Status compliance  Safety none  If Suicidal or Homicidal State Action Taken: unspecified  Current Risk: low Medications Klonapin (Dosage: '1mg'$ )  Lexapro (Dosage: '30mg'$ )  xyprexa (Dosage: 2.'5mg'$ /day)  Objectives Related Problem: Stabilize anxiety level while increasing ability to function on a daily basis. Description: Learn and implement problem-solving strategies for realistically addressing worries. Target Date:  2022-01-22 Frequency: Daily Modality: individual Progress: 70%  Related Problem: Stabilize anxiety level while increasing ability to function on a daily basis. Description: Learn and implement calming skills to reduce overall anxiety and manage anxiety symptoms. Target Date: 2022-01-22 Frequency: Daily Modality: individual Progress: 50%  Related Problem: Stabilize anxiety level while increasing ability to function on a daily basis. Description: Describe situations, thoughts, feelings, and actions associated with anxieties and worries, their impact on functioning, and attempts to resolve them. Target Date: 2022-01-22 Frequency: Daily Modality: individual Progress: 50%  Client Response full compliance  Service Location Location, [address] [place], [place] [address]  Service Code cpt [phone]  Normalize/Reframe  Facilitate problem solving  Identified an insight  Identify automatic thoughts  Integration of affect  Distress tolerance skill  Self care activities  Journaling  Self-monitoring  Session Notes: Dx.: Depression, Anxiety and Eating Disorder Meds: Neurontin, Klonapin, Paxil, Zophran (nausea), Magnesium, Lybalbi, Praosin.  Goals: Patient reports extreme levels of debilitating anxiety. She is seeking to reduce symptoms that interfere with day to day functioning. She wants to resolve her chronic fears of death and being abandoned. Goal date is 09-2020. Patient is having numerous medical symptoms and has episodes of fear that she is going to die. Revised goal date is 12-23. In addition, she is experiencing complicated grief related to the death of her mother. She reports feeling "disconnected" from her surroundings. Goal mostly achieved 09-2020. Will continue to address this issue with additional sessions. Goal 12-23.  Patient agrees to a video (Webex) session due to the pandemic. She is at home and I am in my home office.  [name] will have follow up with her GI today. She  will tell her that the medicine she  prescribed is not working. Today is the anniversary of her sister's passing and yesterday is the birthday of her deceased brother. Sister was severely obese with diabetes and would not take meds for the diabetes. She was 49. Brother was 16 when he died. [name] feels guilty about not treating her sister well. She related the history of the family dynamics and the difficulties throughout the years. She also discussed the guilt she feels with regard to [name] and his current condition. We discussed whether it is in her best interest to visit him. I suggested she give it a try and see what the experience tells her. She agrees.                                            [name], PhD Time: 9:79F-3:69Q 35mnutes

## 2021-07-08 NOTE — Patient Instructions (Signed)
If you are age 68 or older, your body mass index should be between 23-30. Your Body mass index is 23.91 kg/m. If this is out of the aforementioned range listed, please consider follow up with your Primary Care Provider.  If you are age 60 or younger, your body mass index should be between 19-25. Your Body mass index is 23.91 kg/m. If this is out of the aformentioned range listed, please consider follow up with your Primary Care Provider.   You have been scheduled for an endoscopy and colonoscopy. Please follow the written instructions given to you at your visit today. Please pick up your prep supplies at the pharmacy within the next 1-3 days. If you use inhalers (even only as needed), please bring them with you on the day of your procedure.   Please let us know how you are doing in 2 weeks.  The [facility] providers would like to encourage you to use [facility] to communicate with providers for non-urgent requests or questions.  Due to long hold times on the telephone, sending your provider a message by [facility] may be a faster and more efficient way to get a response.  Please allow 48 business hours for a response.  Please remember that this is for non-urgent requests.   It was a pleasure to see you today!  Thank you for trusting me with your gastrointestinal care!    Scott E.Candis Schatz, MD

## 2021-07-15 ENCOUNTER — Ambulatory Visit: Payer: Medicare Other | Admitting: Psychology

## 2021-07-22 ENCOUNTER — Ambulatory Visit: Payer: Medicare Other | Admitting: Psychology

## 2021-07-29 ENCOUNTER — Ambulatory Visit (INDEPENDENT_AMBULATORY_CARE_PROVIDER_SITE_OTHER): Payer: Medicare Other | Admitting: Psychology

## 2021-07-29 DIAGNOSIS — F4322 Adjustment disorder with anxiety: Secondary | ICD-10-CM

## 2021-07-29 NOTE — Progress Notes (Signed)
[name] is a 66 y.o. female patient   Date: 07/29/2021  Treatment Plan Diagnosis F43.21 (Adjustment Disorder, With depressed mood) [n/a]  F41.1 (Generalized anxiety disorder) [n/a]  F50.9 (Unspecified feeding or eating disorder) [n/a]  Z66.29 (Uncomplicated bereavement) [n/a]  Symptoms Diminished interest in or enjoyment of activities. (Status: maintained) -- No Description Entered  Excessive and/or unrealistic worry that is difficult to control occurring more days than not for at least 6 months about a number of events or activities. (Status: maintained) -- No Description Entered  Intense fear of gaining weight or becoming fat, even though underweight. (Status: maintained) -- No Description Entered  Lack of energy. (Status: maintained) -- No Description Entered  Persistent preoccupation with body image related to grossly inaccurate assessment of self as overweight. (Status: maintained) -- No Description Entered  Recurrent inappropriate compensatory behaviors in order to prevent weight gain, such as self-induced vomiting; misuse of laxatives, diuretics, enemas, or other medications; fasting; or excessive exercise. (Status: maintained) -- No Description Entered  Thoughts dominated by loss coupled with poor concentration, tearful spells, and confusion about the future. (Status: maintained) -- No Description Entered  Unresolved grief issues. (Status: maintained) -- No Description Entered  Medication Status compliance  Safety none  If Suicidal or Homicidal State Action Taken: unspecified  Current Risk: low Medications Klonapin (Dosage: '1mg'$ )  Lexapro (Dosage: '30mg'$ )  xyprexa (Dosage: 2.'5mg'$ /day)  Objectives Related Problem: Stabilize anxiety level while increasing ability to function on a daily basis. Description: Learn and implement problem-solving strategies for realistically  addressing worries. Target Date: 2022-01-22 Frequency: Daily Modality: individual Progress: 70%  Related Problem: Stabilize anxiety level while increasing ability to function on a daily basis. Description: Learn and implement calming skills to reduce overall anxiety and manage anxiety symptoms. Target Date: 2022-01-22 Frequency: Daily Modality: individual Progress: 50%  Related Problem: Stabilize anxiety level while increasing ability to function on a daily basis. Description: Describe situations, thoughts, feelings, and actions associated with anxieties and worries, their impact on functioning, and attempts to resolve them. Target Date: 2022-01-22 Frequency: Daily Modality: individual Progress: 50%  Client Response full compliance  Service Location Location, [address] [place], [place] [address]  Service Code cpt [phone]  Normalize/Reframe  Facilitate problem solving  Identified an insight  Identify automatic thoughts  Integration of affect  Distress tolerance skill  Self care activities  Journaling  Self-monitoring  Session Notes: Dx.: Depression, Anxiety and Eating Disorder Meds: Neurontin, Klonapin, Paxil, Zophran (nausea), Magnesium, Lybalbi, Praosin.  Goals: Patient reports extreme levels of debilitating anxiety. She is seeking to reduce symptoms that interfere with day to day functioning. She wants to resolve her chronic fears of death and being abandoned. Goal date is 09-2020. Patient is having numerous medical symptoms and has episodes of fear that she is going to die. Revised goal date is 12-23. In addition, she is experiencing complicated grief related to the death of her mother. She reports feeling "disconnected" from her surroundings. Goal mostly achieved 09-2020. Will continue to address this issue with additional sessions. Goal 12-23.  Patient agrees to a video (Webex) session due to the pandemic. She is at home and I am in my home office.  [name] says she  misses the energy she used to have "when she was skinny". She discussed feeling anxious every morning as she has been for the past year. It does, however, dissipate by early afternoon. She is visiting [name] every other day and he is in terrible shape. She says that his daughter just wants him to die. It feels important for [name] to go visit and care for [name]. It is difficult, but does not contribute to any of her emotional difficulties. Perhaps satisfies a need to be a caretaker Eating disorder more stable in past couple of weeks.                                               Janice Morel, PhD Time: 3:88I-7:57V 78mnutes

## 2021-08-05 ENCOUNTER — Ambulatory Visit (INDEPENDENT_AMBULATORY_CARE_PROVIDER_SITE_OTHER): Payer: Medicare Other | Admitting: Psychology

## 2021-08-05 DIAGNOSIS — F4322 Adjustment disorder with anxiety: Secondary | ICD-10-CM

## 2021-08-05 NOTE — Progress Notes (Signed)
[name] is a 66 y.o. female patient   Date: 08/05/2021  Treatment Plan Diagnosis F43.21 (Adjustment Disorder, With depressed mood) [n/a]  F41.1 (Generalized anxiety disorder) [n/a]  F50.9 (Unspecified feeding or eating disorder) [n/a]  [id number] (Uncomplicated bereavement) [n/a]  Symptoms Diminished interest in or enjoyment of activities. (Status: maintained) -- No Description Entered  Excessive and/or unrealistic worry that is difficult to control occurring more days than not for at least 6 months about a number of events or activities. (Status: maintained) -- No Description Entered  Intense fear of gaining weight or becoming fat, even though underweight. (Status: maintained) -- No Description Entered  Lack of energy. (Status: maintained) -- No Description Entered  Persistent preoccupation with body image related to grossly inaccurate assessment of self as overweight. (Status: maintained) -- No Description Entered  Recurrent inappropriate compensatory behaviors in order to prevent weight gain, such as self-induced vomiting; misuse of laxatives, diuretics, enemas, or other medications; fasting; or excessive exercise. (Status: maintained) -- No Description Entered  Thoughts dominated by loss coupled with poor concentration, tearful spells, and confusion about the future. (Status: maintained) -- No Description Entered  Unresolved grief issues. (Status: maintained) -- No Description Entered  Medication Status compliance  Safety none  If Suicidal or Homicidal State Action Taken: unspecified  Current Risk: low Medications Klonapin (Dosage: 50m)  Lexapro (Dosage: 369m  xyprexa (Dosage: 2.69m52may)  Objectives Related Problem: Stabilize anxiety level while increasing ability to function on a daily basis. Description: Learn and implement problem-solving strategies for realistically addressing worries. Target Date:  2022-01-22 Frequency: Daily Modality: individual Progress: 70%  Related Problem: Stabilize anxiety level while increasing ability to function on a daily basis. Description: Learn and implement calming skills to reduce overall anxiety and manage anxiety symptoms. Target Date: 2022-01-22 Frequency: Daily Modality: individual Progress: 50%  Related Problem: Stabilize anxiety level while increasing ability to function on a daily basis. Description: Describe situations, thoughts, feelings, and actions associated with anxieties and worries, their impact on functioning, and attempts to resolve them. Target Date: 2022-01-22 Frequency: Daily Modality: individual Progress: 50%  Client Response full compliance  Service Location Location, [address] [place] [id number] Code cpt [phone]/Reframe  Facilitate problem solving  Identified an insight  Identify automatic thoughts  Integration of affect  Distress tolerance skill  Self care activities  Journaling  Self-monitoring  Session Notes: Dx.: Depression, Anxiety and Eating Disorder Meds: Neurontin, Klonapin, Paxil, Zophran (nausea), Magnesium, Lybalbi, Praosin, Zyprexa 2.69mg94mGoals: Patient reports extreme levels of debilitating anxiety. She is seeking to reduce symptoms that interfere with day to day functioning. She wants to resolve her chronic fears of death and being abandoned. Goal date is 09-2020. Patient is having numerous medical symptoms and has episodes of fear that she is going to die. Revised goal date is 12-23. In addition, she is experiencing complicated grief related to the death of her mother. She reports feeling "disconnected" from her surroundings. Goal mostly achieved 09-2020. Will continue to address this issue with additional sessions. Goal 12-23.  Patient agrees to a video ([employer]. She is at home and I am in my home office.  [name] that this week of anxiety has been much  worse. She is thinking it may be related to the stress over the situation with [name] afternoons were  previously much better, but now she is having anxiety late in the day. I suggested that she reconsider her visits with [name] as it is appearing to contribute to her feeling disconnected. She is planning to go to [place] on Sunday for a week and is worried if she can tolerate the trip. Also suggested that she revisit her medication and dosages with her doctor. Has not binged, thrown up or taken laxatives in several weeks. She states she is not sure she can tolerate not going to see [name]. She says she is obsessed with going and is not sure why. We talked about her tendency toward anticipatory anxiety and ways to offset this tendency.                                                Janice Morel, PhD Time: 10:40a-11:30a 82mnutes

## 2021-08-12 ENCOUNTER — Ambulatory Visit: Payer: Medicare Other | Admitting: Psychology

## 2021-08-17 ENCOUNTER — Ambulatory Visit (INDEPENDENT_AMBULATORY_CARE_PROVIDER_SITE_OTHER): Payer: Medicare Other | Admitting: Psychology

## 2021-08-17 DIAGNOSIS — F4322 Adjustment disorder with anxiety: Secondary | ICD-10-CM | POA: Diagnosis not present

## 2021-08-17 NOTE — Progress Notes (Signed)
[name] is a 66 y.o. female patient   Date: 08/17/2021  Treatment Plan Diagnosis F43.21 (Adjustment Disorder, With depressed mood) [n/a]  F41.1 (Generalized anxiety disorder) [n/a]  F50.9 (Unspecified feeding or eating disorder) [n/a]  H29.92 (Uncomplicated bereavement) [n/a]  Symptoms Diminished interest in or enjoyment of activities. (Status: maintained) -- No Description Entered  Excessive and/or unrealistic worry that is difficult to control occurring more days than not for at least 6 months about a number of events or activities. (Status: maintained) -- No Description Entered  Intense fear of gaining weight or becoming fat, even though underweight. (Status: maintained) -- No Description Entered  Lack of energy. (Status: maintained) -- No Description Entered  Persistent preoccupation with body image related to grossly inaccurate assessment of self as overweight. (Status: maintained) -- No Description Entered  Recurrent inappropriate compensatory behaviors in order to prevent weight gain, such as self-induced vomiting; misuse of laxatives, diuretics, enemas, or other medications; fasting; or excessive exercise. (Status: maintained) -- No Description Entered  Thoughts dominated by loss coupled with poor concentration, tearful spells, and confusion about the future. (Status: maintained) -- No Description Entered  Unresolved grief issues. (Status: maintained) -- No Description Entered  Medication Status compliance  Safety none  If Suicidal or Homicidal State Action Taken: unspecified  Current Risk: low Medications Klonapin (Dosage: '1mg'$ )  Lexapro (Dosage: '30mg'$ )  xyprexa (Dosage: 2.'5mg'$ /day)  Objectives Related Problem: Stabilize anxiety level while increasing ability to function on a daily basis. Description: Learn and implement problem-solving strategies for realistically addressing  worries. Target Date: 2022-01-22 Frequency: Daily Modality: individual Progress: 70%  Related Problem: Stabilize anxiety level while increasing ability to function on a daily basis. Description: Learn and implement calming skills to reduce overall anxiety and manage anxiety symptoms. Target Date: 2022-01-22 Frequency: Daily Modality: individual Progress: 50%  Related Problem: Stabilize anxiety level while increasing ability to function on a daily basis. Description: Describe situations, thoughts, feelings, and actions associated with anxieties and worries, their impact on functioning, and attempts to resolve them. Target Date: 2022-01-22 Frequency: Daily Modality: individual Progress: 50%  Client Response full compliance  Service Location Location, [address] [place], [place] [address]  Service Code cpt [phone]  Normalize/Reframe  Facilitate problem solving  Identified an insight  Identify automatic thoughts  Integration of affect  Distress tolerance skill  Self care activities  Journaling  Self-monitoring  Session Notes: Dx.: Depression, Anxiety and Eating Disorder Meds: Neurontin, Klonapin, Paxil, Zophran (nausea), Magnesium, Lybalbi, Praosin, Zyprexa 2.'5mg'$ .  Goals: Patient reports extreme levels of debilitating anxiety. She is seeking to reduce symptoms that interfere with day to day functioning. She wants to resolve her chronic fears of death and being abandoned. Goal date is 09-2020. Patient is having numerous medical symptoms and has episodes of fear that she is going to die. Revised goal date is 12-23. In addition, she is experiencing complicated grief related to the death of her mother. She reports feeling "disconnected" from her surroundings. Goal mostly achieved 09-2020. Will continue to address this issue with additional sessions. Goal 12-23.  Patient agrees to a video [employer]) session. She is at home and I am in my home office.  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 [name] is going to see [name] every couple of days. She is not sure if her visits are helpful to [place] or if her visits contributes to her anxiety. States she is worried that her meds are not working and even contributing to her anxiety. I suggested she contact her doctor with her concerns. 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 Her depersonalization was sporadic in [place], and anxiety mostly controlled. She feels that control was mostly due to being so busy. We talked about the need for her to be more active and the benefit of seeking additional activities. She will explore options. Will also consider a move out of her apartment into a new place.                                                       [name], PhD Time: 9:60A-5:40J 65mnutes

## 2021-08-19 ENCOUNTER — Ambulatory Visit: Payer: Medicare Other | Admitting: Psychology

## 2021-08-26 ENCOUNTER — Ambulatory Visit (INDEPENDENT_AMBULATORY_CARE_PROVIDER_SITE_OTHER): Payer: Medicare Other | Admitting: Psychology

## 2021-08-26 DIAGNOSIS — F4322 Adjustment disorder with anxiety: Secondary | ICD-10-CM | POA: Diagnosis not present

## 2021-08-26 NOTE — Progress Notes (Signed)
[name] is a 66 y.o. female patient   Date: 08/26/2021  Treatment Plan Diagnosis F43.21 (Adjustment Disorder, With depressed mood) [n/a]  F41.1 (Generalized anxiety disorder) [n/a]  F50.9 (Unspecified feeding or eating disorder) [n/a]  P50.93 (Uncomplicated bereavement) [n/a]  Symptoms Diminished interest in or enjoyment of activities. (Status: maintained) -- No Description Entered  Excessive and/or unrealistic worry that is difficult to control occurring more days than not for at least 6 months about a number of events or activities. (Status: maintained) -- No Description Entered  Intense fear of gaining weight or becoming fat, even though underweight. (Status: maintained) -- No Description Entered  Lack of energy. (Status: maintained) -- No Description Entered  Persistent preoccupation with body image related to grossly inaccurate assessment of self as overweight. (Status: maintained) -- No Description Entered  Recurrent inappropriate compensatory behaviors in order to prevent weight gain, such as self-induced vomiting; misuse of laxatives, diuretics, enemas, or other medications; fasting; or excessive exercise. (Status: maintained) -- No Description Entered  Thoughts dominated by loss coupled with poor concentration, tearful spells, and confusion about the future. (Status: maintained) -- No Description Entered  Unresolved grief issues. (Status: maintained) -- No Description Entered  Medication Status compliance  Safety none  If Suicidal or Homicidal State Action Taken: unspecified  Current Risk: low Medications Klonapin (Dosage: '1mg'$ )  Lexapro (Dosage: '30mg'$ )  xyprexa (Dosage: 2.'5mg'$ /day)  Objectives Related Problem: Stabilize anxiety level while increasing ability to function on a daily basis. Description: Learn and implement problem-solving strategies for  realistically addressing worries. Target Date: 2022-01-22 Frequency: Daily Modality: individual Progress: 70%  Related Problem: Stabilize anxiety level while increasing ability to function on a daily basis. Description: Learn and implement calming skills to reduce overall anxiety and manage anxiety symptoms. Target Date: 2022-01-22 Frequency: Daily Modality: individual Progress: 50%  Related Problem: Stabilize anxiety level while increasing ability to function on a daily basis. Description: Describe situations, thoughts, feelings, and actions associated with anxieties and worries, their impact on functioning, and attempts to resolve them. Target Date: 2022-01-22 Frequency: Daily Modality: individual Progress: 50%  Client Response full compliance  Service Location Location, [address] [place], [place] [address]  Service Code cpt [phone]  Normalize/Reframe  Facilitate problem solving  Identified an insight  Identify automatic thoughts  Integration of affect  Distress tolerance skill  Self care activities  Journaling  Self-monitoring  Session Notes: Dx.: Depression, Anxiety and Eating Disorder Meds: Neurontin, Klonapin, Paxil, Zophran (nausea), Magnesium, Lybalbi, Praosin, Zyprexa 2.'5mg'$ .  Goals: Patient reports extreme levels of debilitating anxiety. She is seeking to reduce symptoms that interfere with day to day functioning. She wants to resolve her chronic fears of death and being abandoned. Goal date is 09-2020. Patient is having numerous medical symptoms and has episodes of fear that she is going to die. Revised goal date is 12-23. In addition, she is experiencing complicated grief related to the death of her mother. She reports feeling "disconnected" from her surroundings. Goal mostly achieved 09-2020. Will continue to address this issue with additional sessions. Goal 12-23.  Patient agrees to a video [employer]) session. She is at home and I am in my home office.  [name]  had a dream where everyone in her family rejected her. She had done something to alienate everyone resulting in them rejecting her. She had been taking with [name] about her difficult relationship with her son and [name] talked about her estrangement from her Bradshaw [name]. It is likely the reason she had this dream. [name] is mostly feeling alone and disconnected. She says she is still working on coming to terms with Janice condition and that he will never be the person he once was.  She is struggling to work through the complicated relationship with her mother. Says she wishes her mother could "come back" so she could reconcile their differences. Carries guilt and feels she needs to apologize. She says "I was mad at my mother for getting old" and "I was mean to her". Says her emotional maturity stopped at age 56. Did not realize until age 37 that she needed to individuate. Encouraged her to document/journal these feelings.                                                          [name], PhD Time: 1:77N-1:65B 32mnutes

## 2021-09-02 ENCOUNTER — Ambulatory Visit (INDEPENDENT_AMBULATORY_CARE_PROVIDER_SITE_OTHER): Payer: Medicare Other | Admitting: Psychology

## 2021-09-02 DIAGNOSIS — F4322 Adjustment disorder with anxiety: Secondary | ICD-10-CM | POA: Diagnosis not present

## 2021-09-02 NOTE — Progress Notes (Signed)
[name] is a 66 y.o. female patient   Date: 09/02/2021  Treatment Plan Diagnosis F43.21 (Adjustment Disorder, With depressed mood) [n/a]  F41.1 (Generalized anxiety disorder) [n/a]  F50.9 (Unspecified feeding or eating disorder) [n/a]  M38.46 (Uncomplicated bereavement) [n/a]  Symptoms Diminished interest in or enjoyment of activities. (Status: maintained) -- No Description Entered  Excessive and/or unrealistic worry that is difficult to control occurring more days than not for at least 6 months about a number of events or activities. (Status: maintained) -- No Description Entered  Intense fear of gaining weight or becoming fat, even though underweight. (Status: maintained) -- No Description Entered  Lack of energy. (Status: maintained) -- No Description Entered  Persistent preoccupation with body image related to grossly inaccurate assessment of self as overweight. (Status: maintained) -- No Description Entered  Recurrent inappropriate compensatory behaviors in order to prevent weight gain, such as self-induced vomiting; misuse of laxatives, diuretics, enemas, or other medications; fasting; or excessive exercise. (Status: maintained) -- No Description Entered  Thoughts dominated by loss coupled with poor concentration, tearful spells, and confusion about the future. (Status: maintained) -- No Description Entered  Unresolved grief issues. (Status: maintained) -- No Description Entered  Medication Status compliance  Safety none  If Suicidal or Homicidal State Action Taken: unspecified  Current Risk: low Medications Klonapin (Dosage: '1mg'$ )  Lexapro (Dosage: '30mg'$ )  xyprexa (Dosage: 2.'5mg'$ /day)  Objectives Related Problem: Stabilize anxiety level while increasing ability to function on a daily basis. Description: Learn and implement  problem-solving strategies for realistically addressing worries. Target Date: 2022-01-22 Frequency: Daily Modality: individual Progress: 70%  Related Problem: Stabilize anxiety level while increasing ability to function on a daily basis. Description: Learn and implement calming skills to reduce overall anxiety and manage anxiety symptoms. Target Date: 2022-01-22 Frequency: Daily Modality: individual Progress: 50%  Related Problem: Stabilize anxiety level while increasing ability to function on a daily basis. Description: Describe situations, thoughts, feelings, and actions associated with anxieties and worries, their impact on functioning, and attempts to resolve them. Target Date: 2022-01-22 Frequency: Daily Modality: individual Progress: 50%  Client Response full compliance  Service Location Location, [address] [place], [place] [address]  Service Code cpt [phone]  Normalize/Reframe  Facilitate problem solving  Identified an insight  Identify automatic thoughts  Integration of affect  Distress tolerance skill  Self care activities  Journaling  Self-monitoring  Session Notes: Dx.: Depression, Anxiety and Eating Disorder Meds: Neurontin, Klonapin, Paxil, Zophran (nausea), Magnesium, Lybalbi, Praosin, Zyprexa 2.'5mg'$ .  Goals: Patient reports extreme levels of debilitating anxiety. She is seeking to reduce symptoms that interfere with day to day functioning. She wants to resolve her chronic fears of death and being abandoned. Goal date is 09-2020. Patient is having numerous medical symptoms and has episodes of fear that she is going to die. Revised goal date is 12-23. In addition, she is experiencing complicated grief related to the death of her mother. She reports feeling "disconnected" from her surroundings. Goal mostly achieved 09-2020. Will continue to address this issue with additional sessions. Goal 12-23.  Patient agrees to a  video ([employer]) session. She is at home and I  am in my home office.  [name] states that she has been getting up at 5am to go to the gym as a way to offset her early morning anxiety. Cannot yet tell if it is working. She talked about feeling humiliated after a visit with [name] when he rejected her. 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 His condition is worsening and [name] says "it is horrible". She is speculating about his behavior but it is mostly negative. She feels she has to go visit as she cannot tolerate the concept of abandoning him at this time.  She reports that in response to the stress of visiting [name], she binged and purged. She also abused laxatives this week. Says she has been back in control over the past 2 days. We talked about what her reaction will be when [name] passes. She anticipates it will be very difficult. She is feeling deflated about not getting any acknowledgement from [employer] family for her efforts. I shared that she needs to be in touch with her own motivations and to have realistic expectations.  She hopes to find a house to rent and be able to move by the Fall.                                                            [name], PhD Time: 0:78M-7:54G 76mnutes

## 2021-09-05 ENCOUNTER — Telehealth: Payer: Self-pay | Admitting: Gastroenterology

## 2021-09-05 NOTE — Telephone Encounter (Signed)
ok 

## 2021-09-05 NOTE — Telephone Encounter (Signed)
Good Morning Dr. Silverio Decamp,  Patient called stating she needed to reschedule her endo and colon procedures for 7/26 due to having a lot going on this week.  Patient was rescheduled for 8/30 at 2:30.

## 2021-09-07 ENCOUNTER — Encounter: Payer: Medicare Other | Admitting: Gastroenterology

## 2021-09-09 ENCOUNTER — Ambulatory Visit (INDEPENDENT_AMBULATORY_CARE_PROVIDER_SITE_OTHER): Payer: Medicare Other | Admitting: Psychology

## 2021-09-09 DIAGNOSIS — F411 Generalized anxiety disorder: Secondary | ICD-10-CM | POA: Diagnosis not present

## 2021-09-09 NOTE — Progress Notes (Signed)
[name] is a 66 y.o. female patient   Date: 09/09/2021  Treatment Plan Diagnosis F43.21 (Adjustment Disorder, With depressed mood) [n/a]  F41.1 (Generalized anxiety disorder) [n/a]  F50.9 (Unspecified feeding or eating disorder) [n/a]  C62.37 (Uncomplicated bereavement) [n/a]  Symptoms Diminished interest in or enjoyment of activities. (Status: maintained) -- No Description Entered  Excessive and/or unrealistic worry that is difficult to control occurring more days than not for at least 6 months about a number of events or activities. (Status: maintained) -- No Description Entered  Intense fear of gaining weight or becoming fat, even though underweight. (Status: maintained) -- No Description Entered  Lack of energy. (Status: maintained) -- No Description Entered  Persistent preoccupation with body image related to grossly inaccurate assessment of self as overweight. (Status: maintained) -- No Description Entered  Recurrent inappropriate compensatory behaviors in order to prevent weight gain, such as self-induced vomiting; misuse of laxatives, diuretics, enemas, or other medications; fasting; or excessive exercise. (Status: maintained) -- No Description Entered  Thoughts dominated by loss coupled with poor concentration, tearful spells, and confusion about the future. (Status: maintained) -- No Description Entered  Unresolved grief issues. (Status: maintained) -- No Description Entered  Medication Status compliance  Safety none  If Suicidal or Homicidal State Action Taken: unspecified  Current Risk: low Medications Klonapin (Dosage: '1mg'$ )  Lexapro (Dosage: '30mg'$ )  xyprexa (Dosage: 2.'5mg'$ /day)  Objectives Related Problem: Stabilize anxiety level while increasing ability to function on a daily  basis. Description: Learn and implement problem-solving strategies for realistically addressing worries. Target Date: 2022-01-22 Frequency: Daily Modality: individual Progress: 70%  Related Problem: Stabilize anxiety level while increasing ability to function on a daily basis. Description: Learn and implement calming skills to reduce overall anxiety and manage anxiety symptoms. Target Date: 2022-01-22 Frequency: Daily Modality: individual Progress: 50%  Related Problem: Stabilize anxiety level while increasing ability to function on a daily basis. Description: Describe situations, thoughts, feelings, and actions associated with anxieties and worries, their impact on functioning, and attempts to resolve them. Target Date: 2022-01-22 Frequency: Daily Modality: individual Progress: 50%  Client Response full compliance  Service Location Location, [address] [place], [place] [address]  Service Code cpt [phone]  Normalize/Reframe  Facilitate problem solving  Identified an insight  Identify automatic thoughts  Integration of affect  Distress tolerance skill  Self care activities  Journaling  Self-monitoring  Session Notes: Dx.: Depression, Anxiety and Eating Disorder Meds: Neurontin, Klonapin, Paxil, Zophran (nausea), Magnesium, Lybalbi, Praosin, Zyprexa 2.'5mg'$ .  Goals: Patient reports extreme levels of debilitating anxiety. She is seeking to reduce symptoms that interfere with day to day functioning. She wants to resolve her chronic fears of death and being abandoned. Goal date is 09-2020. Patient is having numerous medical symptoms and has episodes of fear that she is going to die. Revised goal date is 12-23. In addition, she is experiencing complicated grief related to the death of her mother. She reports feeling "disconnected" from her surroundings. Goal mostly achieved 09-2020. Will  continue to address this issue with additional sessions. Goal 12-23.  Patient agrees to a  video [employer]) session. She is at home and I am in my home office.  [name] states she is committed to move to a new apartment. She discussed the difficulty of visiting [name] and his worsening condition. This has been a difficult couple of weeks and she is back to binging and purging. She was scheduled for colonoscopy and endoscopy this week and cancelled because she fears the anesthesia. Her fear is related to her sense of not feeling real. States "I am afraid I will not wake up". I suggested that she consider inpatient eating disorders program. She needs specialized intensive treatment. I shared with her that our meetings have reduced her symptoms/behaviors but has not been successful at eliminating her active eating disorder. She feels [facility] treatment facility was the best and would consider. Told to call facility to find out if she is covered by her insurance. She will call and let me know. Will talk further about going to treatment and address her reluctance (being away from clients for a month).                                                                Janice Morel, PhD Time: 8:48T-9:27G 21mnutes

## 2021-09-16 ENCOUNTER — Ambulatory Visit (INDEPENDENT_AMBULATORY_CARE_PROVIDER_SITE_OTHER): Payer: Medicare Other | Admitting: Psychology

## 2021-09-16 DIAGNOSIS — F411 Generalized anxiety disorder: Secondary | ICD-10-CM | POA: Diagnosis not present

## 2021-09-16 NOTE — Progress Notes (Signed)
Polina Burmaster is a 66 y.o. 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 (Status: maintained) -- No Description Entered  Excessive and/or unrealistic worry that is difficult to control occurring more days than not for at least 6 months about a number of events or activities. (Status: maintained) -- No Description Entered  Intense fear of gaining weight or becoming fat, even though underweight. (Status: maintained) -- No Description Entered  Lack of energy. (Status: maintained) -- No Description Entered  Persistent preoccupation with body image related to grossly inaccurate assessment of self as overweight. (Status: maintained) -- No Description Entered  Recurrent inappropriate compensatory behaviors in order to prevent weight gain, such as self-induced vomiting; misuse of laxatives, diuretics, enemas, or other medications; fasting; or excessive exercise. (Status: maintained) -- No Description Entered  Thoughts dominated by loss coupled with poor concentration, tearful spells, and confusion about the future. (Status: maintained) -- No Description Entered  Unresolved grief issues. (Status: maintained) -- No Description Entered  Medication Status compliance  Safety none  If Suicidal or Homicidal State Action Taken: unspecified  Current Risk: low Medications Klonapin (Dosage: '1mg'$ )  Lexapro (Dosage: '30mg'$ )  xyprexa (Dosage: 2.'5mg'$ /day)  Objectives Related Problem: Stabilize anxiety level while increasing ability to function  on a daily basis. Description: Learn and implement problem-solving strategies for realistically addressing worries. Target Date: 2022-01-22 Frequency: Daily Modality: individual Progress: 70%  Related Problem: Stabilize anxiety level while increasing ability to function on a daily basis. Description: Learn and implement calming skills to reduce overall anxiety and manage anxiety symptoms. Target Date: 2022-01-22 Frequency: Daily Modality: individual Progress: 50%  Related Problem: Stabilize anxiety level while increasing ability to function on a daily basis. Description: Describe situations, thoughts, feelings, and actions associated with anxieties and worries, their impact on functioning, and attempts to resolve them. Target Date: 2022-01-22 Frequency: Daily Modality: individual Progress: 50%  Client Response full compliance  Service Location Location, [address] [place], [place] [address]  Service Code cpt [phone]  Normalize/Reframe  Facilitate problem solving  Identified an insight  Identify automatic thoughts  Integration of affect  Distress tolerance skill  Self care activities  Journaling  Self-monitoring  Session Notes: Dx.: Depression, Anxiety and Eating Disorder Meds: Neurontin, Klonapin, Paxil, Zophran (nausea), Magnesium, Lybalbi, Praosin, Zyprexa 2.'5mg'$ .  Goals: Patient reports extreme levels of debilitating anxiety. She is seeking to reduce symptoms that interfere with day to day functioning. She wants to resolve her chronic fears of death and being abandoned. Goal date is 09-2020. Patient is having numerous medical symptoms and has episodes of fear that she is going to die. Revised goal date is 12-23. In addition, she is experiencing complicated grief related to the death  of her mother. She reports feeling "disconnected" from her surroundings. Goal mostly achieved 09-2020. Will continue to address this issue with additional sessions. Goal 12-23.  Patient  agrees to a video [employer]) session. She is at home and I am in my home office.  [name] states she continues to feel extremely anxious in the morning. Wants to move from apartment but is concerned about the finances. Does not want to invade retirement money, but also does not want to ask brother for money. Will decide how to proceed. She had a very upsetting experience when visiting [name]. She was with [name]'s sister and the CNA took his sister aside and told her that [name]'s visits are upsetting to [place] and make him agitated. They also told his sister that [name]'s visits are too long. [name] feels bad and says she will "never go back". She is angry that they did not confront her directly. She says her eating has been good for a week and she has not binged or purged.She will decide about what to do regarding a potential mood and discuss further at the next session.                                                                  [name], PhD Time: 4:97N-3:00F 79mnutes

## 2021-09-23 ENCOUNTER — Ambulatory Visit: Payer: Medicare Other | Admitting: Psychology

## 2021-09-30 ENCOUNTER — Ambulatory Visit (INDEPENDENT_AMBULATORY_CARE_PROVIDER_SITE_OTHER): Payer: Medicare Other | Admitting: Psychology

## 2021-09-30 DIAGNOSIS — F411 Generalized anxiety disorder: Secondary | ICD-10-CM | POA: Diagnosis not present

## 2021-09-30 NOTE — Progress Notes (Signed)
[name] is a 66 y.o. female patient   Date: 09/30/2021  Treatment Plan Diagnosis F43.21 (Adjustment Disorder, With depressed mood) [n/a]  F41.1 (Generalized anxiety disorder) [n/a]  F50.9 (Unspecified feeding or eating disorder) [n/a]  I37.04 (Uncomplicated bereavement) [n/a]  Symptoms Diminished interest in or enjoyment of activities. (Status: maintained) -- No Description Entered  Excessive and/or unrealistic worry that is difficult to control occurring more days than not for at least 6 months about a number of events or activities. (Status: maintained) -- No Description Entered  Intense fear of gaining weight or becoming fat, even though underweight. (Status: maintained) -- No Description Entered  Lack of energy. (Status: maintained) -- No Description Entered  Persistent preoccupation with body image related to grossly inaccurate assessment of self as overweight. (Status: maintained) -- No Description Entered  Recurrent inappropriate compensatory behaviors in order to prevent weight gain, such as self-induced vomiting; misuse of laxatives, diuretics, enemas, or other medications; fasting; or excessive exercise. (Status: maintained) -- No Description Entered  Thoughts dominated by loss coupled with poor concentration, tearful spells, and confusion about the future. (Status: maintained) -- No Description Entered  Unresolved grief issues. (Status: maintained) -- No Description Entered  Medication Status compliance  Safety none  If Suicidal or Homicidal State Action Taken: unspecified  Current Risk: low Medications Klonapin (Dosage: '1mg'$ )  Lexapro (Dosage: '30mg'$ )  xyprexa (Dosage: 2.'5mg'$ /day)  Objectives Related Problem: Stabilize anxiety level while  increasing ability to function on a daily basis. Description: Learn and implement problem-solving strategies for realistically addressing worries. Target Date: 2022-01-22 Frequency: Daily Modality: individual Progress: 70%  Related Problem: Stabilize anxiety level while increasing ability to function on a daily basis. Description: Learn and implement calming skills to reduce overall anxiety and manage anxiety symptoms. Target Date: 2022-01-22 Frequency: Daily Modality: individual Progress: 50%  Related Problem: Stabilize anxiety level while increasing ability to function on a daily basis. Description: Describe situations, thoughts, feelings, and actions associated with anxieties and worries, their impact on functioning, and attempts to resolve them. Target Date: 2022-01-22 Frequency: Daily Modality: individual Progress: 50%  Client Response full compliance  Service Location Location, [address] [place], [place] [address]  Service Code cpt [phone]  Normalize/Reframe  Facilitate problem solving  Identified an insight  Identify automatic thoughts  Integration of affect  Distress tolerance skill  Self care activities  Journaling  Self-monitoring  Session Notes: Dx.: Depression, Anxiety and Eating Disorder Meds: Neurontin, Klonapin, Paxil, Zophran (nausea), Magnesium, Lybalbi, Praosin, Zyprexa 2.'5mg'$ .  Goals: Patient reports extreme levels of debilitating anxiety. She is seeking to reduce symptoms that interfere with day to day functioning. She wants to resolve her chronic fears of death and being abandoned. Goal date is 09-2020. Patient is having numerous medical symptoms and has episodes of fear that she is going to die. Revised  goal date is 12-23. In addition, she is experiencing complicated grief related to the death of her mother. She reports feeling "disconnected" from her surroundings. Goal mostly achieved 09-2020. Will continue to address this issue with additional  sessions. Goal 12-23.  Patient agrees to a video [employer]) session. She is at home and I am in my home office.  [name] states that her psychiatrist wants her to try either Ketamine or EMDR. We talked about benefits/risks of each. 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 Has asked me to provide an EMDR referral to her. 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 She is not sure who to ask to drive her to the procedure. Her eating has been relatively good lately and had "only one bad day" of purging. Dismayed that she cannot lose weight.  She has not seen [name] since she was told she agitates him. Spoke to his sister one time. Says she thinks about him all the time. Masy go visit with him today for a short time. Told her that it would be difficult, but ultimately beneficial for her. Anxiety is high.                                                                     [name], PhD Time: 4:65K-8:12X 39mnutes

## 2021-10-03 ENCOUNTER — Telehealth: Payer: Self-pay | Admitting: Physician Assistant

## 2021-10-03 NOTE — Telephone Encounter (Signed)
Returned call to patient. Pt states that she has access to her MyChart and she is aware that updated instructions are available for her review. Pt wanted to know if she needed an additional prep since she is also having an EGD, I informed pt that she just needs to follow colonoscopy prep instructions. Pt verbalized understanding and had no concerns at the end of the call.

## 2021-10-03 NOTE — Telephone Encounter (Signed)
Inbound call from patient requesting updated prep info for upcoming procedure scheduled 10/12/21 at 2:30. Please give a call back to further advise.  Thank you

## 2021-10-06 ENCOUNTER — Telehealth: Payer: Self-pay | Admitting: Gastroenterology

## 2021-10-06 NOTE — Telephone Encounter (Signed)
Returned call to patient. I informed her that the polish is fine. She has been advised that there is no way for [place] to know if she is allergic to the anesthesia unless she has had a reaction in the past. I informed pt that she will be monitored throughout the procedures and the CRNAs are present in case they need to provide any intervention. Pt verbalized understanding, but states that she is still nervous. I reassured pt that it is not as scary as it may sound.

## 2021-10-06 NOTE — Telephone Encounter (Signed)
Inbound call from patient concerned about the anesthesia for upcoming procedure 10/12/21. Patient is inquiring if she will be allergic or not also if she needs to take off her nail polish remover. Please give a call back to further advise.  Thank you

## 2021-10-07 ENCOUNTER — Ambulatory Visit (INDEPENDENT_AMBULATORY_CARE_PROVIDER_SITE_OTHER): Payer: Medicare Other | Admitting: Psychology

## 2021-10-07 DIAGNOSIS — F411 Generalized anxiety disorder: Secondary | ICD-10-CM

## 2021-10-07 NOTE — Progress Notes (Signed)
[name] is a 66 y.o. female patient   Date: 10/07/2021  Treatment Plan Diagnosis F43.21 (Adjustment Disorder, With depressed mood) [n/a]  F41.1 (Generalized anxiety disorder) [n/a]  F50.9 (Unspecified feeding or eating disorder) [n/a]  W09.81 (Uncomplicated bereavement) [n/a]  Symptoms Diminished interest in or enjoyment of activities. (Status: maintained) -- No Description Entered  Excessive and/or unrealistic worry that is difficult to control occurring more days than not for at least 6 months about a number of events or activities. (Status: maintained) -- No Description Entered  Intense fear of gaining weight or becoming fat, even though underweight. (Status: maintained) -- No Description Entered  Lack of energy. (Status: maintained) -- No Description Entered  Persistent preoccupation with body image related to grossly inaccurate assessment of self as overweight. (Status: maintained) -- No Description Entered  Recurrent inappropriate compensatory behaviors in order to prevent weight gain, such as self-induced vomiting; misuse of laxatives, diuretics, enemas, or other medications; fasting; or excessive exercise. (Status: maintained) -- No Description Entered  Thoughts dominated by loss coupled with poor concentration, tearful spells, and confusion about the future. (Status: maintained) -- No Description Entered  Unresolved grief issues. (Status: maintained) -- No Description Entered  Medication Status compliance  Safety none  If Suicidal or Homicidal State Action Taken: unspecified  Current Risk: low Medications Klonapin (Dosage: '1mg'$ )  Lexapro (Dosage: '30mg'$ )  xyprexa (Dosage: 2.'5mg'$ /day)  Objectives Related Problem: Stabilize anxiety level while increasing ability to function on a daily basis. Description: Learn and implement problem-solving strategies for realistically addressing worries. Target Date: 2022-01-22 Frequency: Daily Modality:  individual Progress: 70%  Related Problem: Stabilize anxiety level while increasing ability to function on a daily basis. Description: Learn and implement calming skills to reduce overall anxiety and manage anxiety symptoms. Target Date: 2022-01-22 Frequency: Daily Modality: individual Progress: 50%  Related Problem: Stabilize anxiety level while increasing ability to function on a daily basis. Description: Describe situations, thoughts, feelings, and actions associated with anxieties and worries, their impact on functioning, and attempts to resolve them. Target Date: 2022-01-22 Frequency: Daily Modality: individual Progress: 50%  Client Response full compliance  Service Location Location, [address] [place], [place] [address]  Service Code cpt [phone]  Normalize/Reframe  Facilitate problem solving  Identified an insight  Identify automatic thoughts  Integration of affect  Distress tolerance skill  Self care activities  Journaling  Self-monitoring  Session Notes: Dx.: Depression, Anxiety and Eating Disorder Meds: Neurontin, Klonapin, Paxil, Zophran (nausea), Magnesium, Lybalbi, Praosin, Zyprexa 2.'5mg'$ .  Goals: Patient reports extreme levels of debilitating anxiety. She is seeking to reduce symptoms that interfere with day to day functioning. She wants to resolve her chronic fears of death and being abandoned. Goal date is 09-2020. Patient is having numerous medical symptoms and has episodes of fear that she is going to die. Revised goal date is 12-23. In addition, she is experiencing complicated grief related to the death of her mother. She reports feeling "disconnected" from her surroundings. Goal mostly achieved 09-2020. Will continue to address this issue with additional sessions. Goal 12-23.  Patient agrees to a video [employer]) session. She is at home and I am in my home office.  [name] reports that her mammogram revealed abnormal results and she has to return today for more  tests.She is not nervous about the mammogram, but is very anxious about her colonoscopy and endoscopy. She is "terrified" about the anesthesia and fears "not waking up". She  has made arrangements for someone to take her to the procedure. Monday is the anniversary of her mom's death and she is thinking about what was happening at this time two years ago. She says her brother will likely call her on the anniversary. She got a new patient appointment packet for the [facility] appointment that I set up for her with Bambi Cottle. She is looking forward to meeting with Bambi and hoping an earlier appointment can be arranged. We talked about how to contain her anxiety ahead of the GI procedure and NOT focus on the prospect of not waking up. She mostly fears the experience of letting go of control. It triggers feelings of vulnerability that results in considerable anxiety. The fear of not being in control has been a lifelong struggle which we will explore further.                                                                       [name], PhD Time: 3:81O-1:75Z 47mnutes

## 2021-10-11 ENCOUNTER — Telehealth: Payer: Self-pay | Admitting: Physician Assistant

## 2021-10-11 NOTE — Telephone Encounter (Signed)
Returned patient call and suggested to patient to go purchase a bottle of magnesium citrate and take ASAP.  If no BM before end of day we will reschedule her procedure for another time with an extended prep.  Patient will call to report.

## 2021-10-11 NOTE — Telephone Encounter (Signed)
PT is scheduled for an EGD/colon on tomorrow and her prep has not taken affect. She wants to know what else she can take to make it work. Please advise.

## 2021-10-11 NOTE — Telephone Encounter (Signed)
Patient is calling back to follow up on call from this morning. Patient is scheduled to have EGD and colonoscopy tomorrow and patient stated that the prep has still not started to work. Patient is requesting a call back to discuss. Please advise.

## 2021-10-12 ENCOUNTER — Ambulatory Visit (AMBULATORY_SURGERY_CENTER): Payer: Medicare Other | Admitting: Gastroenterology

## 2021-10-12 ENCOUNTER — Encounter: Payer: Self-pay | Admitting: Gastroenterology

## 2021-10-12 VITALS — BP 138/80 | HR 64 | Temp 98.6°F | Resp 14 | Ht 63.0 in | Wt 135.0 lb

## 2021-10-12 DIAGNOSIS — D122 Benign neoplasm of ascending colon: Secondary | ICD-10-CM

## 2021-10-12 DIAGNOSIS — K5909 Other constipation: Secondary | ICD-10-CM

## 2021-10-12 DIAGNOSIS — D125 Benign neoplasm of sigmoid colon: Secondary | ICD-10-CM

## 2021-10-12 DIAGNOSIS — K219 Gastro-esophageal reflux disease without esophagitis: Secondary | ICD-10-CM

## 2021-10-12 DIAGNOSIS — Z1211 Encounter for screening for malignant neoplasm of colon: Secondary | ICD-10-CM | POA: Diagnosis not present

## 2021-10-12 MED ORDER — SODIUM CHLORIDE 0.9 % IV SOLN: 500.0000 mL | Freq: Once | INTRAVENOUS | Status: DC

## 2021-10-12 NOTE — Op Note (Signed)
[facility] Patient Name: [name] Procedure Date: 10/12/2021 2:36 PM MRN: [id number] Endoscopist: Mauri Pole , MD Age: 66 Referring MD:  Date of Birth: [birth year] Gender: Female Account #: [id number] Procedure:                Colonoscopy Indications:              Screening for colorectal malignant neoplasm Medicines:                Monitored Anesthesia Care Procedure:                Pre-Anesthesia Assessment:                           - Prior to the procedure, a History and Physical                            was performed, and patient medications and                            allergies were reviewed. The patient's tolerance of                            previous anesthesia was also reviewed. The risks                            and benefits of the procedure and the sedation                            options and risks were discussed with the patient.                            All questions were answered, and informed consent                            was obtained. Prior Anticoagulants: The patient has                            taken no previous anticoagulant or antiplatelet                            agents. ASA Grade Assessment: II - A patient with                            mild systemic disease. After reviewing the risks                            and benefits, the patient was deemed in                            satisfactory condition to undergo the procedure.                           After obtaining informed consent, the colonoscope  was passed under direct vision. Throughout the                            procedure, the patient's blood pressure, pulse, and                            oxygen saturations were monitored continuously. The                            Olympus PCF-[id number] (#[id number]) Colonoscope was                            introduced through the anus and advanced to the the                            cecum,  identified by appendiceal orifice and                            ileocecal valve. The colonoscopy was performed                            without difficulty. The patient tolerated the                            procedure well. The quality of the bowel                            preparation was good. The ileocecal valve,                            appendiceal orifice, and rectum were photographed. Scope In: 2:48:06 PM Scope Out: 3:11:57 PM Scope Withdrawal Time: 0 hours 16 minutes 38 seconds  Total Procedure Duration: 0 hours 23 minutes 51 seconds  Findings:                 The perianal and digital rectal examinations were                            normal.                           Two sessile polyps were found in the sigmoid colon                            and ascending colon. The polyps were 3 to 5 mm in                            size. These polyps were removed with a cold snare.                            Resection and retrieval were complete.                           Non-bleeding external and internal hemorrhoids were  found during retroflexion. The hemorrhoids were                            medium-sized. Complications:            No immediate complications. Estimated Blood Loss:     Estimated blood loss was minimal. Impression:               - Two 3 to 5 mm polyps in the sigmoid colon and in                            the ascending colon, removed with a cold snare.                            Resected and retrieved.                           - Non-bleeding external and internal hemorrhoids. Recommendation:           - Patient has a contact number available for                            emergencies. The signs and symptoms of potential                            delayed complications were discussed with the                            patient. Return to normal activities tomorrow.                            Written discharge instructions were provided to  the                            patient.                           - Resume previous diet.                           - Continue present medications.                           - Await pathology results.                           - Repeat colonoscopy in 5-10 years for surveillance                            based on pathology results. Mauri Pole, MD 10/12/2021 3:21:41 PM This report has been signed electronically.

## 2021-10-12 NOTE — Progress Notes (Signed)
Called to room to assist during endoscopic procedure.  Patient ID and intended procedure confirmed with present staff. Received instructions for my participation in the procedure from the performing physician.  

## 2021-10-12 NOTE — Patient Instructions (Signed)
YOU HAD AN ENDOSCOPIC PROCEDURE TODAY AT THE  ENDOSCOPY CENTER:   Refer to the procedure report that was given to you for any specific questions about what was found during the examination.  If the procedure report does not answer your questions, please call your gastroenterologist to clarify.  If you requested that your care partner not be given the details of your procedure findings, then the procedure report has been included in a sealed envelope for you to review at your convenience later.  YOU SHOULD EXPECT: Some feelings of bloating in the abdomen. Passage of more gas than usual.  Walking can help get rid of the air that was put into your GI tract during the procedure and reduce the bloating. If you had a lower endoscopy (such as a colonoscopy or flexible sigmoidoscopy) you may notice spotting of blood in your stool or on the toilet paper. If you underwent a bowel prep for your procedure, you may not have a normal bowel movement for a few days.  Please Note:  You might notice some irritation and congestion in your nose or some drainage.  This is from the oxygen used during your procedure.  There is no need for concern and it should clear up in a day or so.  SYMPTOMS TO REPORT IMMEDIATELY:  Following lower endoscopy (colonoscopy or flexible sigmoidoscopy):  Excessive amounts of blood in the stool  Significant tenderness or worsening of abdominal pains  Swelling of the abdomen that is new, acute  Fever of 100F or higher  Following upper endoscopy (EGD)  Vomiting of blood or coffee ground material  New chest pain or pain under the shoulder blades  Painful or persistently difficult swallowing  New shortness of breath  Fever of 100F or higher  Black, tarry-looking stools  For urgent or emergent issues, a gastroenterologist can be reached at any hour by calling [phone]. Do not use MyChart messaging for urgent concerns.    DIET:  We do recommend a small meal at first, but  then you may proceed to your regular diet.  Drink plenty of fluids but you should avoid alcoholic beverages for 24 hours.  ACTIVITY:  You should plan to take it easy for the rest of today and you should NOT DRIVE or use heavy machinery until tomorrow (because of the sedation medicines used during the test).    FOLLOW UP: Our staff will call the number listed on your records the next business day following your procedure.  We will call around 7:15- 8:00 am to check on you and address any questions or concerns that you may have regarding the information given to you following your procedure. If we do not reach you, we will leave a message.  If you develop any symptoms (ie: fever, flu-like symptoms, shortness of breath, cough etc.) before then, please call [phone].  If you test positive for Covid 19 in the 2 weeks post procedure, please call and report this information to us.    If any biopsies were taken you will be contacted by phone or by letter within the next 1-3 weeks.  Please call us at [phone] if you have not heard about the biopsies in 3 weeks.    SIGNATURES/CONFIDENTIALITY: You and/or your care partner have signed paperwork which will be entered into your electronic medical record.  These signatures attest to the fact that that the information above on your After Visit Summary has been reviewed and is understood.  Full responsibility of the confidentiality   of this discharge information lies with you and/or your care-partner.  

## 2021-10-12 NOTE — Progress Notes (Signed)
VS completed by CW.   Pt's states no medical or surgical changes since previsit or office visit.  Patient very anxious

## 2021-10-12 NOTE — Progress Notes (Unsigned)
[facility] History and Physical   Primary Care Physician:  Harrison Mons, PA   Reason for Procedure:  GERD, Bulimia, h/o laxative abuse, chronic constipation  Plan:    EGD and colonoscopy with possible interventions as needed     HPI: [name] is a very pleasant 66 y.o. female here for EGD and colonoscopy for persistent GERD symptoms, epigastric pain, chronic constipation with h/o bulimia and laxative abuse.   The risks and benefits as well as alternatives of endoscopic procedure(s) have been discussed and reviewed. All questions answered. The patient agrees to proceed.    Past Medical History:  Diagnosis Date   Acute pain of right knee 03/07/2017   Anemia    Anorexia    Anxiety    Bitemporal hemianopsia    Borderline personality disorder ([place])    Bulimia    Depression    Hyperlipidemia    Ocular migraine    Osteoporosis    Pigmentary retinal dystrophy    Retinitis pigmentosa    TBI (traumatic brain injury) ([place])     Past Surgical History:  Procedure Laterality Date   CESAREAN SECTION      Prior to Admission medications   Medication Sig Start Date End Date Taking? Authorizing Provider  clonazePAM (KLONOPIN) 0.5 MG tablet Take 0.5-1 mg by mouth daily.   Yes [provider]  famotidine (PEPCID) 40 MG tablet Take 1 tablet (40 mg total) by mouth 2 (two) times daily. 07/08/21  Yes Levin Erp, PA  OLANZapine (ZYPREXA) 2.5 MG tablet Take 1 tablet by mouth every 14 (fourteen) days.   Yes [provider]  PARoxetine (PAXIL) 20 MG tablet Take 30 mg by mouth daily.   Yes [provider]  polyethylene glycol (MIRALAX / GLYCOLAX) 17 g packet as needed. 07/14/20  Yes [provider]  sucralfate (CARAFATE) 1 GM/10ML suspension Take by mouth. 02/18/21  Yes [provider]  pantoprazole (PROTONIX) 40 MG tablet Take 1 tablet (40 mg total) by mouth 2 (two) times daily. 07/08/21   Levin Erp, PA   Probiotic Product (PROBIOTIC PO) Take 1 capsule by mouth as needed.    [provider]    Current Outpatient Medications  Medication Sig Dispense Refill   clonazePAM (KLONOPIN) 0.5 MG tablet Take 0.5-1 mg by mouth daily.     famotidine (PEPCID) 40 MG tablet Take 1 tablet (40 mg total) by mouth 2 (two) times daily. 60 tablet 5   OLANZapine (ZYPREXA) 2.5 MG tablet Take 1 tablet by mouth every 14 (fourteen) days.     PARoxetine (PAXIL) 20 MG tablet Take 30 mg by mouth daily.     polyethylene glycol (MIRALAX / GLYCOLAX) 17 g packet as needed.     sucralfate (CARAFATE) 1 GM/10ML suspension Take by mouth.     pantoprazole (PROTONIX) 40 MG tablet Take 1 tablet (40 mg total) by mouth 2 (two) times daily. 60 tablet 5   Probiotic Product (PROBIOTIC PO) Take 1 capsule by mouth as needed.     Current Facility-Administered Medications  Medication Dose Route Frequency Provider Last Rate Last Admin   0.9 %  sodium chloride infusion  500 mL Intravenous Once Mauri Pole, MD        Allergies as of 10/12/2021 - Review Complete 10/12/2021  Allergen Reaction Noted   Carbamazepine Palpitations 03/13/2014   Prochlorperazine edisylate  08/18/2011   Keflex [cephalexin] Rash and Other (See Comments) 07/16/2014   Augmentin [amoxicillin-pot clavulanate] Diarrhea    Compazine [prochlorperazine]  11/19/2020   Epinephrine Palpitations 08/18/2011    Family History  Problem Relation Age of Onset   Polymyalgia rheumatica Mother    Osteoporosis Mother    Cancer Father    Heart disease Father        CHF   Diabetes Sister        Obesity   Stomach cancer Maternal Grandfather    Colon cancer Neg Hx    Esophageal cancer Neg Hx    Rectal cancer Neg Hx     Social History   Socioeconomic History   Marital status: Divorced    Spouse name: Not on file   Number of children: 1   Years of education: Not on file   Highest education level: Not on file  Occupational History   Occupation:  Hotel manager education    Employer: [employer]   Occupation: Designer, fashion/clothing   Occupation: Contractor   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  Tobacco Use   Smoking status: Never   Smokeless tobacco: Never  Vaping Use   Vaping Use: Never used  Substance and Sexual Activity   Alcohol use: No   Drug use: No   Sexual activity: Not on file  Other Topics Concern   Not on file  Social History Narrative   ** Merged History Encounter **       Social Determinants of Health   Financial Resource Strain: Not on file  Food Insecurity: Not on file  Transportation Needs: Not on file  Physical Activity: Not on file  Stress: Not on file  Social Connections: Not on file  Intimate Partner Violence: Not on file    Review of Systems:  All other review of systems negative except as mentioned in the HPI.  Physical Exam: Vital signs in last 24 hours: BP (!) 133/99   Pulse 77   Temp 98.6 F (37 C) (Temporal)   Ht '5\' 3"'$  (1.6 m)   Wt 135 lb (61.2 kg)   LMP 02/18/2004   SpO2 96%   BMI 23.91 kg/m  General:   Alert, NAD Lungs:  Clear .   Heart:  Regular rate and rhythm Abdomen:  Soft, nontender and nondistended. Neuro/Psych:  Alert and cooperative. Normal mood and affect. A and O x 3  Reviewed labs, radiology imaging, old records and pertinent past GI work up  Patient is appropriate for planned procedure(s) and anesthesia in an ambulatory setting   K. Denzil Magnuson , MD [phone]   Addendum:  Patient declined anaesthesia and opted to proceed with only un sedated colonoscopy

## 2021-10-12 NOTE — Progress Notes (Signed)
PT taken to PACU. Monitors in place. VSS. Report given to RN. 

## 2021-10-13 ENCOUNTER — Telehealth: Payer: Self-pay

## 2021-10-13 NOTE — Telephone Encounter (Signed)
Follow up call to pt, lm for pt to call if having any difficulty with normal activities or eating and drinking.  Also to call if any other questions or concerns.  

## 2021-10-14 ENCOUNTER — Ambulatory Visit (INDEPENDENT_AMBULATORY_CARE_PROVIDER_SITE_OTHER): Payer: Medicare Other | Admitting: Psychology

## 2021-10-14 DIAGNOSIS — F411 Generalized anxiety disorder: Secondary | ICD-10-CM

## 2021-10-14 NOTE — Progress Notes (Signed)
[name] is a 66 y.o. female patient   Date: 10/14/2021  Treatment Plan Diagnosis F43.21 (Adjustment Disorder, With depressed mood) [n/a]  F41.1 (Generalized anxiety disorder) [n/a]  F50.9 (Unspecified feeding or eating disorder) [n/a]  I95.18 (Uncomplicated bereavement) [n/a]  Symptoms Diminished interest in or enjoyment of activities. (Status: maintained) -- No Description Entered  Excessive and/or unrealistic worry that is difficult to control occurring more days than not for at least 6 months about a number of events or activities. (Status: maintained) -- No Description Entered  Intense fear of gaining weight or becoming fat, even though underweight. (Status: maintained) -- No Description Entered  Lack of energy. (Status: maintained) -- No Description Entered  Persistent preoccupation with body image related to grossly inaccurate assessment of self as overweight. (Status: maintained) -- No Description Entered  Recurrent inappropriate compensatory behaviors in order to prevent weight gain, such as self-induced vomiting; misuse of laxatives, diuretics, enemas, or other medications; fasting; or excessive exercise. (Status: maintained) -- No Description Entered  Thoughts dominated by loss coupled with poor concentration, tearful spells, and confusion about the future. (Status: maintained) -- No Description Entered  Unresolved grief issues. (Status: maintained) -- No Description Entered  Medication Status compliance  Safety none  If Suicidal or Homicidal State Action Taken: unspecified  Current Risk: low Medications Klonapin (Dosage: '1mg'$ )  Lexapro (Dosage: '30mg'$ )  xyprexa (Dosage: 2.'5mg'$ /day)  Objectives Related Problem: Stabilize anxiety level while increasing ability to function on a daily basis. Description: Learn and implement problem-solving strategies for realistically addressing worries. Target Date:  2022-01-22 Frequency: Daily Modality: individual Progress: 70%  Related Problem: Stabilize anxiety level while increasing ability to function on a daily basis. Description: Learn and implement calming skills to reduce overall anxiety and manage anxiety symptoms. Target Date: 2022-01-22 Frequency: Daily Modality: individual Progress: 50%  Related Problem: Stabilize anxiety level while increasing ability to function on a daily basis. Description: Describe situations, thoughts, feelings, and actions associated with anxieties and worries, their impact on functioning, and attempts to resolve them. Target Date: 2022-01-22 Frequency: Daily Modality: individual Progress: 50%  Client Response full compliance  Service Location Location, [address] [place], [place] [address]  Service Code cpt [phone]  Normalize/Reframe  Facilitate problem solving  Identified an insight  Identify automatic thoughts  Integration of affect  Distress tolerance skill  Self care activities  Journaling  Self-monitoring  Session Notes: Dx.: Depression, Anxiety and Eating Disorder Meds: Neurontin, Klonapin, Paxil, Zophran (nausea), Magnesium, Lybalbi, Praosin, Zyprexa 2.'5mg'$ .  Goals: Patient reports extreme levels of debilitating anxiety. She is seeking to reduce symptoms that interfere with day to day functioning. She wants to resolve her chronic fears of death and being abandoned. Goal date is 09-2020. Patient is having numerous medical symptoms and has episodes of fear that she is going to die. Revised goal date is 12-23. In addition, she is experiencing complicated grief related to the death of her mother. She reports feeling "disconnected" from her surroundings. Goal mostly achieved 09-2020. Will continue to address this issue with additional sessions. Goal 12-23.  Patient agrees to a video [employer]) session. She is at home and I am in my home office.  [name] says her mammogram was normal. She had a  colonoscopy and did it without anesthesia. Because of that, she did not have an endoscopy. All went well and she  was pleased to not have the anesthesia. She said that her doctor told her she need CBT to address her eating disorder. Told her that I agree that she needs specialty treatment for her eating disorder. Monday was the anniversary of her mother's passing and she is surprised that her brother did not call her. She did not call him because she did not want to hear his analytic discussion about the relationship with their mother.  She would still like to move away from apartment, but says she does not have the funds (except from 401k which she does not want to touch).  [name]'s nephew is coming to clean her carpets today. She says she sees him 1 time a week and says he has been resting every time she is there.  I revisited the concept of her going to a treatment facility for her eating disorder. She feels that it has been a good, more manageable, couple of weeks. Will consider, depending on how she is over next few weeks and the impact of the EMDR treatment.                                                                           [name], PhD Time: 3:14H-7:02O 29mnutes

## 2021-10-17 ENCOUNTER — Encounter: Payer: Self-pay | Admitting: Gastroenterology

## 2021-10-21 ENCOUNTER — Ambulatory Visit (INDEPENDENT_AMBULATORY_CARE_PROVIDER_SITE_OTHER): Payer: Medicare Other | Admitting: Psychology

## 2021-10-21 DIAGNOSIS — F509 Eating disorder, unspecified: Secondary | ICD-10-CM

## 2021-10-21 DIAGNOSIS — F411 Generalized anxiety disorder: Secondary | ICD-10-CM | POA: Diagnosis not present

## 2021-10-21 MED ORDER — LINACLOTIDE 145 MCG PO CAPS: 145.0000 ug | ORAL_CAPSULE | Freq: Every day | ORAL | 3 refills | Status: DC

## 2021-10-21 NOTE — Progress Notes (Signed)
[name] is a 66 y.o. female patient   Date: 10/21/2021  Treatment Plan Diagnosis F43.21 (Adjustment Disorder, With depressed mood) [n/a]  F41.1 (Generalized anxiety disorder) [n/a]  F50.9 (Unspecified feeding or eating disorder) [n/a]  C16.60 (Uncomplicated bereavement) [n/a]  Symptoms Diminished interest in or enjoyment of activities. (Status: maintained) -- No Description Entered  Excessive and/or unrealistic worry that is difficult to control occurring more days than not for at least 6 months about a number of events or activities. (Status: maintained) -- No Description Entered  Intense fear of gaining weight or becoming fat, even though underweight. (Status: maintained) -- No Description Entered  Lack of energy. (Status: maintained) -- No Description Entered  Persistent preoccupation with body image related to grossly inaccurate assessment of self as overweight. (Status: maintained) -- No Description Entered  Recurrent inappropriate compensatory behaviors in order to prevent weight gain, such as self-induced vomiting; misuse of laxatives, diuretics, enemas, or other medications; fasting; or excessive exercise. (Status: maintained) -- No Description Entered  Thoughts dominated by loss coupled with poor concentration, tearful spells, and confusion about the future. (Status: maintained) -- No Description Entered  Unresolved grief issues. (Status: maintained) -- No Description Entered  Medication Status compliance  Safety none  If Suicidal or Homicidal State Action Taken: unspecified  Current Risk: low Medications Klonapin (Dosage: '1mg'$ )  Lexapro (Dosage: '30mg'$ )  xyprexa (Dosage: 2.'5mg'$ /day)  Objectives Related Problem: Stabilize anxiety level while increasing ability to function on a daily basis. Description: Learn and implement problem-solving strategies for realistically addressing  worries. Target Date: 2022-01-22 Frequency: Daily Modality: individual Progress: 70%  Related Problem: Stabilize anxiety level while increasing ability to function on a daily basis. Description: Learn and implement calming skills to reduce overall anxiety and manage anxiety symptoms. Target Date: 2022-01-22 Frequency: Daily Modality: individual Progress: 50%  Related Problem: Stabilize anxiety level while increasing ability to function on a daily basis. Description: Describe situations, thoughts, feelings, and actions associated with anxieties and worries, their impact on functioning, and attempts to resolve them. Target Date: 2022-01-22 Frequency: Daily Modality: individual Progress: 50%  Client Response full compliance  Service Location Location, [address] [place], [place] [address]  Service Code cpt [phone]  Normalize/Reframe  Facilitate problem solving  Identified an insight  Identify automatic thoughts  Integration of affect  Distress tolerance skill  Self care activities  Journaling  Self-monitoring  Session Notes: Dx.: Depression, Anxiety and Eating Disorder Meds: Neurontin, Klonapin, Paxil, Zophran (nausea), Magnesium, Lybalbi, Praosin, Zyprexa 2.'5mg'$ .  Goals: Patient reports extreme levels of debilitating anxiety. She is seeking to reduce symptoms that interfere with day to day functioning. She wants to resolve her chronic fears of death and being abandoned. Goal date is 09-2020. Patient is having numerous medical symptoms and has episodes of fear that she is going to die. Revised goal date is 12-23. In addition, she is experiencing complicated grief related to the death of her mother. She reports feeling "disconnected" from her surroundings. Goal mostly achieved 09-2020. Will continue to address this issue with additional sessions. Goal 12-23.  Patient agrees to a video [employer]) session. She is at home and I am in my home office.  [name] talked about her ongoing  dissociative experiences since just before her mother's death. She  is hoping her EMDR intervention with help resolve. Like her anxiety, the dissociation improves as the day progresses.  Her eating disorder remains active as well. I talked to her about moving forward with seeking specialized help with her eating disorder. She wonders if she sabatoges herself. Is there an underlying motive we need to explore? We talked about her eating disorder as a means to strive toward having control. The eating disorder has been present since age 56. Encouraged her to explore "what would it mean to lose weight"?                                                                             [name], PhD Time: 4:48J-8:56D 58mnutes

## 2021-10-23 ENCOUNTER — Encounter: Payer: Self-pay | Admitting: Gastroenterology

## 2021-10-24 ENCOUNTER — Other Ambulatory Visit (HOSPITAL_COMMUNITY): Payer: Self-pay

## 2021-10-25 ENCOUNTER — Other Ambulatory Visit (HOSPITAL_COMMUNITY): Payer: Self-pay

## 2021-10-28 ENCOUNTER — Ambulatory Visit (INDEPENDENT_AMBULATORY_CARE_PROVIDER_SITE_OTHER): Payer: Medicare Other | Admitting: Psychology

## 2021-10-28 DIAGNOSIS — F411 Generalized anxiety disorder: Secondary | ICD-10-CM | POA: Diagnosis not present

## 2021-10-28 NOTE — Progress Notes (Signed)
[name] is a 66 y.o. female patient   Date: 10/28/2021  Treatment Plan Diagnosis F43.21 (Adjustment Disorder, With depressed mood) [n/a]  F41.1 (Generalized anxiety disorder) [n/a]  F50.9 (Unspecified feeding or eating disorder) [n/a]  P54.65 (Uncomplicated bereavement) [n/a]  Symptoms Diminished interest in or enjoyment of activities. (Status: maintained) -- No Description Entered  Excessive and/or unrealistic worry that is difficult to control occurring more days than not for at least 6 months about a number of events or activities. (Status: maintained) -- No Description Entered  Intense fear of gaining weight or becoming fat, even though underweight. (Status: maintained) -- No Description Entered  Lack of energy. (Status: maintained) -- No Description Entered  Persistent preoccupation with body image related to grossly inaccurate assessment of self as overweight. (Status: maintained) -- No Description Entered  Recurrent inappropriate compensatory behaviors in order to prevent weight gain, such as self-induced vomiting; misuse of laxatives, diuretics, enemas, or other medications; fasting; or excessive exercise. (Status: maintained) -- No Description Entered  Thoughts dominated by loss coupled with poor concentration, tearful spells, and confusion about the future. (Status: maintained) -- No Description Entered  Unresolved grief issues. (Status: maintained) -- No Description Entered  Medication Status compliance  Safety none  If Suicidal or Homicidal State Action Taken: unspecified  Current Risk: low Medications Klonapin (Dosage: '1mg'$ )  Lexapro (Dosage: '30mg'$ )  xyprexa (Dosage: 2.'5mg'$ /day)  Objectives Related Problem: Stabilize anxiety level while increasing ability to function on a daily basis. Description: Learn and implement problem-solving strategies for realistically addressing worries. Target Date: 2022-01-22 Frequency: Daily Modality: individual Progress: 70%   Related Problem: Stabilize anxiety level while increasing ability to function on a daily basis. Description: Learn and implement calming skills to reduce overall anxiety and manage anxiety symptoms. Target Date: 2022-01-22 Frequency: Daily Modality: individual Progress: 50%  Related Problem: Stabilize anxiety level while increasing ability to function on a daily basis. Description: Describe situations, thoughts, feelings, and actions associated with anxieties and worries, their impact on functioning, and attempts to resolve them. Target Date: 2022-01-22 Frequency: Daily Modality: individual Progress: 50%  Client Response full compliance  Service Location Location, [address] [place], [place] [address]  Service Code cpt [phone]  Normalize/Reframe  Facilitate problem solving  Identified an insight  Identify automatic thoughts  Integration of affect  Distress tolerance skill  Self care activities  Journaling  Self-monitoring  Session Notes: Dx.: Depression, Anxiety and Eating Disorder Meds: Neurontin, Klonapin, Paxil, Zophran (nausea), Magnesium, Lybalbi, Praosin, Zyprexa 2.'5mg'$ .  Goals: Patient reports extreme levels of debilitating anxiety. She is seeking to reduce symptoms that interfere with day to day functioning. She wants to resolve her chronic fears of death and being abandoned. Goal date is 09-2020. Patient is having numerous medical symptoms and has episodes of fear that she is going to die. Revised goal date is 12-23. In addition, she is experiencing complicated grief related to the death of her mother. She reports feeling "disconnected" from her surroundings. Goal mostly achieved 09-2020. Will continue to address this issue with additional sessions. Goal 12-23.  Patient agrees to a video [employer]) session. She is at home and I am in my home office.  [name] talked about have intense nightmares. She sleeps very little and only remembers a minimal amount about content. She  said that she read article about a celebrities' son who took his own life. He was suffering dissociation and it made her anxious given she has the same symptoms. We talked about her history with dissociation  and why her condition is more stable. She is very pleased that her eating has been "clean" for 2 weeks. She says her purging is also under control. She does say she cannot go to the bathroom without laxatives. Has talked with her doctor and was told to check with insurance to see what they would cover. It will cover bentyl prescription, but she is reluctant to take it and needs to speak with her doctor. She will be going to [place] for a conference next week. She has numerous anxieties about it. We talked about best strategies to manage that anxiety.                                                                              [name], PhD Time: 2:40X-7:35H 54mnutes

## 2021-11-04 ENCOUNTER — Ambulatory Visit: Payer: Medicare Other | Admitting: Psychology

## 2021-11-10 ENCOUNTER — Encounter: Payer: Self-pay | Admitting: Gastroenterology

## 2021-11-10 ENCOUNTER — Ambulatory Visit (INDEPENDENT_AMBULATORY_CARE_PROVIDER_SITE_OTHER): Payer: Medicare Other | Admitting: Gastroenterology

## 2021-11-10 VITALS — BP 112/82 | HR 79 | Ht 63.0 in | Wt 130.0 lb

## 2021-11-10 DIAGNOSIS — R112 Nausea with vomiting, unspecified: Secondary | ICD-10-CM

## 2021-11-10 DIAGNOSIS — K219 Gastro-esophageal reflux disease without esophagitis: Secondary | ICD-10-CM | POA: Diagnosis not present

## 2021-11-10 DIAGNOSIS — K581 Irritable bowel syndrome with constipation: Secondary | ICD-10-CM | POA: Diagnosis not present

## 2021-11-10 DIAGNOSIS — R1013 Epigastric pain: Secondary | ICD-10-CM

## 2021-11-10 MED ORDER — LINACLOTIDE 290 MCG PO CAPS: 290.0000 ug | ORAL_CAPSULE | Freq: Every day | ORAL | 1 refills | Status: DC

## 2021-11-10 NOTE — Patient Instructions (Signed)
We have sent the following medications to your pharmacy for you to pick up at your convenience: Linzess 290 mcg daily. Samples have been provided in the office today to get you started.   Start samples of FDgard 1 capsule by mouth three times a day as needed. This can be purchased over the counter.   Discontinue Carafate.   You will be contacted by [facility] in the next 7 days to arrange a RUQ ultrasound.  The number on your caller ID will be [phone], please answer when they call.  If you have not heard from them in 7 days please call [phone] to schedule.    You have been scheduled for an endoscopy. Please follow written instructions given to you at your visit today. If you use inhalers (even only as needed), please bring them with you on the day of your procedure.  The [facility] providers would like to encourage you to use [facility] to communicate with providers for non-urgent requests or questions.  Due to long hold times on the telephone, sending your provider a message by [facility] may be a faster and more efficient way to get a response.  Please allow 48 business hours for a response.  Please remember that this is for non-urgent requests.   Due to recent changes in healthcare laws, you may see the results of your imaging and laboratory studies on MyChart before your provider has had a chance to review them.  We understand that in some cases there may be results that are confusing or concerning to you. Not all laboratory results come back in the same time frame and the provider may be waiting for multiple results in order to interpret others.  Please give [place] 48 hours in order for your provider to thoroughly review all the results before contacting the office for clarification of your results.

## 2021-11-10 NOTE — Progress Notes (Signed)
[name]    [id number]    06-06-1955  Primary Care Physician:Jeffery, Domingo Mend, [place]  Referring Physician: Harrison Mons, [place],   [id number]   Chief complaint:  Nausea, abdominal pain, constipation  HPI:  66 year old very pleasant female with history of bulimia nervosa, generalized anxiety disorder with complaints of abdominal pain, nausea and constipation She has been struggling with eating disorder for many years now, she has significant fluctuation in her weight.   She was scheduled for EGD along with colonoscopy but patient did not want to proceed with the exam last month because she was anxious about going through anesthesia and only did unsedated colonoscopy  She continues to have nausea almost daily, is no longer vomiting.  Her bowel habits remain irregular, she stopped taking laxatives and is struggling to have a bowel movement sometimes.  In the past she was taking 50 Ex-Lax tablets on average every other day to have a bowel movement.  Patient states that she does not go if she does not take Ex-Lax.  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  No dysphagia or odynophagia.   CT abdomen and pelvis with contrast 05/24/19: small punctate L renal calculi otherwise normal exam   Upper GI series 03/07/18: Mild esophageal dysmotility otherwise negative exam   Cologaurd 2020: Negative  Colonscopy 10/12/21 - Two 3 to 5 mm polyps in the sigmoid colon and in the ascending colon, removed with a cold snare. Resected and retrieved. - Non-bleeding external and internal hemorrhoids.   Outpatient Encounter Medications as of 11/10/2021  Medication Sig   clonazePAM (KLONOPIN) 0.5 MG tablet Take 0.5-1 mg by mouth daily.   famotidine (PEPCID) 40 MG tablet Take 1 tablet (40 mg total) by mouth  2 (two) times daily.   linaclotide (LINZESS) 145 MCG CAPS capsule Take 1 capsule (145 mcg total) by mouth daily before breakfast.   loteprednol (LOTEMAX) 0.5 % ophthalmic suspension 1 drop 4 (four) times daily.   OLANZapine (ZYPREXA) 2.5 MG tablet Take 1 tablet by mouth every 14 (fourteen) days.   pantoprazole (PROTONIX) 40 MG tablet Take 1 tablet (40 mg total) by mouth 2 (two) times daily.   PARoxetine (PAXIL) 20 MG tablet Take 30 mg by mouth daily.   polyethylene glycol (MIRALAX / GLYCOLAX) 17 g packet as needed.   Probiotic Product (PROBIOTIC PO) Take 1 capsule by mouth as needed.   sucralfate (CARAFATE) 1 GM/10ML suspension Take by mouth.   No facility-administered encounter medications on file as of 11/10/2021.    Allergies as of 11/10/2021 - Review Complete 10/12/2021  Allergen Reaction Noted   Carbamazepine Palpitations 03/13/2014   Prochlorperazine edisylate  08/18/2011   Keflex [cephalexin] Rash and Other (See Comments) 07/16/2014   Augmentin [amoxicillin-pot clavulanate] Diarrhea    Prochlorperazine Other (See Comments) 11/19/2020   Epinephrine Palpitations and Other (See Comments) 08/18/2011    Past Medical History:  Diagnosis Date   Acute pain of right knee 03/07/2017   Anemia    Anorexia    Anxiety    Bitemporal hemianopsia    Borderline personality disorder ([place])    Bulimia    Depression    Hyperlipidemia    Ocular migraine    Osteoporosis    Pigmentary retinal dystrophy    Retinitis pigmentosa  TBI (traumatic brain injury) [facility])     Past Surgical History:  Procedure Laterality Date   CESAREAN SECTION      Family History  Problem Relation Age of Onset   Polymyalgia rheumatica Mother    Osteoporosis Mother    Cancer Father    Heart disease Father        CHF   Diabetes Sister        Obesity   Stomach cancer Maternal Grandfather    Colon cancer Neg Hx    Esophageal cancer Neg Hx    Rectal cancer Neg Hx     Social History   Socioeconomic  History   Marital status: Divorced    Spouse name: Not on file   Number of children: 1   Years of education: Not on file   Highest education level: Not on file  Occupational History   Occupation: Hotel manager education    Employer: [employer]   Occupation: Designer, fashion/clothing   Occupation: Contractor   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  Tobacco Use   Smoking status: Never   Smokeless tobacco: Never  Vaping Use   Vaping Use: Never used  Substance and Sexual Activity   Alcohol use: No   Drug use: No   Sexual activity: Not on file  Other Topics Concern   Not on file  Social History Narrative   ** Merged History Encounter **       Social Determinants of Health   Financial Resource Strain: Not on file  Food Insecurity: Not on file  Transportation Needs: Not on file  Physical Activity: Not on file  Stress: Not on file  Social Connections: Not on file  Intimate Partner Violence: Not on file      Review of systems: All other review of systems negative except as mentioned in the HPI.   Physical Exam: Vitals:   11/10/21 1042  BP: 112/82  Pulse: 79   Body mass index is 23.03 kg/m. Gen:      No acute distress HEENT:  sclera anicteric Abd:      soft, non-tender; no palpable masses, no distension Ext:    No edema Neuro: alert and oriented x 3 Psych: normal mood and affect  Data Reviewed:  Reviewed labs, radiology imaging, old records and pertinent past GI work up   Assessment and Plan/Recommendations:  66 year old very pleasant female with history of bulimia nervosa, generalized anxiety disorder with complaints of nausea, generalized abdominal discomfort and chronic constipation  Her symptoms are mostly likely related to eating disorder but will need to exclude any upper GI pathology Scheduled for EGD for further evaluation Small frequent meals DC Carafate, did not notice any improvement Use pantoprazole 40 mg  daily in the morning and Pepcid 40 mg daily in the evening as needed Antireflux measures Use FD guard 1 capsule up to 3 times daily as needed for dyspepsia symptoms    Nausea: Multifactorial, secondary to eating disorder, constipation and side effect of medications Advised patient to avoid using excessive amount of Zofran due to potential side effects   Chronic idiopathic constipation: Start Linzess 290 mcg daily Increase dietary fiber and water intake  Return after EGD  This visit required >40 minutes of patient care (this includes precharting, chart review, review of results, face-to-face time used for counseling as well as treatment plan and follow-up. The patient was provided an opportunity to ask questions and all were answered. The patient agreed with the  plan and demonstrated an understanding of the instructions.  Damaris Hippo , MD    CC: Harrison Mons, [place]

## 2021-11-11 ENCOUNTER — Ambulatory Visit (INDEPENDENT_AMBULATORY_CARE_PROVIDER_SITE_OTHER): Payer: Medicare Other | Admitting: Psychology

## 2021-11-11 DIAGNOSIS — F411 Generalized anxiety disorder: Secondary | ICD-10-CM

## 2021-11-11 NOTE — Progress Notes (Signed)
[name] is a 66 y.o. 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 (Status: maintained) -- No Description Entered  Excessive and/or unrealistic worry that is difficult to control occurring more days than not for at least 6 months about a number of events or activities. (Status: maintained) -- No Description Entered  Intense fear of gaining weight or becoming fat, even though underweight. (Status: maintained) -- No Description Entered  Lack of energy. (Status: maintained) -- No Description Entered  Persistent preoccupation with body image related to grossly inaccurate assessment of self as overweight. (Status: maintained) -- No Description Entered  Recurrent inappropriate compensatory behaviors in order to prevent weight gain, such as self-induced vomiting; misuse of laxatives, diuretics, enemas, or other medications; fasting; or excessive exercise. (Status: maintained) -- No Description Entered  Thoughts dominated by loss coupled with poor concentration, tearful spells, and confusion about the future. (Status: maintained) -- No Description Entered  Unresolved grief issues. (Status: maintained) -- No Description Entered  Medication Status compliance  Safety none  If Suicidal or Homicidal State Action Taken: unspecified  Current Risk: low Medications Klonapin (Dosage: '1mg'$ )  Lexapro (Dosage: '30mg'$ )  xyprexa (Dosage: 2.'5mg'$ /day)  Objectives Related Problem: Stabilize anxiety level while increasing ability to function on a daily basis. Description: Learn and implement problem-solving strategies for realistically addressing worries. Target Date: 2022-01-22 Frequency: Daily Modality:  individual Progress: 70%  Related Problem: Stabilize anxiety level while increasing ability to function on a daily basis. Description: Learn and implement calming skills to reduce overall anxiety and manage anxiety symptoms. Target Date: 2022-01-22 Frequency: Daily Modality: individual Progress: 50%  Related Problem: Stabilize anxiety level while increasing ability to function on a daily basis. Description: Describe situations, thoughts, feelings, and actions associated with anxieties and worries, their impact on functioning, and attempts to resolve them. Target Date: 2022-01-22 Frequency: Daily Modality: individual Progress: 50%  Client Response full compliance  Service Location Location, [address] [place], [place] [address]  Service Code cpt [phone]  Normalize/Reframe  Facilitate problem solving  Identified an insight  Identify automatic thoughts  Integration of affect  Distress tolerance skill  Self care activities  Journaling  Self-monitoring  Session Notes: Dx.: Depression, Anxiety and Eating Disorder Meds: Neurontin, Klonapin, Paxil, Zophran (nausea), Magnesium, Lybalbi, Praosin, Zyprexa 2.'5mg'$ .  Goals: Patient reports extreme levels of debilitating anxiety. She is seeking to reduce symptoms that interfere with day to day functioning. She wants to resolve her chronic fears of death and being abandoned. Goal date is 09-2020. Patient is having numerous medical symptoms and has episodes of fear that she is going to die. Revised goal date is 12-23. In addition, she is experiencing complicated grief related to the death of her mother. She reports feeling "disconnected" from her surroundings. Goal mostly achieved 09-2020. Will continue to address this issue with additional sessions. Goal 12-23.  Patient agrees to a video [employer]) session. She is at home and I am in my home office.  [name] says that she was sick throwing up all the way to [place]. She did not, however, get  sick on the way home. She says she was distracted with the other people on the way home and that helped her  not be anxious or sick. While at the conference, she went to a lot of sessions. She went to GI doc this week and she is scheduled for an endoscopy on Wednesday. She has not purged in 2 weeks and is carefully watching her food intake. Says she is eating clean right now and not binging. She said that she goes to see [name] once a week. He has not changed and she says the visits are difficult. She is scheduled to start the EMDR with [name] in latter part of October. We discussed preparing for that intervention.                                                                                 [name], PhD Time: 3:26Z-1:24P 51mnutes

## 2021-11-12 ENCOUNTER — Encounter: Payer: Self-pay | Admitting: Gastroenterology

## 2021-11-16 ENCOUNTER — Encounter: Payer: Self-pay | Admitting: Gastroenterology

## 2021-11-16 ENCOUNTER — Ambulatory Visit: Payer: Medicare Other | Admitting: Gastroenterology

## 2021-11-16 VITALS — BP 132/81 | HR 70 | Temp 98.0°F | Ht 63.0 in | Wt 130.0 lb

## 2021-11-16 DIAGNOSIS — R1013 Epigastric pain: Secondary | ICD-10-CM

## 2021-11-16 MED ORDER — SODIUM CHLORIDE 0.9 % IV SOLN: 500.0000 mL | Freq: Once | INTRAVENOUS | Status: DC

## 2021-11-16 NOTE — Progress Notes (Signed)
Pt's states no medical or surgical changes since previsit or office visit. 

## 2021-11-16 NOTE — Progress Notes (Signed)
Pt cancelled procedure the patient . Pt was scared of anesthesia and that she would not wake up . CRNA ,Junie Panning spoke with pt and explained anesthesia but pt cancelled .

## 2021-11-16 NOTE — Progress Notes (Deleted)
Vital signs checked by:DT  The medical and surgical history was reviewed and verified with the patient.  

## 2021-11-18 ENCOUNTER — Ambulatory Visit: Payer: Medicare Other | Admitting: Psychology

## 2021-11-25 ENCOUNTER — Ambulatory Visit (INDEPENDENT_AMBULATORY_CARE_PROVIDER_SITE_OTHER): Payer: Medicare Other | Admitting: Psychology

## 2021-11-25 DIAGNOSIS — F411 Generalized anxiety disorder: Secondary | ICD-10-CM

## 2021-11-25 NOTE — Progress Notes (Signed)
[name] is a 66 y.o. 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 (Status: maintained) -- No Description Entered  Excessive and/or unrealistic worry that is difficult to control occurring more days than not for at least 6 months about a number of events or activities. (Status: maintained) -- No Description Entered  Intense fear of gaining weight or becoming fat, even though underweight. (Status: maintained) -- No Description Entered  Lack of energy. (Status: maintained) -- No Description Entered  Persistent preoccupation with body image related to grossly inaccurate assessment of self as overweight. (Status: maintained) -- No Description Entered  Recurrent inappropriate compensatory behaviors in order to prevent weight gain, such as self-induced vomiting; misuse of laxatives, diuretics, enemas, or other medications; fasting; or excessive exercise. (Status: maintained) -- No Description Entered  Thoughts dominated by loss coupled with poor concentration, tearful spells, and confusion about the future. (Status: maintained) -- No Description Entered  Unresolved grief issues. (Status: maintained) -- No Description Entered  Medication Status compliance  Safety none  If Suicidal or Homicidal State Action Taken: unspecified  Current Risk: low Medications Klonapin (Dosage: '1mg'$ )  Lexapro (Dosage: '30mg'$ )  xyprexa (Dosage: 2.'5mg'$ /day)  Objectives Related Problem: Stabilize anxiety level while increasing ability to function on a daily basis. Description: Learn and implement problem-solving strategies for realistically addressing worries. Target Date:  2022-01-22 Frequency: Daily Modality: individual Progress: 70%  Related Problem: Stabilize anxiety level while increasing ability to function on a daily basis. Description: Learn and implement calming skills to reduce overall anxiety and manage anxiety symptoms. Target Date: 2022-01-22 Frequency: Daily Modality: individual Progress: 50%  Related Problem: Stabilize anxiety level while increasing ability to function on a daily basis. Description: Describe situations, thoughts, feelings, and actions associated with anxieties and worries, their impact on functioning, and attempts to resolve them. Target Date: 2022-01-22 Frequency: Daily Modality: individual Progress: 50%  Client Response full compliance  Service Location Location, [address] [place], [place] [address]  Service Code cpt [id number]  Normalize/Reframe  Facilitate problem solving  Identified an insight  Identify automatic thoughts  Integration of affect  Distress tolerance skill  Self care activities  Journaling  Self-monitoring  Session Notes: Dx.: Depression, Anxiety and Eating Disorder Meds: Neurontin, Klonapin, Paxil, Zophran (nausea), Magnesium, Lybalbi, Praosin, Zyprexa 2.'5mg'$ .  Goals: Patient reports extreme levels of debilitating anxiety. She is seeking to reduce symptoms that interfere with day to day functioning. She wants to resolve her chronic fears of death and being abandoned. Goal date is 09-2020. Patient is having numerous medical symptoms and has episodes of fear that she is going to die. Revised goal date is 12-23. In addition, she is experiencing complicated grief related to the death of her mother. She reports feeling "disconnected" from her surroundings. Goal mostly achieved 09-2020. Will continue to address this issue with additional sessions. Goal 12-23.  Patient agrees to a video [employer]) session. She is at home and I am in my home office.  [name] states she has already been to the gym and taken  the dogs out. She seems to be in a good frame of mind. She  went for her endoscopy and she "freaked out" and could not do it. She had gotten to point of getting IV, and spoke with the anesthesia doctor. She now regrets it because she is still having problems with her esophagus. Feels ashamed and hopes to get rescheduled after EMDR treatment.  She she has been feeling agitated and upset with everyone and does not know why. She feels she is "taking other people's inventory" and has no busy doing that. She is "mad" all the time and "every little thing". She thinks her anger is born out of jealousy and she therefore finds something to be mad at.  [name] says she has to get out of the apartment due to the dissociation, which is only at home. She feels it holds her back from being her old self. She heard from her daughter for the first time in months. [name] requested some old photos and information. [name] tried to correspond a few more times, but [name] did not respond. Talked about managing her anger and not acting out. Also, her plans for the EMDR treatment.                                                                                      [name], PhD Time: 5:36R-4:43X 83mnutes

## 2021-11-30 ENCOUNTER — Encounter: Payer: Self-pay | Admitting: Gastroenterology

## 2021-12-02 ENCOUNTER — Ambulatory Visit (HOSPITAL_COMMUNITY)
Admission: RE | Admit: 2021-12-02 | Discharge: 2021-12-02 | Disposition: A | Discharge status: Home / Self Care | Payer: Medicare Other | Source: Ambulatory Visit | Attending: Gastroenterology | Admitting: Gastroenterology

## 2021-12-02 ENCOUNTER — Ambulatory Visit: Payer: Medicare Other | Admitting: Psychology

## 2021-12-02 DIAGNOSIS — R1013 Epigastric pain: Secondary | ICD-10-CM | POA: Diagnosis present

## 2021-12-02 DIAGNOSIS — K219 Gastro-esophageal reflux disease without esophagitis: Secondary | ICD-10-CM | POA: Diagnosis present

## 2021-12-02 DIAGNOSIS — R112 Nausea with vomiting, unspecified: Secondary | ICD-10-CM | POA: Diagnosis present

## 2021-12-07 ENCOUNTER — Ambulatory Visit (INDEPENDENT_AMBULATORY_CARE_PROVIDER_SITE_OTHER): Payer: Medicare Other | Admitting: Psychology

## 2021-12-07 DIAGNOSIS — F509 Eating disorder, unspecified: Secondary | ICD-10-CM

## 2021-12-07 DIAGNOSIS — F411 Generalized anxiety disorder: Secondary | ICD-10-CM

## 2021-12-07 DIAGNOSIS — F4310 Post-traumatic stress disorder, unspecified: Secondary | ICD-10-CM | POA: Diagnosis not present
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
## 2021-12-09 ENCOUNTER — Ambulatory Visit: Payer: Medicare Other | Admitting: Psychology

## 2021-12-16 ENCOUNTER — Ambulatory Visit: Payer: Medicare Other | Admitting: Psychology

## 2021-12-23 ENCOUNTER — Ambulatory Visit (INDEPENDENT_AMBULATORY_CARE_PROVIDER_SITE_OTHER): Payer: Medicare Other | Admitting: Psychology

## 2021-12-23 ENCOUNTER — Ambulatory Visit: Payer: Medicare Other | Admitting: Psychology

## 2021-12-23 DIAGNOSIS — F509 Eating disorder, unspecified: Secondary | ICD-10-CM | POA: Diagnosis not present

## 2021-12-23 DIAGNOSIS — F603 Borderline personality disorder: Secondary | ICD-10-CM | POA: Diagnosis not present

## 2021-12-23 DIAGNOSIS — F411 Generalized anxiety disorder: Secondary | ICD-10-CM

## 2021-12-23 DIAGNOSIS — F4310 Post-traumatic stress disorder, unspecified: Secondary | ICD-10-CM

## 2021-12-23 NOTE — Progress Notes (Signed)
[place] Counselor/Therapist Progress Note  Patient ID: [name], MRN: [id number],    Date: 12/23/2021  Time Spent: 60 minutes  Treatment Type: Individual Therapy  Reported Symptoms: dissociation, panic attacks, anxiety  Mental Status Exam: Appearance:  Casual     Behavior: Attention-Seeking  Motor: Restlestness  Speech/Language:  Normal Rate  Affect: Blunt  Mood: anxious  Thought process: concrete  Thought content:   Questionable delusions  Sensory/Perceptual disturbances:   WNL  Orientation: oriented to person, place, time/date, and situation  Attention: Poor  Concentration: Fair  Memory: [place] of knowledge:  Good  Insight:   Poor  Judgment:  Poor  Impulse Control: Fair   Risk Assessment: Danger to Self:  No Self-injurious Behavior: No Danger to Others: No Duty to Warn:no Physical Aggression / Violence:No  Access to Firearms a concern: No  Gang Involvement:No   Subjective: The patient attended a face-to-face individual therapy session in the office today.  The patient reported that she was beginning to have a panic attack and we did some deep breathing and she seemed to be okay after that.  I also taught the patient some grounding techniques today in the office and recommended that she utilize these.  Identified that she needs to work with herself on mindfulness and meditation.  I asked the patient to commit to doing what she needs to do in therapy in order to be able to feel better.  Discussed further how she developed her lack of coping skills and security based on what she told me in relation to her parents.  I recommended the book, The Body Keeps the Score.  We will be working on International aid/development worker and getting the patient to know how to calm herself down for the next few sessions.  I explained to her that we would not be doing EMDR until she got herself in a better space in regard to doing mindfulness and meditation to help decrease her  dissociation.  Interventions: Cognitive Behavioral Therapy, Dialectical Behavioral Therapy, Solution-Oriented/Positive Psychology, Eye Movement Desensitization and Reprocessing (EMDR), and Insight-Oriented  Diagnosis:GAD (generalized anxiety disorder)  Eating disorder with ongoing treatment  PTSD (post-traumatic stress disorder)  Borderline personality disorder in adult [facility])  Plan: Treatment Plan Client Abilities/Strengths  Intelligent, kind, motivated  Client Treatment Preferences  Outpatient Trauma therapy  Client Statement of Needs  "I need some  help dealing with my anxiety and PTSD" Treatment Level  Outpatient Individual therapy  Symptoms  Describes a reliving of the event, particularly through dissociative flashbacks.:  (Status: maintained). Displays a significant decline in interest and engagement in activities.:  (Status: maintained). Displays significant psychological and/or physiological  distress resulting from internal and external clues that are reminiscent of the traumatic event.: (Status: maintained). Experiences disturbances in sleep.:   (Status: maintained). Experiences disturbing and persistent thoughts, images, and/or perceptions of the  traumatic event.: (Status: maintained). Experiences frequent nightmares.: No  Description Entered (Status: improved). Has been exposed to a traumatic event involving actual or  perceived threat of death or serious injury.:  (Status: maintained).  Hypervigilance (e.g., feeling constantly on edge, experiencing concentration difficulties, having trouble falling or staying asleep, exhibiting a general state of irritability).:  (Status:  maintained). Impairment in social, occupational, or other areas of functioning.:  (Status: maintained). Intentionally avoids activities, places, people, or objects (e.g., up-armored vehicles) that evoke memories of the event.:  (Status: maintained). Intentionally avoids  thoughts, feelings, or  discussions related to the traumatic event.:  (Status:  maintained). Reports difficulty  concentrating as well as feelings of guilt.:   (Status: maintained). Reports response of intense fear, helplessness, or horror to the traumatic event.:  (Status: maintained). Symptoms present more than one month.: (Status: maintained).   Problems Addressed  Anxiety, Posttraumatic Stress Disorder (PTSD), Posttraumatic Stress Disorder (PTSD), Anxiety,  Posttraumatic Stress Disorder (PTSD)  Goals 1. Enhance ability to effectively cope with the full variety of life's worries  and anxieties. Objective Identify and engage in pleasant activities on a daily basis. Target Date: 2022-12-24 Frequency: weekly Progress: 0 Modality: individual Objective Identify, challenge, and replace biased, fearful self-talk with positive, realistic, and empowering selftalk. Target Date: 2022-12-24 Frequency: weekly Progress: 0 Modality: individual Related Interventions 1. Assign the client a homework exercise in which he/she identifies fearful self-talk, identifies  biases in the self-talk, generates alternatives, and tests through behavioral experiments (or  assign "Negative Thoughts Trigger Negative Feelings" in the Adult Psychotherapy Homework  Planner by [facility]); review and reinforce success, providing corrective feedback toward  improvement. 2. Explore the client's schema and self-talk that mediate his/her fear response; assist him/her in  challenging the biases; replace the distorted messages with reality-based alternatives and  positive, realistic self-talk that will increase his/her self-confidence in coping with irrational  fears (see Cognitive Therapy of Anxiety Disorders by Alison Stalling).  2. No longer avoids persons, places, activities, and objects that are  reminiscent of the traumatic event. 3. No longer experiences intrusive event recollections, avoidance of event  reminders, intense arousal, or disinterest  in activities or  relationships. 4. Stabilize anxiety level while increasing ability to function on a daily  basis. 5. Thinks about or openly discusses the traumatic event with others  without experiencing psychological or physiological distress. Objective Participate in Cognitive Therapy to help identify, challenge, and replace biased, negative, and selfdefeating thoughts resulting from the trauma. Target Date: 2022-12-24 Frequency: weekly Progress: 0 Modality: individual Related Interventions 1. Using Cognitive Therapy techniques, explore the client's self-talk and beliefs about self, others,  and the future that are a consequence of the trauma (e.g., themes of safety, trust, power, control,  esteem, and intimacy); identify and challenge biases; assist him/her in generating appraisals that correct for the biases; test biased and alternatives predictions through behavioral experiments. Objective Participate in Eye Movement Desensitization and Reprocessing (EMDR) to reduce emotional distress  related to traumatic thoughts, feelings, and images. Target Date: 2022-12-24 Frequency: weekly Progress: 0 Modality: individual Related Interventions 1. Utilize Eye Movement Desensitization and Reprocessing (EMDR) to reduce the client's  emotional reactivity to the traumatic event and reduce PTSD symptoms.  Diagnosis  F43.10 (Posttraumatic stress disorder) - Open - [Signifier: n/a] Posttraumatic Stress  Disorder  Conditions For Discharge Achievement of treatment goals and objectives   Temima Kutsch G Ritchie Klee, LCSW

## 2021-12-30 ENCOUNTER — Ambulatory Visit (INDEPENDENT_AMBULATORY_CARE_PROVIDER_SITE_OTHER): Payer: Medicare Other | Admitting: Psychology

## 2021-12-30 ENCOUNTER — Ambulatory Visit: Payer: Medicare Other | Admitting: Psychology

## 2021-12-30 DIAGNOSIS — F603 Borderline personality disorder: Secondary | ICD-10-CM

## 2021-12-30 DIAGNOSIS — F4310 Post-traumatic stress disorder, unspecified: Secondary | ICD-10-CM | POA: Diagnosis not present

## 2021-12-30 DIAGNOSIS — F4322 Adjustment disorder with anxiety: Secondary | ICD-10-CM

## 2021-12-30 DIAGNOSIS — F411 Generalized anxiety disorder: Secondary | ICD-10-CM

## 2022-01-02 NOTE — Progress Notes (Signed)
[place] Counselor/Therapist Progress Note  Patient ID: [name], MRN: [id number],    Date: 12/30/2021  Time Spent: 60 minutes  Treatment Type: Individual Therapy  Reported Symptoms: dissociation, panic attacks, anxiety  Mental Status Exam: Appearance:  Casual     Behavior: Attention-Seeking  Motor: Restlestness  Speech/Language:  Normal Rate  Affect: Blunt  Mood: anxious  Thought process: concrete  Thought content:   Questionable delusions  Sensory/Perceptual disturbances:   WNL  Orientation: oriented to person, place, time/date, and situation  Attention: Poor  Concentration: Fair  Memory: [place] of knowledge:  Good  Insight:   Poor  Judgment:  Poor  Impulse Control: Fair   Risk Assessment: Danger to Self:  No Self-injurious Behavior: No Danger to Others: No Duty to Warn:no Physical Aggression / Violence:No  Access to Firearms a concern: No  Gang Involvement:No   Subjective: The patient attended a face-to-face individual therapy session in the office today.  The patient came in saying that she was dissociating really bad.  Assisted patient in using grounding techniques.  The patient reports that she is having trouble with anxiety and feeling that everything is not real.  We talked about her learning how to practice some sort of calming activity.  She says that she had difficulty doing this since our last session.  We talked about her using guided imagery to help her with this.  We discussed that it seems that she does not have any internal security and that the issue was that she has always utilized an external source of security such as her mother.  Will continue to work with patient on getting her grounded enough to do EMDR.  Interventions: Cognitive Behavioral Therapy, Dialectical Behavioral Therapy, Solution-Oriented/Positive Psychology, Eye Movement Desensitization and Reprocessing (EMDR), and Insight-Oriented  Diagnosis:Borderline  personality disorder in adult [facility])  PTSD (post-traumatic stress disorder)  GAD (generalized anxiety disorder)  Adjustment disorder with anxiety  Plan: Treatment Plan Client Abilities/Strengths  Intelligent, kind, motivated  Client Treatment Preferences  Outpatient Trauma therapy  Client Statement of Needs  "I need some  help dealing with my anxiety and PTSD" Treatment Level  Outpatient Individual therapy  Symptoms  Describes a reliving of the event, particularly through dissociative flashbacks.:  (Status: maintained). Displays a significant decline in interest and engagement in activities.:  (Status: maintained). Displays significant psychological and/or physiological  distress resulting from internal and external clues that are reminiscent of the traumatic event.: (Status: maintained). Experiences disturbances in sleep.:   (Status: maintained). Experiences disturbing and persistent thoughts, images, and/or perceptions of the  traumatic event.: (Status: maintained). Experiences frequent nightmares.: No  Description Entered (Status: improved). Has been exposed to a traumatic event involving actual or  perceived threat of death or serious injury.:  (Status: maintained).  Hypervigilance (e.g., feeling constantly on edge, experiencing concentration difficulties, having trouble falling or staying asleep, exhibiting a general state of irritability).:  (Status:  maintained). Impairment in social, occupational, or other areas of functioning.:  (Status: maintained). Intentionally avoids activities, places, people, or objects (e.g., up-armored vehicles) that evoke memories of the event.:  (Status: maintained). Intentionally avoids  thoughts, feelings, or discussions related to the traumatic event.:  (Status:  maintained). Reports difficulty concentrating as well as feelings of guilt.:   (Status: maintained). Reports response of intense fear, helplessness, or horror to the traumatic event.:   (Status: maintained). Symptoms present more than one month.: (Status: maintained).   Problems Addressed  Anxiety, Posttraumatic Stress Disorder (PTSD), Posttraumatic Stress Disorder (  PTSD), Anxiety,  Posttraumatic Stress Disorder (PTSD)  Goals 1. Enhance ability to effectively cope with the full variety of life's worries  and anxieties. Objective Identify and engage in pleasant activities on a daily basis. Target Date: 2022-12-24 Frequency: weekly Progress: 0 Modality: individual Objective Identify, challenge, and replace biased, fearful self-talk with positive, realistic, and empowering selftalk. Target Date: 2022-12-24 Frequency: weekly Progress: 0 Modality: individual Related Interventions 1. Assign the client a homework exercise in which he/she identifies fearful self-talk, identifies  biases in the self-talk, generates alternatives, and tests through behavioral experiments (or  assign "Negative Thoughts Trigger Negative Feelings" in the Adult Psychotherapy Homework  Planner by [facility]); review and reinforce success, providing corrective feedback toward  improvement. 2. Explore the client's schema and self-talk that mediate his/her fear response; assist him/her in  challenging the biases; replace the distorted messages with reality-based alternatives and  positive, realistic self-talk that will increase his/her self-confidence in coping with irrational  fears (see Cognitive Therapy of Anxiety Disorders by Alison Stalling).  2. No longer avoids persons, places, activities, and objects that are  reminiscent of the traumatic event. 3. No longer experiences intrusive event recollections, avoidance of event  reminders, intense arousal, or disinterest in activities or  relationships. 4. Stabilize anxiety level while increasing ability to function on a daily  basis. 5. Thinks about or openly discusses the traumatic event with others  without experiencing psychological or physiological  distress. Objective Participate in Cognitive Therapy to help identify, challenge, and replace biased, negative, and selfdefeating thoughts resulting from the trauma. Target Date: 2022-12-24 Frequency: weekly Progress: 0 Modality: individual Related Interventions 1. Using Cognitive Therapy techniques, explore the client's self-talk and beliefs about self, others,  and the future that are a consequence of the trauma (e.g., themes of safety, trust, power, control,  esteem, and intimacy); identify and challenge biases; assist him/her in generating appraisals that correct for the biases; test biased and alternatives predictions through behavioral experiments. Objective Participate in Eye Movement Desensitization and Reprocessing (EMDR) to reduce emotional distress  related to traumatic thoughts, feelings, and images. Target Date: 2022-12-24 Frequency: weekly Progress: 0 Modality: individual Related Interventions 1. Utilize Eye Movement Desensitization and Reprocessing (EMDR) to reduce the client's  emotional reactivity to the traumatic event and reduce PTSD symptoms.  Diagnosis  F43.10 (Posttraumatic stress disorder) - Open - [Signifier: n/a] Posttraumatic Stress  Disorder  Conditions For Discharge Achievement of treatment goals and objectives   Lucrecia Mcphearson G Nakea Gouger, LCSW

## 2022-01-04 ENCOUNTER — Ambulatory Visit (INDEPENDENT_AMBULATORY_CARE_PROVIDER_SITE_OTHER): Payer: Medicare Other | Admitting: Psychology

## 2022-01-04 DIAGNOSIS — F509 Eating disorder, unspecified: Secondary | ICD-10-CM | POA: Diagnosis not present

## 2022-01-04 DIAGNOSIS — F4310 Post-traumatic stress disorder, unspecified: Secondary | ICD-10-CM

## 2022-01-04 DIAGNOSIS — F603 Borderline personality disorder: Secondary | ICD-10-CM | POA: Diagnosis not present

## 2022-01-04 DIAGNOSIS — F411 Generalized anxiety disorder: Secondary | ICD-10-CM

## 2022-01-04 DIAGNOSIS — F4322 Adjustment disorder with anxiety: Secondary | ICD-10-CM | POA: Diagnosis not present

## 2022-01-04 NOTE — Progress Notes (Signed)
[place] Counselor/Therapist Progress Note  Patient ID: [name], MRN: [id number],    Date: 01/04/2022  Time Spent: 60 minutes  Treatment Type: Individual Therapy  Reported Symptoms: dissociation, panic attacks, anxiety  Mental Status Exam: Appearance:  Casual     Behavior: Attention-Seeking  Motor: Restlestness  Speech/Language:  Normal Rate  Affect: Blunt  Mood: anxious  Thought process: concrete  Thought content:   Questionable delusions  Sensory/Perceptual disturbances:   WNL  Orientation: oriented to person, place, time/date, and situation  Attention: Poor  Concentration: Fair  Memory: [place] of knowledge:  Good  Insight:   Poor  Judgment:  Poor  Impulse Control: Fair   Risk Assessment: Danger to Self:  No Self-injurious Behavior: No Danger to Others: No Duty to Warn:no Physical Aggression / Violence:No  Access to Firearms a concern: No  Gang Involvement:No   Subjective: The patient attended a face-to-face individual therapy session in the office today.  The patient reports that she had a really good day yesterday.  We started talking about what made yesterday a better day and it seems that she may have done a better job of trying to ground herself and stay positive.  The last session that we had we talked talked about her giving herself messages and I have been working very hard at her giving herself positive messages and talking herself through things so that she feels more secure within herself.  I explained to her a little more about where security comes from and we talked about the need for her to try to ground herself in her apartment.  She did report that she has done a little bit of a better job using guided meditation to help herself stay grounded at home and she has been able to do that a bit more.  I also explained to her about how we have different parts of ourselves that sometimes are reactive and proactive and we talked about  dissociation and anxiety being some of the reactive things that protect her wounded child Part.  Interventions: Cognitive Behavioral Therapy, Dialectical Behavioral Therapy, Solution-Oriented/Positive Psychology, Eye Movement Desensitization and Reprocessing (EMDR), and Insight-Oriented  Diagnosis:Borderline personality disorder in adult [facility])  Adjustment disorder with anxiety  PTSD (post-traumatic stress disorder)  Eating disorder with ongoing treatment  GAD (generalized anxiety disorder)  Plan: Treatment Plan Client Abilities/Strengths  Intelligent, kind, motivated  Client Treatment Preferences  Outpatient Trauma therapy  Client Statement of Needs  "I need some  help dealing with my anxiety and PTSD" Treatment Level  Outpatient Individual therapy  Symptoms  Describes a reliving of the event, particularly through dissociative flashbacks.:  (Status: maintained). Displays a significant decline in interest and engagement in activities.:  (Status: maintained). Displays significant psychological and/or physiological  distress resulting from internal and external clues that are reminiscent of the traumatic event.: (Status: maintained). Experiences disturbances in sleep.:   (Status: maintained). Experiences disturbing and persistent thoughts, images, and/or perceptions of the  traumatic event.: (Status: maintained). Experiences frequent nightmares.: No  Description Entered (Status: improved). Has been exposed to a traumatic event involving actual or  perceived threat of death or serious injury.:  (Status: maintained).  Hypervigilance (e.g., feeling constantly on edge, experiencing concentration difficulties, having trouble falling or staying asleep, exhibiting a general state of irritability).:  (Status:  maintained). Impairment in social, occupational, or other areas of functioning.:  (Status: maintained). Intentionally avoids activities, places, people, or objects (e.g., up-armored  vehicles) that evoke memories of the  event.:  (Status: maintained). Intentionally avoids  thoughts, feelings, or discussions related to the traumatic event.:  (Status:  maintained). Reports difficulty concentrating as well as feelings of guilt.:   (Status: maintained). Reports response of intense fear, helplessness, or horror to the traumatic event.:  (Status: maintained). Symptoms present more than one month.: (Status: maintained).   Problems Addressed  Anxiety, Posttraumatic Stress Disorder (PTSD), Posttraumatic Stress Disorder (PTSD), Anxiety,  Posttraumatic Stress Disorder (PTSD)  Goals 1. Enhance ability to effectively cope with the full variety of life's worries  and anxieties. Objective Identify and engage in pleasant activities on a daily basis. Target Date: 2022-12-24 Frequency: weekly Progress: 0 Modality: individual Objective Identify, challenge, and replace biased, fearful self-talk with positive, realistic, and empowering selftalk. Target Date: 2022-12-24 Frequency: weekly Progress: 0 Modality: individual Related Interventions 1. Assign the client a homework exercise in which he/she identifies fearful self-talk, identifies  biases in the self-talk, generates alternatives, and tests through behavioral experiments (or  assign "Negative Thoughts Trigger Negative Feelings" in the Adult Psychotherapy Homework  Planner by [facility]); review and reinforce success, providing corrective feedback toward  improvement. 2. Explore the client's schema and self-talk that mediate his/her fear response; assist him/her in  challenging the biases; replace the distorted messages with reality-based alternatives and  positive, realistic self-talk that will increase his/her self-confidence in coping with irrational  fears (see Cognitive Therapy of Anxiety Disorders by Alison Stalling).  2. No longer avoids persons, places, activities, and objects that are  reminiscent of the traumatic event. 3.  No longer experiences intrusive event recollections, avoidance of event  reminders, intense arousal, or disinterest in activities or  relationships. 4. Stabilize anxiety level while increasing ability to function on a daily  basis. 5. Thinks about or openly discusses the traumatic event with others  without experiencing psychological or physiological distress. Objective Participate in Cognitive Therapy to help identify, challenge, and replace biased, negative, and selfdefeating thoughts resulting from the trauma. Target Date: 2022-12-24 Frequency: weekly Progress: 0 Modality: individual Related Interventions 1. Using Cognitive Therapy techniques, explore the client's self-talk and beliefs about self, others,  and the future that are a consequence of the trauma (e.g., themes of safety, trust, power, control,  esteem, and intimacy); identify and challenge biases; assist him/her in generating appraisals that correct for the biases; test biased and alternatives predictions through behavioral experiments. Objective Participate in Eye Movement Desensitization and Reprocessing (EMDR) to reduce emotional distress  related to traumatic thoughts, feelings, and images. Target Date: 2022-12-24 Frequency: weekly Progress: 0 Modality: individual Related Interventions 1. Utilize Eye Movement Desensitization and Reprocessing (EMDR) to reduce the client's  emotional reactivity to the traumatic event and reduce PTSD symptoms.  Diagnosis  F43.10 (Posttraumatic stress disorder) - Open - [Signifier: n/a] Posttraumatic Stress  Disorder  Conditions For Discharge Achievement of treatment goals and objectives   Felisa Zechman G Rita Vialpando, LCSW

## 2022-01-06 ENCOUNTER — Ambulatory Visit: Payer: Medicare Other | Admitting: Psychology

## 2022-01-08 NOTE — Progress Notes (Deleted)
     01/08/2022 [name] [id number] [birth year]   Chief Complaint:  History of Present Illness: [name] is a 66 year old female with a past medical history of anxiety, bulimia nervosa, idiopathic neuropathy, constipation and colon polyps. She was last seen in office by Dr. Silverio Decamp 11/10/2021 due to having generalized abdominal pain, nausea without vomiting and constipation. She was started on Linzess 248mg QD. An EGD was ordered but not scheduled.   Labs 12/26/2021: Vitamin D 11. Normal CBC and CMP.  RUQ sono 12/02/2021: FINDINGS: Gallbladder: No gallstones or wall thickening visualized. No sonographic Murphy sign noted by sonographer.   Common bile duct: Diameter: 2 mm   Liver: No focal lesion identified. Normal parenchymal echogenicity. Portal vein is patent on color Doppler imaging with normal direction of blood flow towards the liver.   IMPRESSION: No findings to explain abdominal pain.    Upper GI series 03/07/18: Mild esophageal dysmotility otherwise negative exam   Cologaurd 2020: Negative   Colonscopy 10/12/21 - Two 3 to 5 mm polyps in the sigmoid colon and in the ascending colon, removed with a cold snare. Resected and retrieved. - Non-bleeding external and internal hemorrhoids. - 7 year recall colonoscopy  - TUBULAR ADENOMA(S) - NEGATIVE FOR HIGH-GRADE DYSPLASIA OR MALIGNANCY  Past Medical History:  Diagnosis Date   Acute pain of right knee 03/07/2017   Anemia    Anorexia    Anxiety    Bitemporal hemianopsia    Borderline personality disorder ([place]    Bulimia    Depression    Hyperlipidemia    Ocular migraine    Osteoporosis    Pigmentary retinal dystrophy    Retinitis pigmentosa    TBI (traumatic brain injury) ([place]    Past Surgical History:  Procedure Laterality Date   CESAREAN SECTION         Current Medications, Allergies, Past Medical History, Past Surgical History, Family History and Social History were reviewed in  [employer].   Review of Systems:   Constitutional: Negative for fever, sweats, chills or weight loss.  Respiratory: Negative for shortness of breath.   Cardiovascular: Negative for chest pain, palpitations and leg swelling.  Gastrointestinal: See HPI.  Musculoskeletal: Negative for back pain or muscle aches.  Neurological: Negative for dizziness, headaches or paresthesias.    Physical Exam: LMP 02/18/2004  General: Well developed, w   ***female in no acute distress. Head: Normocephalic and atraumatic. Eyes: No scleral icterus. Conjunctiva pink . Ears: Normal auditory acuity. Mouth: Dentition intact. No ulcers or lesions.  Lungs: Clear throughout to auscultation. Heart: Regular rate and rhythm, no murmur. Abdomen: Soft, nontender and nondistended. No masses or hepatomegaly. Normal bowel sounds x 4 quadrants.  Rectal: *** Musculoskeletal: Symmetrical with no gross deformities. Extremities: No edema. Neurological: Alert oriented x 4. No focal deficits.  Psychological: Alert and cooperative. Normal mood and affect  Assessment and Recommendations: ***

## 2022-01-09 ENCOUNTER — Ambulatory Visit: Payer: Medicare Other | Admitting: Nurse Practitioner

## 2022-01-11 ENCOUNTER — Ambulatory Visit (INDEPENDENT_AMBULATORY_CARE_PROVIDER_SITE_OTHER): Payer: Medicare Other | Admitting: Psychology

## 2022-01-11 DIAGNOSIS — F411 Generalized anxiety disorder: Secondary | ICD-10-CM

## 2022-01-11 DIAGNOSIS — F509 Eating disorder, unspecified: Secondary | ICD-10-CM

## 2022-01-11 DIAGNOSIS — F4310 Post-traumatic stress disorder, unspecified: Secondary | ICD-10-CM | POA: Diagnosis not present

## 2022-01-11 DIAGNOSIS — F603 Borderline personality disorder: Secondary | ICD-10-CM | POA: Diagnosis not present

## 2022-01-11 NOTE — Progress Notes (Signed)
[place] Counselor/Therapist Progress Note  Patient ID: [name], MRN: [id number],    Date: 01/11/2022  Time Spent: 60 minutes  Treatment Type: Individual Therapy  Reported Symptoms: dissociation, panic attacks, anxiety  Mental Status Exam: Appearance:  Casual     Behavior: Attention-Seeking  Motor: Restlestness  Speech/Language:  Normal Rate  Affect: Blunt  Mood: pleasant  Thought process: concrete  Thought content:   Questionable delusions  Sensory/Perceptual disturbances:   WNL  Orientation: oriented to person, place, time/date, and situation  Attention: Poor  Concentration: Fair  Memory: [place] of knowledge:  Good  Insight:   Poor  Judgment:  Poor  Impulse Control: Fair   Risk Assessment: Danger to Self:  No Self-injurious Behavior: No Danger to Others: No Duty to Warn:no Physical Aggression / Violence:No  Access to Firearms a concern: No  Gang Involvement:No   Subjective: The patient attended a face-to-face individual therapy session in the office today.  The patient was pleasant and cooperative today.  She brought in her knitting which seem to be grounding for her.  I recommended that she bring that the next time that she comes.  The patient did not mention feeling anxious or dissociating at all today.  We talked more about how to look at some of the things that she is been thinking in a different way.  The patient reports that she went to Thanksgiving at her female friend's family's house and she felt like an outsider.  I reframed this for her and explained to her that at least she knows what the situation is and that she is doing what she needs to do for him.  The patient has some confusion about getting validation for what she is doing and I told her that I wanted her to validate herself for the things she is doing.  The patient reports that she has some appointments scheduled with the therapist that refer her for EMDR and I told her that  she could not see him and see me at the same time.  I recommended that she call and cancel those appointments.  I did give her the option of staying with her other therapist or doing the EMDR and then going back.  I also gave her the option of staying in therapy with me.  The patient asked if I could cancel the appointments and I told her no that I wanted her to do that.  Part of the reason I want her to do that is so that she will not transfer her dependency to me. Interventions: Cognitive Behavioral Therapy, Dialectical Behavioral Therapy, Solution-Oriented/Positive Psychology, Eye Movement Desensitization and Reprocessing (EMDR), and Insight-Oriented  Diagnosis:Borderline personality disorder in adult [facility])  Eating disorder with ongoing treatment  GAD (generalized anxiety disorder)  PTSD (post-traumatic stress disorder)  Plan: Treatment Plan Client Abilities/Strengths  Intelligent, kind, motivated  Client Treatment Preferences  Outpatient Trauma therapy  Client Statement of Needs  "I need some  help dealing with my anxiety and PTSD" Treatment Level  Outpatient Individual therapy  Symptoms  Describes a reliving of the event, particularly through dissociative flashbacks.:  (Status: maintained). Displays a significant decline in interest and engagement in activities.:  (Status: maintained). Displays significant psychological and/or physiological  distress resulting from internal and external clues that are reminiscent of the traumatic event.: (Status: maintained). Experiences disturbances in sleep.:   (Status: maintained). Experiences disturbing and persistent thoughts, images, and/or perceptions of the  traumatic event.: (Status: maintained). Experiences frequent nightmares.:  No  Description Entered (Status: improved). Has been exposed to a traumatic event involving actual or  perceived threat of death or serious injury.:  (Status: maintained).  Hypervigilance (e.g., feeling constantly  on edge, experiencing concentration difficulties, having trouble falling or staying asleep, exhibiting a general state of irritability).:  (Status:  maintained). Impairment in social, occupational, or other areas of functioning.:  (Status: maintained). Intentionally avoids activities, places, people, or objects (e.g., up-armored vehicles) that evoke memories of the event.:  (Status: maintained). Intentionally avoids  thoughts, feelings, or discussions related to the traumatic event.:  (Status:  maintained). Reports difficulty concentrating as well as feelings of guilt.:   (Status: maintained). Reports response of intense fear, helplessness, or horror to the traumatic event.:  (Status: maintained). Symptoms present more than one month.: (Status: maintained).   Problems Addressed  Anxiety, Posttraumatic Stress Disorder (PTSD), Posttraumatic Stress Disorder (PTSD), Anxiety,  Posttraumatic Stress Disorder (PTSD)  Goals 1. Enhance ability to effectively cope with the full variety of life's worries  and anxieties. Objective Identify and engage in pleasant activities on a daily basis. Target Date: 2022-12-24 Frequency: weekly Progress: 0 Modality: individual Objective Identify, challenge, and replace biased, fearful self-talk with positive, realistic, and empowering selftalk. Target Date: 2022-12-24 Frequency: weekly Progress: 0 Modality: individual Related Interventions 1. Assign the client a homework exercise in which he/she identifies fearful self-talk, identifies  biases in the self-talk, generates alternatives, and tests through behavioral experiments (or  assign "Negative Thoughts Trigger Negative Feelings" in the Adult Psychotherapy Homework  Planner by [facility]); review and reinforce success, providing corrective feedback toward  improvement. 2. Explore the client's schema and self-talk that mediate his/her fear response; assist him/her in  challenging the biases; replace the distorted  messages with reality-based alternatives and  positive, realistic self-talk that will increase his/her self-confidence in coping with irrational  fears (see Cognitive Therapy of Anxiety Disorders by Alison Stalling).  2. No longer avoids persons, places, activities, and objects that are  reminiscent of the traumatic event. 3. No longer experiences intrusive event recollections, avoidance of event  reminders, intense arousal, or disinterest in activities or  relationships. 4. Stabilize anxiety level while increasing ability to function on a daily  basis. 5. Thinks about or openly discusses the traumatic event with others  without experiencing psychological or physiological distress. Objective Participate in Cognitive Therapy to help identify, challenge, and replace biased, negative, and selfdefeating thoughts resulting from the trauma. Target Date: 2022-12-24 Frequency: weekly Progress: 0 Modality: individual Related Interventions 1. Using Cognitive Therapy techniques, explore the client's self-talk and beliefs about self, others,  and the future that are a consequence of the trauma (e.g., themes of safety, trust, power, control,  esteem, and intimacy); identify and challenge biases; assist him/her in generating appraisals that correct for the biases; test biased and alternatives predictions through behavioral experiments. Objective Participate in Eye Movement Desensitization and Reprocessing (EMDR) to reduce emotional distress  related to traumatic thoughts, feelings, and images. Target Date: 2022-12-24 Frequency: weekly Progress: 0 Modality: individual Related Interventions 1. Utilize Eye Movement Desensitization and Reprocessing (EMDR) to reduce the client's  emotional reactivity to the traumatic event and reduce PTSD symptoms.  Diagnosis  F43.10 (Posttraumatic stress disorder) - Open - [Signifier: n/a] Posttraumatic Stress  Disorder  Conditions For Discharge Achievement of  treatment goals and objectives   Nevaeh Korte G Asaf Elmquist, LCSW

## 2022-01-13 ENCOUNTER — Ambulatory Visit: Payer: Medicare Other | Admitting: Psychology

## 2022-01-18 ENCOUNTER — Ambulatory Visit (INDEPENDENT_AMBULATORY_CARE_PROVIDER_SITE_OTHER): Payer: Medicare Other | Admitting: Psychology

## 2022-01-18 DIAGNOSIS — F603 Borderline personality disorder: Secondary | ICD-10-CM | POA: Diagnosis not present

## 2022-01-18 DIAGNOSIS — F411 Generalized anxiety disorder: Secondary | ICD-10-CM

## 2022-01-18 DIAGNOSIS — F509 Eating disorder, unspecified: Secondary | ICD-10-CM | POA: Diagnosis not present

## 2022-01-18 DIAGNOSIS — F4310 Post-traumatic stress disorder, unspecified: Secondary | ICD-10-CM | POA: Diagnosis not present

## 2022-01-19 ENCOUNTER — Ambulatory Visit (INDEPENDENT_AMBULATORY_CARE_PROVIDER_SITE_OTHER): Payer: Medicare Other | Admitting: Nurse Practitioner

## 2022-01-19 ENCOUNTER — Encounter: Payer: Self-pay | Admitting: Nurse Practitioner

## 2022-01-19 VITALS — BP 118/62 | HR 63 | Ht 63.0 in | Wt 130.0 lb

## 2022-01-19 DIAGNOSIS — K5900 Constipation, unspecified: Secondary | ICD-10-CM

## 2022-01-19 DIAGNOSIS — Z8659 Personal history of other mental and behavioral disorders: Secondary | ICD-10-CM

## 2022-01-19 DIAGNOSIS — R112 Nausea with vomiting, unspecified: Secondary | ICD-10-CM
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
## 2022-01-19 NOTE — Patient Instructions (Addendum)
Continue Linzess 290 mcg 1 by mouth daily to be taken 30 minutes before breakfast.  Fdguard- 2 by mouth twice daily as needed for nausea.  Do not take Ex lax or any other over the counter laxatives.  You have been scheduled for an Upper GI Series at [facility]. Your appointment is on 01/27/22 at 10:00 am. Please arrive 15 minutes prior to your test for registration. Make sure not to eat or drink anything after midnight on the night before your test. If you need to reschedule, please call radiology at [phone]. ________________________________________________________________ An upper GI series uses x rays to help diagnose problems of the upper GI tract, which includes the esophagus, stomach, and duodenum. The duodenum is the first part of the small intestine. An upper GI series is conducted by a radiology technologist or a radiologist--a doctor who specializes in x-ray imaging--at a hospital or outpatient center. While sitting or standing in front of an x-ray machine, the patient drinks barium liquid, which is often white and has a chalky consistency and taste. The barium liquid coats the lining of the upper GI tract and makes signs of disease show up more clearly on x rays. X-ray video, called fluoroscopy, is used to view the barium liquid moving through the esophagus, stomach, and duodenum. Additional x rays and fluoroscopy are performed while the patient lies on an x-ray table. To fully coat the upper GI tract with barium liquid, the technologist or radiologist may press on the abdomen or ask the patient to change position. Patients hold still in various positions, allowing the technologist or radiologist to take x rays of the upper GI tract at different angles. If a technologist conducts the upper GI series, a radiologist will later examine the images to look for problems.  This test typically takes about 1 hour to  complete. __________________________________________________________________
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
## 2022-01-20 ENCOUNTER — Ambulatory Visit: Payer: Medicare Other | Admitting: Psychology

## 2022-01-27 ENCOUNTER — Ambulatory Visit (HOSPITAL_COMMUNITY)
Admission: RE | Admit: 2022-01-27 | Discharge: 2022-01-27 | Disposition: A | Discharge status: Home / Self Care | Payer: Medicare Other | Source: Ambulatory Visit | Attending: Nurse Practitioner | Admitting: Nurse Practitioner

## 2022-01-27 ENCOUNTER — Ambulatory Visit: Payer: Medicare Other | Admitting: Psychology

## 2022-01-27 ENCOUNTER — Ambulatory Visit (INDEPENDENT_AMBULATORY_CARE_PROVIDER_SITE_OTHER): Payer: Medicare Other | Admitting: Psychology

## 2022-01-27 DIAGNOSIS — F509 Eating disorder, unspecified: Secondary | ICD-10-CM

## 2022-01-27 DIAGNOSIS — F603 Borderline personality disorder: Secondary | ICD-10-CM | POA: Diagnosis not present

## 2022-01-27 DIAGNOSIS — F411 Generalized anxiety disorder: Secondary | ICD-10-CM

## 2022-01-27 DIAGNOSIS — F4321 Adjustment disorder with depressed mood: Secondary | ICD-10-CM

## 2022-01-27 DIAGNOSIS — F4310 Post-traumatic stress disorder, unspecified: Secondary | ICD-10-CM | POA: Diagnosis not present

## 2022-01-27 DIAGNOSIS — R112 Nausea with vomiting, unspecified: Secondary | ICD-10-CM

## 2022-01-30 NOTE — Progress Notes (Signed)
[place] Counselor/Therapist Progress Note  Patient ID: Sharlena Kristensen, MRN: [id number],    Date: 01/27/2022  Time Spent: 60 minutes  Treatment Type: Individual Therapy  Reported Symptoms: dissociation, panic attacks, anxiety  Mental Status Exam: Appearance:  Casual     Behavior: Attention-Seeking  Motor: Restlestness  Speech/Language:  Normal Rate  Affect: Blunt  Mood: pleasant  Thought process: concrete  Thought content:   Questionable delusions  Sensory/Perceptual disturbances:   WNL  Orientation: oriented to person, place, time/date, and situation  Attention: Poor  Concentration: Fair  Memory: [place] of knowledge:  Good  Insight:   Poor  Judgment:  Poor  Impulse Control: Fair   Risk Assessment: Danger to Self:  No Self-injurious Behavior: No Danger to Others: No Duty to Warn:no Physical Aggression / Violence:No  Access to Firearms a concern: No  Gang Involvement:No   Subjective: The patient attended a face-to-face individual therapy session in the office today.    The patient presents as pleasant and cooperative.  The patient reports that she did go to her GI doctor and they were able to do some test and there that does not seem to be any problems with her digestive system.  I explained to her that it is possible that she could have caused some problems with her binging and purging and laxative use.  We talked about her grounding herself in her home and she seems to be doing some good exercises to help her with that.  I encouraged her to continue to label things in her home as hers and remember where she got them.  We also talked about her eating disorder as being something that she glorifies to some degree.  I recommended that we talk about it some more as I believe her perspective is not healthy.  The patient views herself as overweight currently and she periodically will have issues with taking too many laxatives.  We still cannot do the EMDR  until the patient is more grounded and has a better handle on her anxiety and reality.  Interventions: Cognitive Behavioral Therapy, Dialectical Behavioral Therapy, Solution-Oriented/Positive Psychology, Eye Movement Desensitization and Reprocessing (EMDR), and Insight-Oriented  Diagnosis:Borderline personality disorder in adult [facility])  Eating disorder with ongoing treatment  PTSD (post-traumatic stress disorder)  GAD (generalized anxiety disorder)  Adjustment disorder with depressed mood  Plan: Treatment Plan Client Abilities/Strengths  Intelligent, kind, motivated  Client Treatment Preferences  Outpatient Trauma therapy  Client Statement of Needs  "I need some  help dealing with my anxiety and PTSD" Treatment Level  Outpatient Individual therapy  Symptoms  Describes a reliving of the event, particularly through dissociative flashbacks.:  (Status: maintained). Displays a significant decline in interest and engagement in activities.:  (Status: maintained). Displays significant psychological and/or physiological  distress resulting from internal and external clues that are reminiscent of the traumatic event.: (Status: maintained). Experiences disturbances in sleep.:   (Status: maintained). Experiences disturbing and persistent thoughts, images, and/or perceptions of the  traumatic event.: (Status: maintained). Experiences frequent nightmares.: No  Description Entered (Status: improved). Has been exposed to a traumatic event involving actual or  perceived threat of death or serious injury.:  (Status: maintained).  Hypervigilance (e.g., feeling constantly on edge, experiencing concentration difficulties, having trouble falling or staying asleep, exhibiting a general state of irritability).:  (Status:  maintained). Impairment in social, occupational, or other areas of functioning.:  (Status: maintained). Intentionally avoids activities, places, people, or objects (e.g., up-armored  vehicles) that evoke memories of the  event.:  (Status: maintained). Intentionally avoids  thoughts, feelings, or discussions related to the traumatic event.:  (Status:  maintained). Reports difficulty concentrating as well as feelings of guilt.:   (Status: maintained). Reports response of intense fear, helplessness, or horror to the traumatic event.:  (Status: maintained). Symptoms present more than one month.: (Status: maintained).   Problems Addressed  Anxiety, Posttraumatic Stress Disorder (PTSD), Posttraumatic Stress Disorder (PTSD), Anxiety,  Posttraumatic Stress Disorder (PTSD)  Goals 1. Enhance ability to effectively cope with the full variety of life's worries  and anxieties. Objective Identify and engage in pleasant activities on a daily basis. Target Date: 2022-12-24 Frequency: weekly Progress: 0 Modality: individual Objective Identify, challenge, and replace biased, fearful self-talk with positive, realistic, and empowering selftalk. Target Date: 2022-12-24 Frequency: weekly Progress: 0 Modality: individual Related Interventions 1. Assign the client a homework exercise in which he/she identifies fearful self-talk, identifies  biases in the self-talk, generates alternatives, and tests through behavioral experiments (or  assign "Negative Thoughts Trigger Negative Feelings" in the Adult Psychotherapy Homework  Planner by [facility]); review and reinforce success, providing corrective feedback toward  improvement. 2. Explore the client's schema and self-talk that mediate his/her fear response; assist him/her in  challenging the biases; replace the distorted messages with reality-based alternatives and  positive, realistic self-talk that will increase his/her self-confidence in coping with irrational  fears (see Cognitive Therapy of Anxiety Disorders by Alison Stalling).  2. No longer avoids persons, places, activities, and objects that are  reminiscent of the traumatic event. 3.  No longer experiences intrusive event recollections, avoidance of event  reminders, intense arousal, or disinterest in activities or  relationships. 4. Stabilize anxiety level while increasing ability to function on a daily  basis. 5. Thinks about or openly discusses the traumatic event with others  without experiencing psychological or physiological distress. Objective Participate in Cognitive Therapy to help identify, challenge, and replace biased, negative, and selfdefeating thoughts resulting from the trauma. Target Date: 2022-12-24 Frequency: weekly Progress: 0 Modality: individual Related Interventions 1. Using Cognitive Therapy techniques, explore the client's self-talk and beliefs about self, others,  and the future that are a consequence of the trauma (e.g., themes of safety, trust, power, control,  esteem, and intimacy); identify and challenge biases; assist him/her in generating appraisals that correct for the biases; test biased and alternatives predictions through behavioral experiments. Objective Participate in Eye Movement Desensitization and Reprocessing (EMDR) to reduce emotional distress  related to traumatic thoughts, feelings, and images. Target Date: 2022-12-24 Frequency: weekly Progress: 0 Modality: individual Related Interventions 1. Utilize Eye Movement Desensitization and Reprocessing (EMDR) to reduce the client's  emotional reactivity to the traumatic event and reduce PTSD symptoms.  Diagnosis  F43.10 (Posttraumatic stress disorder) - Open - [Signifier: n/a] Posttraumatic Stress  Disorder  Conditions For Discharge Achievement of treatment goals and objectives   Haniya Fern G Kimo Bancroft, LCSW

## 2022-02-01 ENCOUNTER — Ambulatory Visit: Payer: Medicare Other | Admitting: Gastroenterology

## 2022-02-03 ENCOUNTER — Ambulatory Visit: Payer: Medicare Other | Admitting: Psychology

## 2022-02-10 ENCOUNTER — Ambulatory Visit: Payer: Medicare Other | Admitting: Psychology

## 2022-02-15 ENCOUNTER — Encounter (HOSPITAL_BASED_OUTPATIENT_CLINIC_OR_DEPARTMENT_OTHER): Payer: Self-pay

## 2022-02-15 ENCOUNTER — Emergency Department (HOSPITAL_BASED_OUTPATIENT_CLINIC_OR_DEPARTMENT_OTHER)
Admission: EM | Admit: 2022-02-15 | Discharge: 2022-02-15 | Discharge status: Against Medical Advice | Payer: Medicare Other | Attending: Emergency Medicine | Admitting: Emergency Medicine

## 2022-02-15 ENCOUNTER — Other Ambulatory Visit: Payer: Self-pay

## 2022-02-15 ENCOUNTER — Ambulatory Visit (INDEPENDENT_AMBULATORY_CARE_PROVIDER_SITE_OTHER): Payer: Medicare Other | Admitting: Psychology

## 2022-02-15 DIAGNOSIS — R1012 Left upper quadrant pain: Secondary | ICD-10-CM | POA: Insufficient documentation

## 2022-02-15 DIAGNOSIS — F411 Generalized anxiety disorder: Secondary | ICD-10-CM

## 2022-02-15 DIAGNOSIS — F4310 Post-traumatic stress disorder, unspecified: Secondary | ICD-10-CM

## 2022-02-15 DIAGNOSIS — Z5321 Procedure and treatment not carried out due to patient leaving prior to being seen by health care provider: Secondary | ICD-10-CM | POA: Insufficient documentation

## 2022-02-15 DIAGNOSIS — F603 Borderline personality disorder: Secondary | ICD-10-CM

## 2022-02-15 DIAGNOSIS — F509 Eating disorder, unspecified: Secondary | ICD-10-CM | POA: Diagnosis not present

## 2022-02-15 LAB — URINALYSIS, ROUTINE W REFLEX MICROSCOPIC
Bilirubin Urine: NEGATIVE
Glucose, UA: NEGATIVE mg/dL
Hgb urine dipstick: NEGATIVE
Ketones, ur: NEGATIVE mg/dL
Leukocytes,Ua: NEGATIVE
Nitrite: NEGATIVE
Protein, ur: NEGATIVE mg/dL
Specific Gravity, Urine: 1.023 (ref 1.005–1.030)
pH: 5.5 (ref 5.0–8.0)

## 2022-02-15 LAB — CBC
HCT: 38.5 % (ref 36.0–46.0)
Hemoglobin: 12.7 g/dL (ref 12.0–15.0)
MCH: 28.8 pg (ref 26.0–34.0)
MCHC: 33 g/dL (ref 30.0–36.0)
MCV: 87.3 fL (ref 80.0–100.0)
Platelets: 225 10*3/uL (ref 150–400)
RBC: 4.41 MIL/uL (ref 3.87–5.11)
RDW: 12.3 % (ref 11.5–15.5)
WBC: 5.9 10*3/uL (ref 4.0–10.5)
nRBC: 0 % (ref 0.0–0.2)

## 2022-02-15 LAB — LIPASE, BLOOD: Lipase: 27 U/L (ref 11–51)

## 2022-02-15 LAB — COMPREHENSIVE METABOLIC PANEL
ALT: 15 U/L (ref 0–44)
AST: 18 U/L (ref 15–41)
Albumin: 4.3 g/dL (ref 3.5–5.0)
Alkaline Phosphatase: 54 U/L (ref 38–126)
Anion gap: 9 (ref 5–15)
BUN: 15 mg/dL (ref 8–23)
CO2: 29 mmol/L (ref 22–32)
Calcium: 9.3 mg/dL (ref 8.9–10.3)
Chloride: 101 mmol/L (ref 98–111)
Creatinine, Ser: 0.79 mg/dL (ref 0.44–1.00)
GFR, Estimated: >60 mL/min (ref >60)
Glucose, Bld: 97 mg/dL (ref 70–99)
Potassium: 3.7 mmol/L (ref 3.5–5.1)
Sodium: 139 mmol/L (ref 135–145)
Total Bilirubin: 0.4 mg/dL (ref 0.3–1.2)
Total Protein: 7.1 g/dL (ref 6.5–8.1)

## 2022-02-15 NOTE — ED Notes (Signed)
Pt found walking in hallway. Tired of waiting for provider and wants to eat. Pt is told results are back but it is up to the provider to interpret them and report them. Encouraged to stay and see provider. Pt refuses to return to room for vitals/paperwork. Walks to lobby with family.

## 2022-02-15 NOTE — Telephone Encounter (Signed)
Pt states that she has been having ongoing left abdominal pain during the day, goes away at night but when she awake its there again. Pt states that she has been having bloating and belching also: Last BM yesterday. Pt states that when she awakes in the AM typically there is a tingling sensations in her arms: Pt had recent office visit., Barium swallow, [place], Colonoscopy: Pt was notified that Carl Best NP was out of the office the rest of this week so it will be next week before we contact her: Please advise:

## 2022-02-15 NOTE — ED Provider Notes (Signed)
[facility]   Charlesetta Shanks, MD 02/15/22 2013

## 2022-02-15 NOTE — Progress Notes (Signed)
[place] Counselor/Therapist Progress Note  Patient ID: [name], MRN: [id number],    Date: 02/15/2022  Time Spent: 60 minutes  Treatment Type: Individual Therapy  Reported Symptoms: dissociation, panic attacks, anxiety  Mental Status Exam: Appearance:  Casual     Behavior: Attention-Seeking  Motor: Restlestness  Speech/Language:  Normal Rate  Affect: Blunt  Mood: pleasant  Thought process: concrete  Thought content:   Questionable delusions  Sensory/Perceptual disturbances:   WNL  Orientation: oriented to person, place, time/date, and situation  Attention: Poor  Concentration: Fair  Memory: [place] of knowledge:  Good  Insight:   Poor  Judgment:  Poor  Impulse Control: Fair   Risk Assessment: Danger to Self:  No Self-injurious Behavior: No Danger to Others: No Duty to Warn:no Physical Aggression / Violence:No  Access to Firearms a concern: No  Gang Involvement:No   Subjective: The patient attended a face-to-face individual therapy session in the office today.    The patient presents as anxious today.  The patient reports that she is going to the emergency department after our session today because she is having excruciating pain in her left side.  I encouraged her to go and get checked out however I wonder how much of this is related to her anxiety.  The patient has an appointment with [facility] next week and I am hoping they will be able to prescribe some good medicine to help her with her anxiety next week.  I encouraged the patient to give herself those positive messages so she can handle things and pay attention to her thinking.  She is somewhat resistant to taking instructions to think a different way and focuses only on her negative symptoms.  I gave her some positive feedback and explained to her the messages she needs to be giving herself. Interventions: Cognitive Behavioral Therapy, Dialectical Behavioral Therapy, Solution-Oriented/Positive  Psychology, Eye Movement Desensitization and Reprocessing (EMDR), and Insight-Oriented  Diagnosis:Borderline personality disorder in adult [facility])  Eating disorder with ongoing treatment  PTSD (post-traumatic stress disorder)  GAD (generalized anxiety disorder)  Plan: Treatment Plan Client Abilities/Strengths  Intelligent, kind, motivated  Client Treatment Preferences  Outpatient Trauma therapy  Client Statement of Needs  "I need some  help dealing with my anxiety and PTSD" Treatment Level  Outpatient Individual therapy  Symptoms  Describes a reliving of the event, particularly through dissociative flashbacks.:  (Status: maintained). Displays a significant decline in interest and engagement in activities.:  (Status: maintained). Displays significant psychological and/or physiological  distress resulting from internal and external clues that are reminiscent of the traumatic event.: (Status: maintained). Experiences disturbances in sleep.:   (Status: maintained). Experiences disturbing and persistent thoughts, images, and/or perceptions of the  traumatic event.: (Status: maintained). Experiences frequent nightmares.: No  Description Entered (Status: improved). Has been exposed to a traumatic event involving actual or  perceived threat of death or serious injury.:  (Status: maintained).  Hypervigilance (e.g., feeling constantly on edge, experiencing concentration difficulties, having trouble falling or staying asleep, exhibiting a general state of irritability).:  (Status:  maintained). Impairment in social, occupational, or other areas of functioning.:  (Status: maintained). Intentionally avoids activities, places, people, or objects (e.g., up-armored vehicles) that evoke memories of the event.:  (Status: maintained). Intentionally avoids  thoughts, feelings, or discussions related to the traumatic event.:  (Status:  maintained). Reports difficulty concentrating as well as feelings of  guilt.:   (Status: maintained). Reports response of intense fear, helplessness, or horror to the traumatic event.:  (Status:  maintained). Symptoms present more than one month.: (Status: maintained).   Problems Addressed  Anxiety, Posttraumatic Stress Disorder (PTSD), Posttraumatic Stress Disorder (PTSD), Anxiety,  Posttraumatic Stress Disorder (PTSD)  Goals 1. Enhance ability to effectively cope with the full variety of life's worries  and anxieties. Objective Identify and engage in pleasant activities on a daily basis. Target Date: 2022-12-24 Frequency: weekly Progress: 0 Modality: individual Objective Identify, challenge, and replace biased, fearful self-talk with positive, realistic, and empowering selftalk. Target Date: 2022-12-24 Frequency: weekly Progress: 0 Modality: individual Related Interventions 1. Assign the client a homework exercise in which he/she identifies fearful self-talk, identifies  biases in the self-talk, generates alternatives, and tests through behavioral experiments (or  assign "Negative Thoughts Trigger Negative Feelings" in the Adult Psychotherapy Homework  Planner by [facility]); review and reinforce success, providing corrective feedback toward  improvement. 2. Explore the client's schema and self-talk that mediate his/her fear response; assist him/her in  challenging the biases; replace the distorted messages with reality-based alternatives and  positive, realistic self-talk that will increase his/her self-confidence in coping with irrational  fears (see Cognitive Therapy of Anxiety Disorders by Alison Stalling).  2. No longer avoids persons, places, activities, and objects that are  reminiscent of the traumatic event. 3. No longer experiences intrusive event recollections, avoidance of event  reminders, intense arousal, or disinterest in activities or  relationships. 4. Stabilize anxiety level while increasing ability to function on a daily  basis. 5.  Thinks about or openly discusses the traumatic event with others  without experiencing psychological or physiological distress. Objective Participate in Cognitive Therapy to help identify, challenge, and replace biased, negative, and selfdefeating thoughts resulting from the trauma. Target Date: 2022-12-24 Frequency: weekly Progress: 0 Modality: individual Related Interventions 1. Using Cognitive Therapy techniques, explore the client's self-talk and beliefs about self, others,  and the future that are a consequence of the trauma (e.g., themes of safety, trust, power, control,  esteem, and intimacy); identify and challenge biases; assist him/her in generating appraisals that correct for the biases; test biased and alternatives predictions through behavioral experiments. Objective Participate in Eye Movement Desensitization and Reprocessing (EMDR) to reduce emotional distress  related to traumatic thoughts, feelings, and images. Target Date: 2022-12-24 Frequency: weekly Progress: 0 Modality: individual Related Interventions 1. Utilize Eye Movement Desensitization and Reprocessing (EMDR) to reduce the client's  emotional reactivity to the traumatic event and reduce PTSD symptoms.  Diagnosis  F43.10 (Posttraumatic stress disorder) - Open - [Signifier: n/a] Posttraumatic Stress  Disorder  Conditions For Discharge Achievement of treatment goals and objectives   Lauris Serviss G Destyni Hoppel, LCSW

## 2022-02-15 NOTE — ED Triage Notes (Signed)
Patient here POV from Home.  Endorses LUQ ABD Pain that has been present for approximately 1 Year. States it is consistent and constant but has worsened recently.   No N/V. Interchanging Diarrhea and Constipation. No Blood noted  NAD Noted during Triage. A&Ox4. GCS 15. Ambulatory.

## 2022-02-16 NOTE — Telephone Encounter (Signed)
Pt was notified of Carl Best NP recommendations: Pt stated that she went to the ED yesterday and had EKG done and labs done. Chart reviewed. Pt left AMA  Pt reiterated Carl Best NP recommendations:  Pt requested office visit: Pt was scheduled for 02/23/2022 at 1:30 PM with Carl Best NP: Pt verbalized understanding with all questions answered.

## 2022-02-16 NOTE — Telephone Encounter (Signed)
[name], if patient has severe abdominal pain I recommend for her to go to the ED for further evaluation. I will add Dr. Silverio Decamp to this message as I am out of the office until 02/20/2021.

## 2022-02-22 ENCOUNTER — Ambulatory Visit (INDEPENDENT_AMBULATORY_CARE_PROVIDER_SITE_OTHER): Payer: Medicare Other | Admitting: Psychology

## 2022-02-22 DIAGNOSIS — F603 Borderline personality disorder: Secondary | ICD-10-CM | POA: Diagnosis not present

## 2022-02-22 DIAGNOSIS — F4322 Adjustment disorder with anxiety: Secondary | ICD-10-CM | POA: Diagnosis not present

## 2022-02-22 DIAGNOSIS — F509 Eating disorder, unspecified: Secondary | ICD-10-CM

## 2022-02-22 DIAGNOSIS — F4310 Post-traumatic stress disorder, unspecified: Secondary | ICD-10-CM

## 2022-02-22 DIAGNOSIS — F411 Generalized anxiety disorder: Secondary | ICD-10-CM | POA: Diagnosis not present
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
## 2022-02-23 ENCOUNTER — Encounter: Payer: Self-pay | Admitting: Nurse Practitioner

## 2022-02-23 ENCOUNTER — Ambulatory Visit (INDEPENDENT_AMBULATORY_CARE_PROVIDER_SITE_OTHER): Payer: Medicare Other | Admitting: Nurse Practitioner

## 2022-02-23 VITALS — BP 116/68 | HR 71 | Ht 63.0 in | Wt 130.0 lb

## 2022-02-23 DIAGNOSIS — R1032 Left lower quadrant pain: Secondary | ICD-10-CM

## 2022-02-23 DIAGNOSIS — R1012 Left upper quadrant pain: Secondary | ICD-10-CM

## 2022-02-23 DIAGNOSIS — R109 Unspecified abdominal pain: Secondary | ICD-10-CM | POA: Diagnosis not present

## 2022-02-23 DIAGNOSIS — K5909 Other constipation: Secondary | ICD-10-CM | POA: Diagnosis not present
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
## 2022-02-23 NOTE — Patient Instructions (Signed)
Linzess- take 1 by mouth daily.  You have been scheduled for a CT scan of the abdomen and pelvis at [facility], 1st floor Radiology. You are scheduled on 02/26/22 at 12 pm. You should arrive 2 hours prior to your appointment time for registration. Make sure that you have nothing to eat or drink 4 hours prior.  You may take any medications as prescribed with a small amount of water, if necessary. If you take any of the following medications: METFORMIN, GLUCOPHAGE, GLUCOVANCE, AVANDAMET, RIOMET, FORTAMET, Valentine MET, JANUMET, GLUMETZA or METAGLIP, you MAY be asked to HOLD this medication 48 hours AFTER the exam.   The purpose of you drinking the oral contrast is to aid in the visualization of your intestinal tract. The contrast solution may cause some diarrhea. Depending on your individual set of symptoms, you may also receive an intravenous injection of x-ray contrast/dye. Plan on being at [facility] for 45 minutes or longer, depending on the type of exam you are having performed.   If you have any questions regarding your exam or if you need to reschedule, you may call [facility] at [phone] between the hours of 8:00 am and 5:00 pm, Monday-Friday.     Thank you for trusting me with your gastrointestinal care!   Carl Best, CRNP

## 2022-02-26 ENCOUNTER — Ambulatory Visit (HOSPITAL_BASED_OUTPATIENT_CLINIC_OR_DEPARTMENT_OTHER)
Admission: RE | Admit: 2022-02-26 | Discharge: 2022-02-26 | Disposition: A | Discharge status: Home / Self Care | Payer: Medicare Other | Source: Ambulatory Visit | Attending: Nurse Practitioner | Admitting: Nurse Practitioner

## 2022-02-26 DIAGNOSIS — K5909 Other constipation: Secondary | ICD-10-CM | POA: Insufficient documentation

## 2022-02-26 DIAGNOSIS — R1032 Left lower quadrant pain: Secondary | ICD-10-CM | POA: Diagnosis present

## 2022-02-26 DIAGNOSIS — R109 Unspecified abdominal pain: Secondary | ICD-10-CM | POA: Insufficient documentation

## 2022-02-26 DIAGNOSIS — R1012 Left upper quadrant pain: Secondary | ICD-10-CM | POA: Insufficient documentation

## 2022-02-26 MED ORDER — IOHEXOL 350 MG/ML SOLN
75.0000 mL | Freq: Once | INTRAVENOUS | Status: AC | PRN
  Administered 2022-02-26: 75 mL via INTRAVENOUS

## 2022-03-01 ENCOUNTER — Ambulatory Visit (INDEPENDENT_AMBULATORY_CARE_PROVIDER_SITE_OTHER): Payer: Medicare Other | Admitting: Psychology

## 2022-03-01 DIAGNOSIS — F603 Borderline personality disorder: Secondary | ICD-10-CM | POA: Diagnosis not present

## 2022-03-01 DIAGNOSIS — F4310 Post-traumatic stress disorder, unspecified: Secondary | ICD-10-CM | POA: Diagnosis not present

## 2022-03-01 DIAGNOSIS — F509 Eating disorder, unspecified: Secondary | ICD-10-CM

## 2022-03-01 DIAGNOSIS — F411 Generalized anxiety disorder: Secondary | ICD-10-CM

## 2022-03-01 NOTE — Progress Notes (Signed)
[place] Counselor/Therapist Progress Note  Patient ID: [name], MRN: [id number],    Date: 03/01/2022  Time Spent: 45 minutes  Treatment Type: Individual Therapy  Reported Symptoms: dissociation, panic attacks, anxiety  Mental Status Exam: Appearance:  Casual     Behavior: Attention-Seeking  Motor: Restlestness  Speech/Language:  Normal Rate  Affect: Blunt  Mood: pleasant  Thought process: concrete  Thought content:   Questionable delusions  Sensory/Perceptual disturbances:   WNL  Orientation: oriented to person, place, time/date, and situation  Attention: Poor  Concentration: Fair  Memory: [place] of knowledge:  Good  Insight:   Poor  Judgment:  Poor  Impulse Control: Fair   Risk Assessment: Danger to Self:  No Self-injurious Behavior: No Danger to Others: No Duty to Warn:no Physical Aggression / Violence:No  Access to Firearms a concern: No  Gang Involvement:No   Subjective: The patient attended a face-to-face individual therapy session via video visit.  The patient gave verbal consent for the session to be on web ex.  The patient was in her home alone and the therapist was in the office.  The patient reports that she had a realization that she is alive.  We talked about how she knows this and she seems to have a better understanding that she is alive.  This is some progress for her as she usually questions me about this.  We talked about the things that are most pressing to her and she would like to stop dissociating and she would like to work with herself on her weight.  We talked about her needing to focus more on health as opposed to weight loss.  She did get some results back from the test that she took for her abdomen and everything seems to be normal except she was having some constipation.  We talked about her needing to possibly reach out to her GI person since she had to take some medication that caused her some upset stomach.  The  patient does seem to be making some minimal progress.  We talked about her writing her dreams down so that we can focus on what is going on there and I explained to her again about self talk and that she is responsible for her own self talk.  I recommended that every morning she wake up take a nice deep breath and tell herself that she is going to have a good day as opposed to focusing on how anxious she is. Interventions: Cognitive Behavioral Therapy, Dialectical Behavioral Therapy, Solution-Oriented/Positive Psychology, Eye Movement Desensitization and Reprocessing (EMDR), and Insight-Oriented  Diagnosis:Borderline personality disorder in adult [facility])  GAD (generalized anxiety disorder)  Eating disorder with ongoing treatment  PTSD (post-traumatic stress disorder)  Plan: Treatment Plan Client Abilities/Strengths  Intelligent, kind, motivated  Client Treatment Preferences  Outpatient Trauma therapy  Client Statement of Needs  "I need some  help dealing with my anxiety and PTSD" Treatment Level  Outpatient Individual therapy  Symptoms  Describes a reliving of the event, particularly through dissociative flashbacks.:  (Status: maintained). Displays a significant decline in interest and engagement in activities.:  (Status: maintained). Displays significant psychological and/or physiological  distress resulting from internal and external clues that are reminiscent of the traumatic event.: (Status: maintained). Experiences disturbances in sleep.:   (Status: maintained). Experiences disturbing and persistent thoughts, images, and/or perceptions of the  traumatic event.: (Status: maintained). Experiences frequent nightmares.: No  Description Entered (Status: improved). Has been exposed to a traumatic event involving  actual or  perceived threat of death or serious injury.:  (Status: maintained).  Hypervigilance (e.g., feeling constantly on edge, experiencing concentration difficulties, having  trouble falling or staying asleep, exhibiting a general state of irritability).:  (Status:  maintained). Impairment in social, occupational, or other areas of functioning.:  (Status: maintained). Intentionally avoids activities, places, people, or objects (e.g., up-armored vehicles) that evoke memories of the event.:  (Status: maintained). Intentionally avoids  thoughts, feelings, or discussions related to the traumatic event.:  (Status:  maintained). Reports difficulty concentrating as well as feelings of guilt.:   (Status: maintained). Reports response of intense fear, helplessness, or horror to the traumatic event.:  (Status: maintained). Symptoms present more than one month.: (Status: maintained).   Problems Addressed  Anxiety, Posttraumatic Stress Disorder (PTSD), Posttraumatic Stress Disorder (PTSD), Anxiety,  Posttraumatic Stress Disorder (PTSD)  Goals 1. Enhance ability to effectively cope with the full variety of life's worries  and anxieties. Objective Identify and engage in pleasant activities on a daily basis. Target Date: 2022-12-24 Frequency: weekly Progress: 0 Modality: individual Objective Identify, challenge, and replace biased, fearful self-talk with positive, realistic, and empowering selftalk. Target Date: 2022-12-24 Frequency: weekly Progress: 0 Modality: individual Related Interventions 1. Assign the client a homework exercise in which he/she identifies fearful self-talk, identifies  biases in the self-talk, generates alternatives, and tests through behavioral experiments (or  assign "Negative Thoughts Trigger Negative Feelings" in the Adult Psychotherapy Homework  Planner by [facility]); review and reinforce success, providing corrective feedback toward  improvement. 2. Explore the client's schema and self-talk that mediate his/her fear response; assist him/her in  challenging the biases; replace the distorted messages with reality-based alternatives and  positive,  realistic self-talk that will increase his/her self-confidence in coping with irrational  fears (see Cognitive Therapy of Anxiety Disorders by Alison Stalling).  2. No longer avoids persons, places, activities, and objects that are  reminiscent of the traumatic event. 3. No longer experiences intrusive event recollections, avoidance of event  reminders, intense arousal, or disinterest in activities or  relationships. 4. Stabilize anxiety level while increasing ability to function on a daily  basis. 5. Thinks about or openly discusses the traumatic event with others  without experiencing psychological or physiological distress. Objective Participate in Cognitive Therapy to help identify, challenge, and replace biased, negative, and selfdefeating thoughts resulting from the trauma. Target Date: 2022-12-24 Frequency: weekly Progress: 0 Modality: individual Related Interventions 1. Using Cognitive Therapy techniques, explore the client's self-talk and beliefs about self, others,  and the future that are a consequence of the trauma (e.g., themes of safety, trust, power, control,  esteem, and intimacy); identify and challenge biases; assist him/her in generating appraisals that correct for the biases; test biased and alternatives predictions through behavioral experiments. Objective Participate in Eye Movement Desensitization and Reprocessing (EMDR) to reduce emotional distress  related to traumatic thoughts, feelings, and images. Target Date: 2022-12-24 Frequency: weekly Progress: 0 Modality: individual Related Interventions 1. Utilize Eye Movement Desensitization and Reprocessing (EMDR) to reduce the client's  emotional reactivity to the traumatic event and reduce PTSD symptoms.  Diagnosis  F43.10 (Posttraumatic stress disorder) - Open - [Signifier: n/a] Posttraumatic Stress  Disorder  Conditions For Discharge Achievement of treatment goals and objectives   Janice Heater G Clevon Khader, LCSW

## 2022-03-02 NOTE — Telephone Encounter (Signed)
[facility]. Thanks

## 2022-03-08 ENCOUNTER — Ambulatory Visit (INDEPENDENT_AMBULATORY_CARE_PROVIDER_SITE_OTHER): Payer: Medicare Other | Admitting: Psychology

## 2022-03-08 DIAGNOSIS — F603 Borderline personality disorder: Secondary | ICD-10-CM | POA: Diagnosis not present

## 2022-03-08 DIAGNOSIS — F4310 Post-traumatic stress disorder, unspecified: Secondary | ICD-10-CM | POA: Diagnosis not present

## 2022-03-08 DIAGNOSIS — F509 Eating disorder, unspecified: Secondary | ICD-10-CM

## 2022-03-08 DIAGNOSIS — F411 Generalized anxiety disorder: Secondary | ICD-10-CM | POA: Diagnosis not present

## 2022-03-08 NOTE — Progress Notes (Signed)
[place] Counselor/Therapist Progress Note  Patient ID: [name], MRN: [id number],    Date: 03/01/2022  Time Spent: 60 minutes  Treatment Type: Individual Therapy  Reported Symptoms: dissociation, panic attacks, anxiety  Mental Status Exam: Appearance:  Casual     Behavior: Attention-Seeking  Motor: Restlestness  Speech/Language:  Normal Rate  Affect: Blunt  Mood: pleasant  Thought process: concrete  Thought content:   Questionable delusions  Sensory/Perceptual disturbances:   WNL  Orientation: oriented to person, place, time/date, and situation  Attention: Poor  Concentration: Fair  Memory: [place] of knowledge:  Good  Insight:   Poor  Judgment:  Poor  Impulse Control: Fair   Risk Assessment: Danger to Self:  No Self-injurious Behavior: No Danger to Others: No Duty to Warn:no Physical Aggression / Violence:No  Access to Firearms a concern: No  Gang Involvement:No   Subjective: The patient attended a face-to-face individual therapy session in the office today.  The patient reports that she went to see [name] and he is apparently on morphine and likely going to pass away.  The patient seems to be doing a little better with her dissociation and is working with herself on giving herself the message that obviously she is here.  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  We talked about some of the things that were going on in her family system and it appears that all of the children ended up having some pretty significant mental health issues.  We talked about the need for her to continue to work with herself to manage her anxiety more.  The patient did talk about her eating disorder and how she is being triggered with being overwhelmed.  Part of this may be related to the fact that her friend is passing away.  She reports that she did binge and purge this week and also use laxatives.  We will continue to work on getting her more  stable and I encouraged her to meditate and do mindfulness as much as she possibly can.  Interventions: Cognitive Behavioral Therapy, Dialectical Behavioral Therapy, Solution-Oriented/Positive Psychology, Eye Movement Desensitization and Reprocessing (EMDR), and Insight-Oriented  Diagnosis:Borderline personality disorder in adult [facility])  GAD (generalized anxiety disorder)  Eating disorder with ongoing treatment  PTSD (post-traumatic stress disorder)  Plan: Treatment Plan Client Abilities/Strengths  Intelligent, kind, motivated  Client Treatment Preferences  Outpatient Trauma therapy  Client Statement of Needs  "I need some  help dealing with my anxiety and PTSD" Treatment Level  Outpatient Individual therapy  Symptoms  Describes a reliving of the event, particularly through dissociative flashbacks.:  (Status: maintained). Displays a significant decline in interest and engagement in activities.:  (Status: maintained). Displays significant psychological and/or physiological  distress resulting from internal and external clues that are reminiscent of the traumatic event.: (Status: maintained). Experiences disturbances in sleep.:   (Status: maintained). Experiences disturbing and persistent thoughts, images, and/or perceptions of the  traumatic event.: (Status: maintained). Experiences frequent nightmares.: No  Description Entered (Status: improved). Has been exposed to a traumatic event involving actual or  perceived threat of death or serious injury.:  (Status: maintained).  Hypervigilance (e.g., feeling constantly on edge, experiencing concentration difficulties, having trouble falling or staying asleep, exhibiting a general state of irritability).:  (Status:  maintained). Impairment in social, occupational, or other areas of functioning.:  (Status: maintained). Intentionally avoids activities, places, people, or objects (e.g., up-armored vehicles) that evoke memories of the event.:   (Status: maintained). Intentionally  avoids  thoughts, feelings, or discussions related to the traumatic event.:  (Status:  maintained). Reports difficulty concentrating as well as feelings of guilt.:   (Status: maintained). Reports response of intense fear, helplessness, or horror to the traumatic event.:  (Status: maintained). Symptoms present more than one month.: (Status: maintained).   Problems Addressed  Anxiety, Posttraumatic Stress Disorder (PTSD), Posttraumatic Stress Disorder (PTSD), Anxiety,  Posttraumatic Stress Disorder (PTSD)  Goals 1. Enhance ability to effectively cope with the full variety of life's worries  and anxieties. Objective Identify and engage in pleasant activities on a daily basis. Target Date: 2022-12-24 Frequency: weekly Progress: 0 Modality: individual Objective Identify, challenge, and replace biased, fearful self-talk with positive, realistic, and empowering selftalk. Target Date: 2022-12-24 Frequency: weekly Progress: 0 Modality: individual Related Interventions 1. Assign the client a homework exercise in which he/she identifies fearful self-talk, identifies  biases in the self-talk, generates alternatives, and tests through behavioral experiments (or  assign "Negative Thoughts Trigger Negative Feelings" in the Adult Psychotherapy Homework  Planner by [facility]); review and reinforce success, providing corrective feedback toward  improvement. 2. Explore the client's schema and self-talk that mediate his/her fear response; assist him/her in  challenging the biases; replace the distorted messages with reality-based alternatives and  positive, realistic self-talk that will increase his/her self-confidence in coping with irrational  fears (see Cognitive Therapy of Anxiety Disorders by Alison Stalling).  2. No longer avoids persons, places, activities, and objects that are  reminiscent of the traumatic event. 3. No longer experiences intrusive event  recollections, avoidance of event  reminders, intense arousal, or disinterest in activities or  relationships. 4. Stabilize anxiety level while increasing ability to function on a daily  basis. 5. Thinks about or openly discusses the traumatic event with others  without experiencing psychological or physiological distress. Objective Participate in Cognitive Therapy to help identify, challenge, and replace biased, negative, and selfdefeating thoughts resulting from the trauma. Target Date: 2022-12-24 Frequency: weekly Progress: 0 Modality: individual Related Interventions 1. Using Cognitive Therapy techniques, explore the client's self-talk and beliefs about self, others,  and the future that are a consequence of the trauma (e.g., themes of safety, trust, power, control,  esteem, and intimacy); identify and challenge biases; assist him/her in generating appraisals that correct for the biases; test biased and alternatives predictions through behavioral experiments. Objective Participate in Eye Movement Desensitization and Reprocessing (EMDR) to reduce emotional distress  related to traumatic thoughts, feelings, and images. Target Date: 2022-12-24 Frequency: weekly Progress: 0 Modality: individual Related Interventions 1. Utilize Eye Movement Desensitization and Reprocessing (EMDR) to reduce the client's  emotional reactivity to the traumatic event and reduce PTSD symptoms.  Diagnosis  F43.10 (Posttraumatic stress disorder) - Open - [Signifier: n/a] Posttraumatic Stress  Disorder  Conditions For Discharge Achievement of treatment goals and objectives   Shalah Estelle G Jetaime Pinnix, LCSW

## 2022-03-15 ENCOUNTER — Ambulatory Visit (INDEPENDENT_AMBULATORY_CARE_PROVIDER_SITE_OTHER): Payer: Medicare Other | Admitting: Psychology

## 2022-03-15 DIAGNOSIS — F509 Eating disorder, unspecified: Secondary | ICD-10-CM | POA: Diagnosis not present

## 2022-03-15 DIAGNOSIS — F603 Borderline personality disorder: Secondary | ICD-10-CM | POA: Diagnosis not present

## 2022-03-15 DIAGNOSIS — F411 Generalized anxiety disorder: Secondary | ICD-10-CM

## 2022-03-15 DIAGNOSIS — F4310 Post-traumatic stress disorder, unspecified: Secondary | ICD-10-CM | POA: Diagnosis not present

## 2022-03-15 DIAGNOSIS — F4322 Adjustment disorder with anxiety: Secondary | ICD-10-CM

## 2022-03-15 NOTE — Progress Notes (Signed)
[place] Counselor/Therapist Progress Note  Patient ID: [name], MRN: [id number],    Date: 03/15/2022  Time Spent: 60 minutes  Treatment Type: Individual Therapy  Reported Symptoms: dissociation, panic attacks, anxiety  Mental Status Exam: Appearance:  Casual     Behavior: Attention-Seeking  Motor: Restlestness  Speech/Language:  Normal Rate  Affect: Blunt  Mood: pleasant  Thought process: concrete  Thought content:   Questionable delusions  Sensory/Perceptual disturbances:   WNL  Orientation: oriented to person, place, time/date, and situation  Attention: Poor  Concentration: Fair  Memory: [place] of knowledge:  Good  Insight:   Poor  Judgment:  Poor  Impulse Control: Fair   Risk Assessment: Danger to Self:  No Self-injurious Behavior: No Danger to Others: No Duty to Warn:no Physical Aggression / Violence:No  Access to Firearms a concern: No  Gang Involvement:No   Subjective: The patient attended a face-to-face individual therapy session in the office today.  The patient presents as pleasant and cooperative.  She states that [name] passed away last week.  The patient almost became tearful during the session today.  We talked about the patient's eating disorder and I asked her if she really wanted to do anything about that.  She seemed to be confused by that question.  She asked me if I was going to fire her.  I explained that No .I was not going to fire her but that she needed to decide if that was something that she felt like she needed to work on because we were doing therapy for her.  She then said that she was going to try to find a healthy way to lose weight and thought about weight watchers.  In addition I recommended that she think about what she wanted to accomplish in therapy.  She states that she wants to be less anxious and we will talk more about what it is that she would like to create for herself.  The patient asked again whether I  thought she should move out of her apartment because she does not feel like it is real to her.  I explained to her that even if she moves she is taking herself with her and that she would likely have anxiety even if she moved.  I explained to her that she can do what ever she wants to do and then she backed down and said she did not have the money to move.  It seems that she wants to set up conversations where they are adversarial to some degree.  She wants someone to tell her what to do and I refused to do that.  She says that she is going to pursue a healthier eating plan. Interventions: Cognitive Behavioral Therapy, Dialectical Behavioral Therapy, Solution-Oriented/Positive Psychology, Eye Movement Desensitization and Reprocessing (EMDR), and Insight-Oriented  Diagnosis:Borderline personality disorder in adult [facility])  GAD (generalized anxiety disorder)  Eating disorder with ongoing treatment  PTSD (post-traumatic stress disorder)  Adjustment disorder with anxiety  Plan: Treatment Plan Client Abilities/Strengths  Intelligent, kind, motivated  Client Treatment Preferences  Outpatient Trauma therapy  Client Statement of Needs  "I need some  help dealing with my anxiety and PTSD" Treatment Level  Outpatient Individual therapy  Symptoms  Describes a reliving of the event, particularly through dissociative flashbacks.:  (Status: maintained). Displays a significant decline in interest and engagement in activities.:  (Status: maintained). Displays significant psychological and/or physiological  distress resulting from internal and external clues that are reminiscent of  the traumatic event.: (Status: maintained). Experiences disturbances in sleep.:   (Status: maintained). Experiences disturbing and persistent thoughts, images, and/or perceptions of the  traumatic event.: (Status: maintained). Experiences frequent nightmares.: No  Description Entered (Status: improved). Has been exposed to a  traumatic event involving actual or  perceived threat of death or serious injury.:  (Status: maintained).  Hypervigilance (e.g., feeling constantly on edge, experiencing concentration difficulties, having trouble falling or staying asleep, exhibiting a general state of irritability).:  (Status:  maintained). Impairment in social, occupational, or other areas of functioning.:  (Status: maintained). Intentionally avoids activities, places, people, or objects (e.g., up-armored vehicles) that evoke memories of the event.:  (Status: maintained). Intentionally avoids  thoughts, feelings, or discussions related to the traumatic event.:  (Status:  maintained). Reports difficulty concentrating as well as feelings of guilt.:   (Status: maintained). Reports response of intense fear, helplessness, or horror to the traumatic event.:  (Status: maintained). Symptoms present more than one month.: (Status: maintained).   Problems Addressed  Anxiety, Posttraumatic Stress Disorder (PTSD), Posttraumatic Stress Disorder (PTSD), Anxiety,  Posttraumatic Stress Disorder (PTSD)  Goals 1. Enhance ability to effectively cope with the full variety of life's worries  and anxieties. Objective Identify and engage in pleasant activities on a daily basis. Target Date: 2022-12-24 Frequency: weekly Progress: 0 Modality: individual Objective Identify, challenge, and replace biased, fearful self-talk with positive, realistic, and empowering selftalk. Target Date: 2022-12-24 Frequency: weekly Progress: 0 Modality: individual Related Interventions 1. Assign the client a homework exercise in which he/she identifies fearful self-talk, identifies  biases in the self-talk, generates alternatives, and tests through behavioral experiments (or  assign "Negative Thoughts Trigger Negative Feelings" in the Adult Psychotherapy Homework  Planner by [facility]); review and reinforce success, providing corrective feedback toward   improvement. 2. Explore the client's schema and self-talk that mediate his/her fear response; assist him/her in  challenging the biases; replace the distorted messages with reality-based alternatives and  positive, realistic self-talk that will increase his/her self-confidence in coping with irrational  fears (see Cognitive Therapy of Anxiety Disorders by Alison Stalling).  2. No longer avoids persons, places, activities, and objects that are  reminiscent of the traumatic event. 3. No longer experiences intrusive event recollections, avoidance of event  reminders, intense arousal, or disinterest in activities or  relationships. 4. Stabilize anxiety level while increasing ability to function on a daily  basis. 5. Thinks about or openly discusses the traumatic event with others  without experiencing psychological or physiological distress. Objective Participate in Cognitive Therapy to help identify, challenge, and replace biased, negative, and selfdefeating thoughts resulting from the trauma. Target Date: 2022-12-24 Frequency: weekly Progress: 0 Modality: individual Related Interventions 1. Using Cognitive Therapy techniques, explore the client's self-talk and beliefs about self, others,  and the future that are a consequence of the trauma (e.g., themes of safety, trust, power, control,  esteem, and intimacy); identify and challenge biases; assist him/her in generating appraisals that correct for the biases; test biased and alternatives predictions through behavioral experiments. Objective Participate in Eye Movement Desensitization and Reprocessing (EMDR) to reduce emotional distress  related to traumatic thoughts, feelings, and images. Target Date: 2022-12-24 Frequency: weekly Progress: 0 Modality: individual Related Interventions 1. Utilize Eye Movement Desensitization and Reprocessing (EMDR) to reduce the client's  emotional reactivity to the traumatic event and reduce PTSD  symptoms.  Diagnosis  F43.10 (Posttraumatic stress disorder) - Open - [Signifier: n/a] Posttraumatic Stress  Disorder  Conditions For Discharge Achievement of treatment goals and objectives  Lylia Karn G Leniyah Martell, LCSW

## 2022-03-22 ENCOUNTER — Ambulatory Visit (INDEPENDENT_AMBULATORY_CARE_PROVIDER_SITE_OTHER): Payer: Medicare Other | Admitting: Psychology

## 2022-03-22 DIAGNOSIS — F509 Eating disorder, unspecified: Secondary | ICD-10-CM

## 2022-03-22 DIAGNOSIS — F603 Borderline personality disorder: Secondary | ICD-10-CM

## 2022-03-22 DIAGNOSIS — F4310 Post-traumatic stress disorder, unspecified: Secondary | ICD-10-CM | POA: Diagnosis not present

## 2022-03-22 DIAGNOSIS — F411 Generalized anxiety disorder: Secondary | ICD-10-CM | POA: Diagnosis not present

## 2022-03-22 DIAGNOSIS — F4322 Adjustment disorder with anxiety: Secondary | ICD-10-CM

## 2022-03-22 NOTE — Progress Notes (Signed)
[place] Counselor/Therapist Progress Note  Patient ID: [name], MRN: [id number],    Date: 03/22/2022  Time Spent: 60 minutes  Treatment Type: Individual Therapy  Reported Symptoms: dissociation, panic attacks, anxiety  Mental Status Exam: Appearance:  Casual     Behavior: Attention-Seeking  Motor: Restlestness  Speech/Language:  Normal Rate  Affect: Blunt  Mood: pleasant  Thought process: concrete  Thought content:   Questionable delusions  Sensory/Perceptual disturbances:   WNL  Orientation: oriented to person, place, time/date, and situation  Attention: Poor  Concentration: Fair  Memory: [place] of knowledge:  Good  Insight:   Poor  Judgment:  Poor  Impulse Control: Fair   Risk Assessment: Danger to Self:  No Self-injurious Behavior: No Danger to Others: No Duty to Warn:no Physical Aggression / Violence:No  Access to Firearms a concern: No  Gang Involvement:No   Subjective: The patient attended a face-to-face individual therapy session in the office today.  The patient presents with a blunted affect and mood is pleasant and cooperative.  The patient reports that [name]'s obituary was online.  She had me read it and she was minimizing her relationship with [name].  She kept saying that he treated everybody the same and that she was not special to him.  I pointed out to her that she may be doing this to try to not feel as much pain.  She states that she is just being realistic.  The patient is doing much better with her dissociative symptoms and she reports that she still wakes up anxious.  I explained to her that I do not want her focusing on her anxiety and talked with her about changing her thoughts.  I gave her an assignment to do 3 good things before she goes to bed at night for at least 3 weeks.  Interventions: Cognitive Behavioral Therapy, Dialectical Behavioral Therapy, Solution-Oriented/Positive Psychology, Eye Movement  Desensitization and Reprocessing (EMDR), and Insight-Oriented  Diagnosis:Borderline personality disorder in adult [facility])  GAD (generalized anxiety disorder)  Eating disorder with ongoing treatment  PTSD (post-traumatic stress disorder)  Adjustment disorder with anxiety  Plan: Treatment Plan Client Abilities/Strengths  Intelligent, kind, motivated  Client Treatment Preferences  Outpatient Trauma therapy  Client Statement of Needs  "I need some  help dealing with my anxiety and PTSD" Treatment Level  Outpatient Individual therapy  Symptoms  Describes a reliving of the event, particularly through dissociative flashbacks.:  (Status: maintained). Displays a significant decline in interest and engagement in activities.:  (Status: maintained). Displays significant psychological and/or physiological  distress resulting from internal and external clues that are reminiscent of the traumatic event.: (Status: maintained). Experiences disturbances in sleep.:   (Status: maintained). Experiences disturbing and persistent thoughts, images, and/or perceptions of the  traumatic event.: (Status: maintained). Experiences frequent nightmares.: No  Description Entered (Status: improved). Has been exposed to a traumatic event involving actual or  perceived threat of death or serious injury.:  (Status: maintained).  Hypervigilance (e.g., feeling constantly on edge, experiencing concentration difficulties, having trouble falling or staying asleep, exhibiting a general state of irritability).:  (Status:  maintained). Impairment in social, occupational, or other areas of functioning.:  (Status: maintained). Intentionally avoids activities, places, people, or objects (e.g., up-armored vehicles) that evoke memories of the event.:  (Status: maintained). Intentionally avoids  thoughts, feelings, or discussions related to the traumatic event.:  (Status:  maintained). Reports difficulty concentrating as well as  feelings of guilt.:   (Status: maintained). Reports response of intense fear,  helplessness, or horror to the traumatic event.:  (Status: maintained). Symptoms present more than one month.: (Status: maintained).   Problems Addressed  Anxiety, Posttraumatic Stress Disorder (PTSD), Posttraumatic Stress Disorder (PTSD), Anxiety,  Posttraumatic Stress Disorder (PTSD)  Goals 1. Enhance ability to effectively cope with the full variety of life's worries  and anxieties. Objective Identify and engage in pleasant activities on a daily basis. Target Date: 2022-12-24 Frequency: weekly Progress: 0 Modality: individual Objective Identify, challenge, and replace biased, fearful self-talk with positive, realistic, and empowering selftalk. Target Date: 2022-12-24 Frequency: weekly Progress: 0 Modality: individual Related Interventions 1. Assign the client a homework exercise in which he/she identifies fearful self-talk, identifies  biases in the self-talk, generates alternatives, and tests through behavioral experiments (or  assign "Negative Thoughts Trigger Negative Feelings" in the Adult Psychotherapy Homework  Planner by [facility]); review and reinforce success, providing corrective feedback toward  improvement. 2. Explore the client's schema and self-talk that mediate his/her fear response; assist him/her in  challenging the biases; replace the distorted messages with reality-based alternatives and  positive, realistic self-talk that will increase his/her self-confidence in coping with irrational  fears (see Cognitive Therapy of Anxiety Disorders by Alison Stalling).  2. No longer avoids persons, places, activities, and objects that are  reminiscent of the traumatic event. 3. No longer experiences intrusive event recollections, avoidance of event  reminders, intense arousal, or disinterest in activities or  relationships. 4. Stabilize anxiety level while increasing ability to function on a daily   basis. 5. Thinks about or openly discusses the traumatic event with others  without experiencing psychological or physiological distress. Objective Participate in Cognitive Therapy to help identify, challenge, and replace biased, negative, and selfdefeating thoughts resulting from the trauma. Target Date: 2022-12-24 Frequency: weekly Progress: 0 Modality: individual Related Interventions 1. Using Cognitive Therapy techniques, explore the client's self-talk and beliefs about self, others,  and the future that are a consequence of the trauma (e.g., themes of safety, trust, power, control,  esteem, and intimacy); identify and challenge biases; assist him/her in generating appraisals that correct for the biases; test biased and alternatives predictions through behavioral experiments. Objective Participate in Eye Movement Desensitization and Reprocessing (EMDR) to reduce emotional distress  related to traumatic thoughts, feelings, and images. Target Date: 2022-12-24 Frequency: weekly Progress: 0 Modality: individual Related Interventions 1. Utilize Eye Movement Desensitization and Reprocessing (EMDR) to reduce the client's  emotional reactivity to the traumatic event and reduce PTSD symptoms.  Diagnosis  F43.10 (Posttraumatic stress disorder) - Open - [Signifier: n/a] Posttraumatic Stress  Disorder  Conditions For Discharge Achievement of treatment goals and objectives   Jaden Batchelder G Trystin Hargrove, LCSW

## 2022-03-28 ENCOUNTER — Ambulatory Visit (INDEPENDENT_AMBULATORY_CARE_PROVIDER_SITE_OTHER): Payer: Medicare Other

## 2022-03-28 ENCOUNTER — Ambulatory Visit (INDEPENDENT_AMBULATORY_CARE_PROVIDER_SITE_OTHER): Payer: Medicare Other | Admitting: Physician Assistant

## 2022-03-28 ENCOUNTER — Encounter: Payer: Self-pay | Admitting: Orthopaedic Surgery

## 2022-03-28 DIAGNOSIS — R202 Paresthesia of skin: Secondary | ICD-10-CM | POA: Diagnosis not present

## 2022-03-28 MED ORDER — PREDNISONE 10 MG (21) PO TBPK: ORAL_TABLET | ORAL | 0 refills | Status: DC

## 2022-03-28 MED ORDER — METHOCARBAMOL 750 MG PO TABS: 750.0000 mg | ORAL_TABLET | Freq: Two times a day (BID) | ORAL | 2 refills | Status: DC | PRN

## 2022-03-28 NOTE — Progress Notes (Signed)
Office Visit Note   Patient: [name]           Date of Birth: 1955/04/02           MRN: [id number] Visit Date: 03/28/2022              Requested by: Harrison Mons, [address],  [place] [id number] PCP: Harrison Mons, PA   Assessment & Plan: Visit Diagnoses:  1. Arm paresthesia, left   2. Arm paresthesia, right     Plan: Impression is chronic forearm and hand paresthesias.  It is somewhat hard to differentiate whether her symptoms are coming from possible carpal tunnel syndrome versus cervical spine radiculopathy.  I would like to go ahead and start her on a steroid pack and muscle relaxer and send her to physical therapy for her neck.  In the meantime, made referral to Dr. Ernestina Patches for bilateral upper extremity nerve conduction studies.  She will follow-up with [place] once these have been completed.  Call with concerns or questions.  Follow-Up Instructions: Return for f/u after NCS.   Orders:  Orders Placed This Encounter  Procedures   XR Cervical Spine 2 or 3 views   Ambulatory referral to Physical Therapy   Meds ordered this encounter  Medications   predniSONE (STERAPRED UNI-PAK 21 TAB) 10 MG (21) TBPK tablet    Sig: Take as directed    Dispense:  21 tablet    Refill:  0   methocarbamol (ROBAXIN-750) 750 MG tablet    Sig: Take 1 tablet (750 mg total) by mouth 2 (two) times daily as needed for muscle spasms.    Dispense:  20 tablet    Refill:  2      Procedures: No procedures performed   Clinical Data: No additional findings.   Subjective: Chief Complaint  Patient presents with   Right Leg - Pain   Left Leg - Pain    HPI patient is a pleasant 67 year old female who comes in today with bilateral arm tingling.  She notes that she first noticed an area to the dorsum of the right forearm in 2021 which began to tingle.  Soon after she noticed tingling sensations to both forearms and both hands.  Symptoms only occur when she is  lying down.  She does not have any symptoms otherwise.  She denies any weakness in her arms.  She does not take anything for pain or the paresthesias.  No history of neck pathology but she does notice pain in her neck when she wakes up in the morning.  Review of Systems as detailed in HPI.  All others reviewed and are negative.   Objective: Vital Signs: LMP 02/18/2004   Physical Exam well-developed well-nourished female no acute distress.  Alert and oriented x 3.  Ortho Exam bilateral arm exam was unremarkable.  Bilateral hand exam shows a negative Phalen and negative Tinel at the wrist and elbow.  No thenar atrophy.  She does have a large cyst over the volar forearm.  She is neurovascular intact distally.  Specialty Comments:  No specialty comments available.  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   Lower abdominal pain 12/11/2011   Copper deficiency 09/19/2011   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   Osteoporosis 09/19/2011   Numbness and tingling 09/19/2011   Weakness generalized 09/19/2011   Retinitis pigmentosa 09/19/2011   Age-related osteoporosis without current pathological fracture 09/19/2011   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   Past Medical History:  Diagnosis Date   Acute pain of right knee 03/07/2017   Anemia    Anorexia    Anxiety    Bitemporal hemianopsia    Borderline personality disorder ([place])    Bulimia     Depression    Hyperlipidemia    Ocular migraine    Osteoporosis    Pigmentary retinal dystrophy    Retinitis pigmentosa    TBI (traumatic brain injury) ([place])     Family History  Problem Relation Age of Onset   Polymyalgia rheumatica Mother    Osteoporosis Mother    Cancer Father    Heart disease Father        CHF   Diabetes Sister        Obesity   Stomach cancer Maternal Grandfather    Colon cancer Neg Hx    Esophageal cancer Neg Hx    Rectal cancer Neg Hx     Past Surgical History:  Procedure Laterality Date   CESAREAN SECTION     Social History   Occupational History   Occupation: Hotel manager education    Employer: [employer]   Occupation: Designer, fashion/clothing   Occupation: Contractor   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  Tobacco Use   Smoking status: Never   Smokeless tobacco: Never  Vaping Use   Vaping Use: Never used  Substance and Sexual Activity   Alcohol use: No   Drug use: No   Sexual activity: Not on file

## 2022-03-28 NOTE — Addendum Note (Signed)
Addended by: Lendon Collar on: 03/28/2022 12:47 PM   Modules accepted: Orders

## 2022-03-29 ENCOUNTER — Ambulatory Visit: Payer: Medicare Other | Admitting: Psychology

## 2022-03-30 NOTE — Telephone Encounter (Signed)
Left message for pt to call back  °

## 2022-03-30 NOTE — Telephone Encounter (Signed)
Pt made aware of Janice Best NP recommendations. Pt stated that the FD guard is not helping any.  Pt stated that she does not have an Appt with [facility] and that's not something she wants to pursue at this time Pt questioned if Dr. Silverio Decamp thinks that it is reasonable to do an Upper Endoscopy on her. Please advise

## 2022-03-30 NOTE — Telephone Encounter (Signed)
Defer further recommendations to Dr. Silverio Decamp.

## 2022-03-31 ENCOUNTER — Telehealth: Payer: Self-pay | Admitting: Physical Medicine and Rehabilitation

## 2022-03-31 NOTE — Telephone Encounter (Signed)
LVM to return call.

## 2022-03-31 NOTE — Telephone Encounter (Signed)
Patient called. Returning a call to [place] to schedule with Dr. Ernestina Patches

## 2022-04-03 NOTE — Therapy (Incomplete)
OUTPATIENT PHYSICAL THERAPY EVALUATION   Patient Name: Moneisha Nostrant MRN: [id number] DOB:[birth year], 67 y.o., female Today's Date: 04/03/2022  END OF SESSION:   Past Medical History:  Diagnosis Date   Acute pain of right knee 03/07/2017   Anemia    Anorexia    Anxiety    Bitemporal hemianopsia    Borderline personality disorder ([place])    Bulimia    Depression    Hyperlipidemia    Ocular migraine    Osteoporosis    Pigmentary retinal dystrophy    Retinitis pigmentosa    TBI (traumatic brain injury) [facility])    Past Surgical History:  Procedure Laterality Date   CESAREAN SECTION     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[place]) 03/13/2014   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  Rationale for Evaluation and Treatment: Rehabilitation  ONSET DATE: ***  SUBJECTIVE:                                                                                                                                                                                                         SUBJECTIVE  STATEMENT: ***  PERTINENT HISTORY:  Depression, hyperlipidemia, osteoporosis, eating disorders in medical history, anemia  PAIN:  NPRS scale: ***/10 Pain location: *** Pain description: *** Aggravating factors: *** Relieving factors: ***  PRECAUTIONS: None  WEIGHT BEARING RESTRICTIONS: No  FALLS:  Has patient fallen in last 6 months? No  LIVING ENVIRONMENT: Lives with: {OPRC lives with:25569::"lives with their family"} Lives in: {Lives in:25570} Stairs: {opstairs:27293} Has following equipment at home: {Assistive devices:23999}  OCCUPATION: ***  PLOF: Independent  PATIENT GOALS: ***  Next MD visit:  OBJECTIVE:   PATIENT SURVEYS:  04/04/2022 FOTO intake:    predicted:    COGNITION: 04/04/2022 Overall cognitive status: Within  functional limits for tasks assessed  SENSATION: 04/04/2022 {sensation:27233}  POSTURE: 04/04/2022 {posture:25561}  PALPATION: 04/04/2022 ***   CERVICAL ROM:   ROM AROM (deg) 04/04/2022  Flexion   Extension   Right lateral flexion   Left lateral flexion   Right rotation   Left rotation    (Blank rows = not tested)  UPPER EXTREMITY ROM:  {AROM/PROM:27142} ROM Right 04/04/2022 Left 04/04/2022  Shoulder flexion    Shoulder extension    Shoulder abduction    Shoulder adduction    Shoulder extension    Shoulder internal rotation    Shoulder external rotation    Elbow flexion    Elbow extension    Wrist flexion    Wrist extension    Wrist ulnar deviation    Wrist radial deviation    Wrist pronation    Wrist supination     (Blank rows = not tested)  UPPER EXTREMITY MMT:  MMT Right 04/04/2022 Left 04/04/2022  Shoulder flexion    Shoulder extension    Shoulder abduction    Shoulder adduction    Shoulder extension    Shoulder internal rotation    Shoulder external rotation    Middle trapezius    Lower trapezius    Elbow flexion    Elbow extension    Wrist flexion    Wrist extension    Wrist ulnar deviation     Wrist radial deviation    Wrist pronation    Wrist supination    Grip strength    (Blank rows = not tested)  CERVICAL SPECIAL TESTS:  04/04/2022 {Cervical special tests:25246}  FUNCTIONAL TESTS:  04/04/2022 {Functional tests:24029}   TODAY'S TREATMENT:                                                                   DATE: 04/04/2022 Therex:    HEP instruction/performance c cues for techniques, handout provided.  Trial set performed of each for comprehension and symptom assessment.  See below for exercise list  PATIENT EDUCATION:  Education details: HEP, POC Person educated: Patient Education method: Explanation, Demonstration, Verbal cues, and Handouts Education comprehension: verbalized understanding, returned demonstration, and verbal cues required  HOME EXERCISE PROGRAM: ***  ASSESSMENT:  CLINICAL IMPRESSION: Patient is a 67 y.o. who comes to clinic with complaints of *** with mobility, strength and movement coordination deficits that impair their ability to perform usual daily and recreational functional activities without increase difficulty/symptoms at this time.  Patient to benefit from skilled PT services to address impairments and limitations to improve to previous level of function without restriction secondary to condition.    OBJECTIVE IMPAIRMENTS: {opptimpairments:25111}.   ACTIVITY LIMITATIONS: {activitylimitations:27494}  PARTICIPATION LIMITATIONS: {participationrestrictions:25113}  PERSONAL FACTORS:  Depression, hyperlipidemia, osteoporosis, eating disorders in medical history, anemia  are also affecting patient's functional outcome.   REHAB POTENTIAL: Good  CLINICAL DECISION MAKING: Stable/uncomplicated  EVALUATION COMPLEXITY: Low   GOALS: Goals reviewed with patient? Yes  SHORT TERM GOALS: (target date for Short term goals are 3 weeks 04/25/2022)  1.Patient will demonstrate independent use of home exercise program to maintain progress from in  clinic treatments. Goal status: New  LONG TERM GOALS: (target dates for all long term goals are 10 weeks  06/13/2022 )   1. Patient will demonstrate/report  pain at worst less than or equal to 2/10 to facilitate minimal limitation in daily activity secondary to pain symptoms. Goal status: New   2. Patient will demonstrate independent use of home exercise program to facilitate ability to maintain/progress functional gains from skilled physical therapy services. Goal status: New   3. Patient will demonstrate FOTO outcome > or = *** % to indicate reduced disability due to condition. Goal status: New   4.  Patient will demonstrate cervical AROM WFL s symptoms to facilitate usual head movements for daily activity including driving, self care.   Goal status: New   5.  ***  Goal status: New   6.  *** Goal status: New   7.  *** Goal Status: New   PLAN:  PT FREQUENCY: 1-2x/week  PT DURATION: 10 weeks  PLANNED INTERVENTIONS: Therapeutic exercises, Therapeutic activity, Neuro Muscular re-education, Balance training, Gait training, Patient/Family education, Joint mobilization, Stair training, DME instructions, Dry Needling, Electrical stimulation, Cryotherapy, vasopneumatic device,Traction, Moist heat, Taping, Ultrasound, Ionotophoresis 74m/ml Dexamethasone, and Manual therapy.  All included unless contraindicated  PLAN FOR NEXT SESSION: Check HEP use/response  MScot Jun PT, DPT, OCS, ATC 04/03/22  11:06 AM

## 2022-04-04 ENCOUNTER — Telehealth: Payer: Self-pay | Admitting: Physical Medicine and Rehabilitation

## 2022-04-04 ENCOUNTER — Ambulatory Visit: Payer: Medicare Other | Admitting: Rehabilitative and Restorative Service Providers"

## 2022-04-04 NOTE — Therapy (Incomplete)
OUTPATIENT PHYSICAL THERAPY EVALUATION   Patient Name: [name] MRN: [id number] DOB:08-Jan-1956, 67 y.o., female Today's Date: 04/04/2022  END OF SESSION:   Past Medical History:  Diagnosis Date   Acute pain of right knee 03/07/2017   Anemia    Anorexia    Anxiety    Bitemporal hemianopsia    Borderline personality disorder ([place])    Bulimia    Depression    Hyperlipidemia    Ocular migraine    Osteoporosis    Pigmentary retinal dystrophy    Retinitis pigmentosa    TBI (traumatic brain injury) [facility])    Past Surgical History:  Procedure Laterality Date   CESAREAN SECTION     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[place]) 03/13/2014   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  Rationale for Evaluation and Treatment: Rehabilitation  ONSET DATE: ***  SUBJECTIVE:                                                                                                                                                                                                         SUBJECTIVE  STATEMENT: ***  PERTINENT HISTORY:  Depression, hyperlipidemia, osteoporosis, eating disorders in medical history, anemia  PAIN:  NPRS scale: ***/10 Pain location: *** Pain description: *** Aggravating factors: *** Relieving factors: ***  PRECAUTIONS: None  WEIGHT BEARING RESTRICTIONS: No  FALLS:  Has patient fallen in last 6 months? No  LIVING ENVIRONMENT: Lives with: {OPRC lives with:25569::"lives with their family"} Lives in: {Lives in:25570} Stairs: {opstairs:27293} Has following equipment at home: {Assistive devices:23999}  OCCUPATION: ***  PLOF: Independent  PATIENT GOALS: ***  Next MD visit:  OBJECTIVE:   PATIENT SURVEYS:  04/04/2022 FOTO intake:    predicted:    COGNITION: 04/04/2022 Overall cognitive status: Within  functional limits for tasks assessed  SENSATION: 04/04/2022 {sensation:27233}  POSTURE: 04/04/2022 {posture:25561}  PALPATION: 04/04/2022 ***   CERVICAL ROM:   ROM AROM (deg) 04/04/2022  Flexion   Extension   Right lateral flexion   Left lateral flexion   Right rotation   Left rotation    (Blank rows = not tested)  UPPER EXTREMITY ROM:  {AROM/PROM:27142} ROM Right 04/04/2022 Left 04/04/2022  Shoulder flexion    Shoulder extension    Shoulder abduction    Shoulder adduction    Shoulder extension    Shoulder internal rotation    Shoulder external rotation    Elbow flexion    Elbow extension    Wrist flexion    Wrist extension    Wrist ulnar deviation    Wrist radial deviation    Wrist pronation    Wrist supination     (Blank rows = not tested)  UPPER EXTREMITY MMT:  MMT Right 04/04/2022 Left 04/04/2022  Shoulder flexion    Shoulder extension    Shoulder abduction    Shoulder adduction    Shoulder extension    Shoulder internal rotation    Shoulder external rotation    Middle trapezius    Lower trapezius    Elbow flexion    Elbow extension    Wrist flexion    Wrist extension    Wrist ulnar deviation     Wrist radial deviation    Wrist pronation    Wrist supination    Grip strength    (Blank rows = not tested)  CERVICAL SPECIAL TESTS:  04/04/2022 {Cervical special tests:25246}  FUNCTIONAL TESTS:  04/04/2022 {Functional tests:24029}   TODAY'S TREATMENT:                                                                   DATE: 04/04/2022 Therex:    HEP instruction/performance c cues for techniques, handout provided.  Trial set performed of each for comprehension and symptom assessment.  See below for exercise list  PATIENT EDUCATION:  Education details: HEP, POC Person educated: Patient Education method: Explanation, Demonstration, Verbal cues, and Handouts Education comprehension: verbalized understanding, returned demonstration, and verbal cues required  HOME EXERCISE PROGRAM: ***  ASSESSMENT:  CLINICAL IMPRESSION: Patient is a 67 y.o. who comes to clinic with complaints of *** with mobility, strength and movement coordination deficits that impair their ability to perform usual daily and recreational functional activities without increase difficulty/symptoms at this time.  Patient to benefit from skilled PT services to address impairments and limitations to improve to previous level of function without restriction secondary to condition.    OBJECTIVE IMPAIRMENTS: {opptimpairments:25111}.   ACTIVITY LIMITATIONS: {activitylimitations:27494}  PARTICIPATION LIMITATIONS: {participationrestrictions:25113}  PERSONAL FACTORS:  Depression, hyperlipidemia, osteoporosis, eating disorders in medical history, anemia  are also affecting patient's functional outcome.   REHAB POTENTIAL: Good  CLINICAL DECISION MAKING: Stable/uncomplicated  EVALUATION COMPLEXITY: Low   GOALS: Goals reviewed with patient? Yes  SHORT TERM GOALS: (target date for Short term goals are 3 weeks 04/25/2022)  1.Patient will demonstrate independent use of home exercise program to maintain progress from in  clinic treatments. Goal status: New  LONG TERM GOALS: (target dates for all long term goals are 10 weeks  06/13/2022 )   1. Patient will demonstrate/report  pain at worst less than or equal to 2/10 to facilitate minimal limitation in daily activity secondary to pain symptoms. Goal status: New   2. Patient will demonstrate independent use of home exercise program to facilitate ability to maintain/progress functional gains from skilled physical therapy services. Goal status: New   3. Patient will demonstrate FOTO outcome > or = *** % to indicate reduced disability due to condition. Goal status: New   4.  Patient will demonstrate cervical AROM WFL s symptoms to facilitate usual head movements for daily activity including driving, self care.   Goal status: New   5.  ***  Goal status: New   6.  *** Goal status: New   7.  *** Goal Status: New   PLAN:  PT FREQUENCY: 1-2x/week  PT DURATION: 10 weeks  PLANNED INTERVENTIONS: Therapeutic exercises, Therapeutic activity, Neuro Muscular re-education, Balance training, Gait training, Patient/Family education, Joint mobilization, Stair training, DME instructions, Dry Needling, Electrical stimulation, Cryotherapy, vasopneumatic device,Traction, Moist heat, Taping, Ultrasound, Ionotophoresis 17m/ml Dexamethasone, and Manual therapy.  All included unless contraindicated  PLAN FOR NEXT SESSION: Check HEP use/response  MScot Jun PT, DPT, OCS, ATC 04/04/22  9:03 AM

## 2022-04-04 NOTE — Telephone Encounter (Signed)
Patient called. Returning a call to schedule with Dr. Newton.  ?

## 2022-04-04 NOTE — Telephone Encounter (Signed)
Spoke with patient and scheduled NCV for 04/07/22

## 2022-04-05 ENCOUNTER — Ambulatory Visit (INDEPENDENT_AMBULATORY_CARE_PROVIDER_SITE_OTHER): Payer: Medicare Other | Admitting: Psychology

## 2022-04-05 DIAGNOSIS — F509 Eating disorder, unspecified: Secondary | ICD-10-CM | POA: Diagnosis not present

## 2022-04-05 DIAGNOSIS — F4310 Post-traumatic stress disorder, unspecified: Secondary | ICD-10-CM | POA: Diagnosis not present

## 2022-04-05 DIAGNOSIS — F603 Borderline personality disorder: Secondary | ICD-10-CM | POA: Diagnosis not present

## 2022-04-05 DIAGNOSIS — F411 Generalized anxiety disorder: Secondary | ICD-10-CM | POA: Diagnosis not present

## 2022-04-05 NOTE — Progress Notes (Signed)
[facility] Counselor/Therapist Progress Note  Patient ID: [name], MRN: [id number],    Date: 04/05/2022  Time Spent: 30 minutes  Treatment Type: Individual Therapy  Reported Symptoms: dissociation, panic attacks, anxiety  Mental Status Exam: Appearance:  Casual     Behavior: Attention-Seeking  Motor: Restlestness  Speech/Language:  Normal Rate  Affect: Blunt  Mood: pleasant  Thought process: concrete  Thought content:   Questionable delusions  Sensory/Perceptual disturbances:   WNL  Orientation: oriented to person, place, time/date, and situation  Attention: Poor  Concentration: Fair  Memory: [place] of knowledge:  Good  Insight:   Poor  Judgment:  Poor  Impulse Control: Fair   Risk Assessment: Danger to Self:  No Self-injurious Behavior: No Danger to Others: No Duty to Warn:no Physical Aggression / Violence:No  Access to Firearms a concern: No  Gang Involvement:No   Subjective: The patient attended a face-to-face individual therapy session via video visit today.  The patient gave consent for her session to be on webex.  The patient was in her home alone and the therapist was in the office.  The patient reports that she is not feeling well today and that she tested but tested negative for COVID.  We talked about her making the right decision not to come into the office today and also the right decision not to go to work today.  The patient was questioning her decisions and I explained that it was good for her not to go because she could have gotten someone sick or gotten worse herself.  The patient continues to say that she does not always feel like she is real and that she is having anxiety.  I redirected her and told her that because she has felt better before she will continue to feel better moving forward.  The patient states that she is going to order some groceries and we did not get to talk for the full session today because she did not feel  well.  She states that she will come back next week. Interventions: Cognitive Behavioral Therapy, Dialectical Behavioral Therapy, Solution-Oriented/Positive Psychology, Eye Movement Desensitization and Reprocessing (EMDR), and Insight-Oriented  Diagnosis:Borderline personality disorder in adult [facility])  GAD (generalized anxiety disorder)  Eating disorder with ongoing treatment  PTSD (post-traumatic stress disorder)  Plan: Treatment Plan Client Abilities/Strengths  Intelligent, kind, motivated  Client Treatment Preferences  Outpatient Trauma therapy  Client Statement of Needs  "I need some  help dealing with my anxiety and PTSD" Treatment Level  Outpatient Individual therapy  Symptoms  Describes a reliving of the event, particularly through dissociative flashbacks.:  (Status: maintained). Displays a significant decline in interest and engagement in activities.:  (Status: maintained). Displays significant psychological and/or physiological  distress resulting from internal and external clues that are reminiscent of the traumatic event.: (Status: maintained). Experiences disturbances in sleep.:   (Status: maintained). Experiences disturbing and persistent thoughts, images, and/or perceptions of the  traumatic event.: (Status: maintained). Experiences frequent nightmares.: No  Description Entered (Status: improved). Has been exposed to a traumatic event involving actual or  perceived threat of death or serious injury.:  (Status: maintained).  Hypervigilance (e.g., feeling constantly on edge, experiencing concentration difficulties, having trouble falling or staying asleep, exhibiting a general state of irritability).:  (Status:  maintained). Impairment in social, occupational, or other areas of functioning.:  (Status: maintained). Intentionally avoids activities, places, people, or objects (e.g., up-armored vehicles) that evoke memories of the event.:  (Status: maintained). Intentionally  avoids  thoughts, feelings, or discussions related to the traumatic event.:  (Status:  maintained). Reports difficulty concentrating as well as feelings of guilt.:   (Status: maintained). Reports response of intense fear, helplessness, or horror to the traumatic event.:  (Status: maintained). Symptoms present more than one month.: (Status: maintained).   Problems Addressed  Anxiety, Posttraumatic Stress Disorder (PTSD), Posttraumatic Stress Disorder (PTSD), Anxiety,  Posttraumatic Stress Disorder (PTSD)  Goals 1. Enhance ability to effectively cope with the full variety of life's worries  and anxieties. Objective Identify and engage in pleasant activities on a daily basis. Target Date: 2022-12-24 Frequency: weekly Progress: 0 Modality: individual Objective Identify, challenge, and replace biased, fearful self-talk with positive, realistic, and empowering selftalk. Target Date: 2022-12-24 Frequency: weekly Progress: 0 Modality: individual Related Interventions 1. Assign the client a homework exercise in which he/she identifies fearful self-talk, identifies  biases in the self-talk, generates alternatives, and tests through behavioral experiments (or  assign "Negative Thoughts Trigger Negative Feelings" in the Adult Psychotherapy Homework  Planner by [facility]); review and reinforce success, providing corrective feedback toward  improvement. 2. Explore the client's schema and self-talk that mediate his/her fear response; assist him/her in  challenging the biases; replace the distorted messages with reality-based alternatives and  positive, realistic self-talk that will increase his/her self-confidence in coping with irrational  fears (see Cognitive Therapy of Anxiety Disorders by Alison Stalling).  2. No longer avoids persons, places, activities, and objects that are  reminiscent of the traumatic event. 3. No longer experiences intrusive event recollections, avoidance of event  reminders,  intense arousal, or disinterest in activities or  relationships. 4. Stabilize anxiety level while increasing ability to function on a daily  basis. 5. Thinks about or openly discusses the traumatic event with others  without experiencing psychological or physiological distress. Objective Participate in Cognitive Therapy to help identify, challenge, and replace biased, negative, and selfdefeating thoughts resulting from the trauma. Target Date: 2022-12-24 Frequency: weekly Progress: 0 Modality: individual Related Interventions 1. Using Cognitive Therapy techniques, explore the client's self-talk and beliefs about self, others,  and the future that are a consequence of the trauma (e.g., themes of safety, trust, power, control,  esteem, and intimacy); identify and challenge biases; assist him/her in generating appraisals that correct for the biases; test biased and alternatives predictions through behavioral experiments. Objective Participate in Eye Movement Desensitization and Reprocessing (EMDR) to reduce emotional distress  related to traumatic thoughts, feelings, and images. Target Date: 2022-12-24 Frequency: weekly Progress: 0 Modality: individual Related Interventions 1. Utilize Eye Movement Desensitization and Reprocessing (EMDR) to reduce the client's  emotional reactivity to the traumatic event and reduce PTSD symptoms.  Diagnosis  F43.10 (Posttraumatic stress disorder) - Open - [Signifier: n/a] Posttraumatic Stress  Disorder  Conditions For Discharge Achievement of treatment goals and objectives   Zayd Bonet G Sathvika Ojo, LCSW

## 2022-04-07 ENCOUNTER — Ambulatory Visit (INDEPENDENT_AMBULATORY_CARE_PROVIDER_SITE_OTHER): Payer: Medicare Other | Admitting: Physical Medicine and Rehabilitation

## 2022-04-07 DIAGNOSIS — M542 Cervicalgia: Secondary | ICD-10-CM

## 2022-04-07 DIAGNOSIS — M79602 Pain in left arm: Secondary | ICD-10-CM

## 2022-04-07 DIAGNOSIS — R202 Paresthesia of skin: Secondary | ICD-10-CM | POA: Diagnosis not present

## 2022-04-07 DIAGNOSIS — M79601 Pain in right arm: Secondary | ICD-10-CM

## 2022-04-07 NOTE — Progress Notes (Signed)
Functional Pain Scale - descriptive words and definitions  No Pain (0)   No Pain/Loss of function  Average Pain 0  Right handed. Both arms tingle down to the hands

## 2022-04-12 ENCOUNTER — Ambulatory Visit (INDEPENDENT_AMBULATORY_CARE_PROVIDER_SITE_OTHER): Payer: Medicare Other | Admitting: Psychology

## 2022-04-12 DIAGNOSIS — F4310 Post-traumatic stress disorder, unspecified: Secondary | ICD-10-CM | POA: Diagnosis not present

## 2022-04-12 DIAGNOSIS — F411 Generalized anxiety disorder: Secondary | ICD-10-CM

## 2022-04-12 DIAGNOSIS — F603 Borderline personality disorder: Secondary | ICD-10-CM | POA: Diagnosis not present

## 2022-04-12 DIAGNOSIS — F509 Eating disorder, unspecified: Secondary | ICD-10-CM

## 2022-04-12 NOTE — Progress Notes (Signed)
[place] [facility] Counselor/Therapist Progress Note  Patient ID: [name], MRN: [id number],    Date: 04/12/2022  Time Spent: 60 minutes  Treatment Type: Individual Therapy  Reported Symptoms: dissociation, panic attacks, anxiety  Mental Status Exam: Appearance:  Casual     Behavior: Attention-Seeking  Motor: Restlestness  Speech/Language:  Normal Rate  Affect: Blunt  Mood: pleasant  Thought process: concrete  Thought content:   Questionable delusions  Sensory/Perceptual disturbances:   WNL  Orientation: oriented to person, place, time/date, and situation  Attention: Poor  Concentration: Fair  Memory: [place] of knowledge:  Good  Insight:   Poor  Judgment:  Poor  Impulse Control: Fair   Risk Assessment: Danger to Self:  No Self-injurious Behavior: No Danger to Others: No Duty to Warn:no Physical Aggression / Violence:No  Access to Firearms a concern: No  Gang Involvement:No   Subjective: The patient attended a face-to-face individual therapy session in the office today.  The patient reports that she feels a lot better physically.  The patient states that she went to her PCP and talked to her PCP about her dissociation and also her anxiety.  The PCP said that she was going to refer her to a neuropsychologist for testing and also for the genetic testing to see which medications might help her.  She also had an appointment with [facility] PA today for medication evaluation.  The patient was a little argumentative today when we were talking about she may find out that her circumstances related to her own anxiety and her own thoughts.  She is looking for someone externally to make her feel better and she wants validation externally.  I keep trying to work with her on how does she validate herself internally for what she is accomplishing.  I recommended that she write down some alternate thoughts besides the negative thoughts that she tends to focus  on.  Interventions: Cognitive Behavioral Therapy, Dialectical Behavioral Therapy, Solution-Oriented/Positive Psychology, Eye Movement Desensitization and Reprocessing (EMDR), and Insight-Oriented  Diagnosis:Borderline personality disorder in adult [facility])  GAD (generalized anxiety disorder)  Eating disorder with ongoing treatment  PTSD (post-traumatic stress disorder)  Plan: Treatment Plan Client Abilities/Strengths  Intelligent, kind, motivated  Client Treatment Preferences  Outpatient Trauma therapy  Client Statement of Needs  "I need some  help dealing with my anxiety and PTSD" Treatment Level  Outpatient Individual therapy  Symptoms  Describes a reliving of the event, particularly through dissociative flashbacks.:  (Status: maintained). Displays a significant decline in interest and engagement in activities.:  (Status: maintained). Displays significant psychological and/or physiological  distress resulting from internal and external clues that are reminiscent of the traumatic event.: (Status: maintained). Experiences disturbances in sleep.:   (Status: maintained). Experiences disturbing and persistent thoughts, images, and/or perceptions of the  traumatic event.: (Status: maintained). Experiences frequent nightmares.: No  Description Entered (Status: improved). Has been exposed to a traumatic event involving actual or  perceived threat of death or serious injury.:  (Status: maintained).  Hypervigilance (e.g., feeling constantly on edge, experiencing concentration difficulties, having trouble falling or staying asleep, exhibiting a general state of irritability).:  (Status:  maintained). Impairment in social, occupational, or other areas of functioning.:  (Status: maintained). Intentionally avoids activities, places, people, or objects (e.g., up-armored vehicles) that evoke memories of the event.:  (Status: maintained). Intentionally avoids  thoughts, feelings, or discussions  related to the traumatic event.:  (Status:  maintained). Reports difficulty concentrating as well as feelings of guilt.:   (Status:  maintained). Reports response of intense fear, helplessness, or horror to the traumatic event.:  (Status: maintained). Symptoms present more than one month.: (Status: maintained).   Problems Addressed  Anxiety, Posttraumatic Stress Disorder (PTSD), Posttraumatic Stress Disorder (PTSD), Anxiety,  Posttraumatic Stress Disorder (PTSD)  Goals 1. Enhance ability to effectively cope with the full variety of life's worries  and anxieties. Objective Identify and engage in pleasant activities on a daily basis. Target Date: 2022-12-24 Frequency: weekly Progress: 0 Modality: individual Objective Identify, challenge, and replace biased, fearful self-talk with positive, realistic, and empowering selftalk. Target Date: 2022-12-24 Frequency: weekly Progress: 0 Modality: individual Related Interventions 1. Assign the client a homework exercise in which he/she identifies fearful self-talk, identifies  biases in the self-talk, generates alternatives, and tests through behavioral experiments (or  assign "Negative Thoughts Trigger Negative Feelings" in the Adult Psychotherapy Homework  Planner by [facility]); review and reinforce success, providing corrective feedback toward  improvement. 2. Explore the client's schema and self-talk that mediate his/her fear response; assist him/her in  challenging the biases; replace the distorted messages with reality-based alternatives and  positive, realistic self-talk that will increase his/her self-confidence in coping with irrational  fears (see Cognitive Therapy of Anxiety Disorders by Alison Stalling).  2. No longer avoids persons, places, activities, and objects that are  reminiscent of the traumatic event. 3. No longer experiences intrusive event recollections, avoidance of event  reminders, intense arousal, or disinterest in  activities or  relationships. 4. Stabilize anxiety level while increasing ability to function on a daily  basis. 5. Thinks about or openly discusses the traumatic event with others  without experiencing psychological or physiological distress. Objective Participate in Cognitive Therapy to help identify, challenge, and replace biased, negative, and selfdefeating thoughts resulting from the trauma. Target Date: 2022-12-24 Frequency: weekly Progress: 0 Modality: individual Related Interventions 1. Using Cognitive Therapy techniques, explore the client's self-talk and beliefs about self, others,  and the future that are a consequence of the trauma (e.g., themes of safety, trust, power, control,  esteem, and intimacy); identify and challenge biases; assist him/her in generating appraisals that correct for the biases; test biased and alternatives predictions through behavioral experiments. Objective Participate in Eye Movement Desensitization and Reprocessing (EMDR) to reduce emotional distress  related to traumatic thoughts, feelings, and images. Target Date: 2022-12-24 Frequency: weekly Progress: 0 Modality: individual Related Interventions 1. Utilize Eye Movement Desensitization and Reprocessing (EMDR) to reduce the client's  emotional reactivity to the traumatic event and reduce PTSD symptoms.  Diagnosis  F43.10 (Posttraumatic stress disorder) - Open - [Signifier: n/a] Posttraumatic Stress  Disorder  Conditions For Discharge Achievement of treatment goals and objectives   Miryah Ralls G Ashani Pumphrey, LCSW

## 2022-04-13 ENCOUNTER — Encounter: Payer: Self-pay | Admitting: Physical Therapy

## 2022-04-13 ENCOUNTER — Other Ambulatory Visit: Payer: Self-pay

## 2022-04-13 ENCOUNTER — Ambulatory Visit (INDEPENDENT_AMBULATORY_CARE_PROVIDER_SITE_OTHER): Payer: Medicare Other | Admitting: Physical Therapy

## 2022-04-13 DIAGNOSIS — M25532 Pain in left wrist: Secondary | ICD-10-CM

## 2022-04-13 DIAGNOSIS — M542 Cervicalgia: Secondary | ICD-10-CM | POA: Diagnosis not present

## 2022-04-13 DIAGNOSIS — M25531 Pain in right wrist: Secondary | ICD-10-CM

## 2022-04-13 DIAGNOSIS — M6281 Muscle weakness (generalized): Secondary | ICD-10-CM | POA: Diagnosis not present

## 2022-04-13 DIAGNOSIS — M5459 Other low back pain: Secondary | ICD-10-CM

## 2022-04-13 NOTE — Therapy (Addendum)
OUTPATIENT PHYSICAL THERAPY CERVICAL EVALUATION /DISCHARGE   Patient Name: [name] MRN: [id number] DOB:[birth year], 67 y.o., female Today's Date: 04/13/2022  END OF SESSION:  PT End of Session - 04/13/22 1458     Visit Number 1    Number of Visits 2    Date for PT Re-Evaluation 05/11/22    Authorization Type MCR    Progress Note Due on Visit 10    PT Start Time 1349    PT Stop Time 1430    PT Time Calculation (min) 41 min    Activity Tolerance Patient tolerated treatment well    Behavior During Therapy [facility] for tasks assessed/performed             Past Medical History:  Diagnosis Date   Acute pain of right knee 03/07/2017   Anemia    Anorexia    Anxiety    Bitemporal hemianopsia    Borderline personality disorder (HCC)    Bulimia    Depression    Hyperlipidemia    Ocular migraine    Osteoporosis    Pigmentary retinal dystrophy    Retinitis pigmentosa    TBI (traumatic brain injury) [facility])    Past Surgical History:  Procedure Laterality Date   CESAREAN SECTION     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HCC) 03/13/2014   Bitemporal hemianopsia 08/11/2013   Lower abdominal pain 12/11/2011   Copper deficiency 09/19/2011   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   Osteoporosis 09/19/2011   Numbness and tingling 09/19/2011   Weakness generalized 09/19/2011   Retinitis pigmentosa 09/19/2011   Age-related osteoporosis without current pathological fracture 09/19/2011   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 She said she has had this problem for several years, but it has gotten worse over the last few months. The tingling in her hands only comes on after sleeping and when she stands up for the day it goes away. There are no other positions that cause the tingling to come on. Currently, her main concern is her low back on the left side that is causing her the most pain.    PERTINENT HISTORY:  PMH: Depression, hyperlipidemia, osteoporosis, eating disorders in medical history, anemia  PAIN:  Are you having pain? Yes: NPRS scale: "mild" currently for neck ad /10 Pain location: tingling on palmar forearm and dorsal side of hands, neck pain lower cervical spine, left sided low back pain Pain description: sore in neck, tingling in hands Aggravating factors: sleeping for tingling in hands and neck pain/ bending, squatting, and changing positions for low back  Relieving factors: standing up for tingling and stretching for low back   PRECAUTIONS: None  WEIGHT BEARING RESTRICTIONS: No  FALLS:  Has patient fallen in last 6 months? 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    a mild to moderate left median nerve entrapment at the wrist (carpal tunnel syndrome) affecting sensory components.   There is no significant electrodiagnostic evidence of any other focal nerve entrapment, brachial plexopathy or cervical radiculopathy.  PATIENT SURVEYS:  FOTO 75% goal 81%  COGNITION: Overall cognitive status: Within functional limits for tasks assessed  POSTURE: forward head   CERVICAL ROM:   Active ROM A/PROM (deg) eval  Flexion 50  Extension 30  Right lateral flexion 40  Left lateral flexion 30  Right rotation 65  Left rotation 55   (Blank rows = not tested)  UPPER EXTREMITY ROM:  Active ROM Right eval Left eval  Shoulder flexion [facility]  Shoulder extension    Shoulder abduction [facility]  Shoulder adduction    Shoulder extension    Shoulder internal rotation WFL T1*  Shoulder external rotation [facility]   Elbow flexion    Elbow extension    Wrist flexion    Wrist extension    Wrist ulnar deviation    Wrist radial deviation    Wrist pronation    Wrist supination    Hip flexion [facility] WFL*  Lumbar ext [facility]   Lumbar flex (Repeated lumbar extensions improved pain) Mild limitation*    (Blank rows = not tested)   UPPER EXTREMITY MMT:  MMT Right eval Left eval  Shoulder flexion 4+ 4+  Shoulder extension    Shoulder abduction 4+ 4  Shoulder adduction    Shoulder extension    Shoulder internal rotation 5 5  Shoulder external rotation 4+ 4+  Middle trapezius    Lower trapezius    Elbow flexion 5 4+  Elbow extension 5 4+  Wrist flexion 4+ 4+  Wrist extension 4+ 4  Wrist ulnar deviation    Wrist radial  deviation    Wrist pronation    Wrist supination    Grip  strength 4+ 4  Hip flexion  4+ 4+   (Blank rows = not tested)  CERVICAL SPECIAL TESTS:   ULLT Median -- negative ULLT Radial -- negative    Spurlings -- negative   TODAY'S TREATMENT:  Eval HEP creation and review with demonstration and trial set preformed, see below for details  Moist heat pack applied to lumbar spine x10 min  PATIENT EDUCATION: Education details: HEP, PT plan of care Person educated: Patient Education method: Explanation, Demonstration, Verbal cues, and Handouts Education comprehension: verbalized understanding and needs further education   HOME EXERCISE PROGRAM: Access Code: [email or web address] URL: [email or web address] Date: 04/13/2022 Prepared by: Elsie Ra  Exercises - Seated Upper Trapezius Stretch  - 2 x daily - 6 x weekly - 1 sets - 2-3 reps - 30 sec hold - Seated Assisted Cervical Rotation with Towel  - 2 x daily - 6 x weekly - 1 sets - 10 reps - 5 hold - Cervical Extension AROM with Strap  - 2 x daily - 6 x weekly - 1 sets - 10 reps - 5 sec hold - Seated Scapular Retraction  - 2 x daily - 6 x weekly - 2 sets - 10 reps - 5 sec hold  ASSESSMENT:  CLINICAL IMPRESSION: Patient referred to PT for paraesthesia in right and left arm and possible cervical radiculopathy. Based on her presentation and description of signs and symptoms as well as recent nerve conduction study, radiculopathy is not suspected. Carpal tunnel seems to be the main cause of her numbness and tingling, which we could not reproduce symptoms, but she said she only ever gets tingling in the morning when waking up. For this we recommend some night splints to wear on both hands at night and she was provided information to purchase these at home. For her low back pain she responded well to repeated lumbar extensions so we provided her with exercises that were extension based. Overall she does show some cervical ROM limitations her strength overall is fair, but she does show some  decreased strength particularly on her left side. Patient will benefit from skilled PT to address these impairments and improve overall function.  OBJECTIVE IMPAIRMENTS: decreased activity tolerance, decreased shoulder mobility, decreased ROM, decreased strength, impaired flexibility, impaired UE use, postural dysfunction, and pain.  ACTIVITY LIMITATIONS: reaching, lifting, carry,  cleaning, driving, and or occupation  PERSONAL FACTORS:  PMH: anxiety, Depression, hyperlipidemia, osteoporosis, eating disorders in medical history, anemia also affecting patient's functional outcome.  REHAB POTENTIAL: Good  CLINICAL DECISION MAKING: Moderate/evolving  EVALUATION COMPLEXITY: Moderate    GOALS:  Long term PT goals Target date:05/11/2022   Pt will improve cervical AROM to [facility] to improve function Baseline: Goal status: New Pt will improve  Rt shoulder strength to at least 5-/5 MMT to improve functional strength Baseline: Goal status: New Pt will improve FOTO to at least 81% functional to show improved function Baseline: Goal status: New Pt will reduce pain to overall less than 3/10 with usual activity and work activity. Baseline: Goal status: New     5. Pt will reduce N/T in hands after 5/7 nights in a week to show improved funtion.   Baseline:   Goal status: New  PLAN: PT FREQUENCY: check up in 4 weeks  PT DURATION: 4 weeks  PLANNED INTERVENTIONS (unless contraindicated): aquatic PT, Canalith repositioning, cryotherapy, Electrical stimulation, Iontophoresis with 4 mg/ml dexamethasome, Moist heat, traction, Ultrasound,  gait training, Therapeutic exercise, balance training, neuromuscular re-education, patient/family education, prosthetic training, manual techniques, passive ROM, dry needling, taping, vasopnuematic device, vestibular, spinal manipulations, joint manipulations  PLAN FOR NEXT SESSION: review HEP, were these exercises beneficial? How were the wrist splints?     Kaspian Muccio, Student-PT 04/13/2022, 3:00 PM  Ivery Quale, PT, DPT 04/13/22 4:15 PM    PHYSICAL THERAPY DISCHARGE SUMMARY  Visits from Start of Care: 2  Current functional level related to goals / functional outcomes: See note   Remaining deficits: See note   Education / Equipment: HEP  Patient goals were not met. Patient is being discharged due to not returning since the last visit.  Chyrel Masson, PT, DPT, OCS, ATC 05/31/22  3:11 PM

## 2022-04-13 NOTE — Procedures (Signed)
EMG & NCV Findings: Evaluation of the right median motor nerve showed prolonged distal onset latency (4.5 ms) and decreased conduction velocity (Elbow-Wrist, 48 m/s).  The left median (across palm) sensory and the right median (across palm) sensory nerves showed no response (Palm) and prolonged distal peak latency (L4.3, R4.6 ms).  The left ulnar sensory and the right ulnar sensory nerves showed prolonged distal peak latency (L4.3, R3.9 ms) and decreased conduction velocity (Wrist-5th Digit, L33, R36 m/s).  All remaining nerves (as indicated in the following tables) were within normal limits.  All left vs. right side differences were within normal limits.    All examined muscles (as indicated in the following table) showed no evidence of electrical instability.    Impression: The above electrodiagnostic study is ABNORMAL and reveals evidence of:   a moderate right median nerve entrapment at the wrist (carpal tunnel syndrome) affecting sensory and motor components.    a mild to moderate left median nerve entrapment at the wrist (carpal tunnel syndrome) affecting sensory components.  There is no significant electrodiagnostic evidence of any other focal nerve entrapment, brachial plexopathy or cervical radiculopathy.  As you know, this particular electrodiagnostic study cannot rule out chemical radiculitis or sensory only radiculopathy. **This electrodiagnostic study cannot rule out small fiber polyneuropathy and dysesthesias from central pain syndromes such as stroke or central pain sensitization syndromes such as fibromyalgia.  Myotomal referral pain from trigger points is also not excluded.  Recommendations: 1.  Follow-up with referring physician. 2.  Continue current management of symptoms. 3.  Continue use of resting splint at night-time and as needed during the day. 4.  Suggest possible right carpal tunnel diagnostic injection and possible surgical evaluation.  Clinical correlation is paramount  as this would not necessarily correspond to all of her symptoms.  ___________________________ Laurence Spates FAAPMR Board Certified, American Board of Physical Medicine and Rehabilitation    Nerve Conduction Studies Anti Sensory Summary Table   Stim Site NR Peak (ms) Norm Peak (ms) P-T Amp (V) Norm P-T Amp Site1 Site2 Delta-P (ms) Dist (cm) Vel (m/s) Norm Vel (m/s)  Left Median Acr Palm Anti Sensory (2nd Digit)  28.4C  Wrist    *4.3 <3.6 29.8 >10 Wrist Palm  0.0    Palm *NR  <2.0          Right Median Acr Palm Anti Sensory (2nd Digit)  33.4C  Wrist    *4.6 <3.6 36.1 >10 Wrist Palm  0.0    Palm *NR  <2.0          Left Radial Anti Sensory (Base 1st Digit)  28.7C  Wrist    2.4 <3.1 31.7  Wrist Base 1st Digit 2.4 0.0    Right Radial Anti Sensory (Base 1st Digit)  29.3C  Wrist    2.8 <3.1 27.6  Wrist Base 1st Digit 2.8 0.0    Left Ulnar Anti Sensory (5th Digit)  28.8C  Wrist    *4.3 <3.7 20.9 >15.0 Wrist 5th Digit 4.3 14.0 *33 >38  Right Ulnar Anti Sensory (5th Digit)  31.4C  Wrist    *3.9 <3.7 20.4 >15.0 Wrist 5th Digit 3.9 14.0 *36 >38   Motor Summary Table   Stim Site NR Onset (ms) Norm Onset (ms) O-P Amp (mV) Norm O-P Amp Site1 Site2 Delta-0 (ms) Dist (cm) Vel (m/s) Norm Vel (m/s)  Left Median Motor (Abd Poll Brev)  28.7C  Wrist    4.2 <4.2 8.7 >5 Elbow Wrist 3.7 19.0 51 >50  Elbow  7.9  8.7         Right Median Motor (Abd Poll Brev)  28.9C  Wrist    *4.5 <4.2 5.3 >5 Elbow Wrist 4.1 19.5 *48 >50  Elbow    8.6  5.5         Left Ulnar Motor (Abd Dig Min)  28.8C  Wrist    4.0 <4.2 9.5 >3 B Elbow Wrist 3.1 19.0 61 >53  B Elbow    7.1  9.6  A Elbow B Elbow 1.6 9.0 56 >53  A Elbow    8.7  9.3         Right Ulnar Motor (Abd Dig Min)  28.7C  Wrist    3.7 <4.2 10.6 >3 B Elbow Wrist 3.1 17.0 55 >53  B Elbow    6.8  11.7  A Elbow B Elbow 1.5 10.0 67 >53  A Elbow    8.3  11.4          EMG   Side Muscle Nerve Root Ins Act Fibs Psw Amp Dur Poly Recrt Int Fraser Din Comment  Right  Abd Poll Brev Median C8-T1 Nml Nml Nml Nml Nml 0 Nml Nml   Right 1stDorInt Ulnar C8-T1 Nml Nml Nml Nml Nml 0 Nml Nml   Right PronatorTeres Median C6-7 Nml Nml Nml Nml Nml 0 Nml Nml   Right Biceps Musculocut C5-6 Nml Nml Nml Nml Nml 0 Nml Nml   Right Deltoid Axillary C5-6 Nml Nml Nml Nml Nml 0 Nml Nml     Nerve Conduction Studies Anti Sensory Left/Right Comparison   Stim Site L Lat (ms) R Lat (ms) L-R Lat (ms) L Amp (V) R Amp (V) L-R Amp (%) Site1 Site2 L Vel (m/s) R Vel (m/s) L-R Vel (m/s)  Median Acr Palm Anti Sensory (2nd Digit)  28.4C  Wrist *4.3 *4.6 0.3 29.8 36.1 17.5 Wrist Palm     Palm             Radial Anti Sensory (Base 1st Digit)  28.7C  Wrist 2.4 2.8 0.4 31.7 27.6 12.9 Wrist Base 1st Digit     Ulnar Anti Sensory (5th Digit)  28.8C  Wrist *4.3 *3.9 0.4 20.9 20.4 2.4 Wrist 5th Digit *33 *36 3   Motor Left/Right Comparison   Stim Site L Lat (ms) R Lat (ms) L-R Lat (ms) L Amp (mV) R Amp (mV) L-R Amp (%) Site1 Site2 L Vel (m/s) R Vel (m/s) L-R Vel (m/s)  Median Motor (Abd Poll Brev)  28.7C  Wrist 4.2 *4.5 0.3 8.7 5.3 39.1 Elbow Wrist 51 *48 3  Elbow 7.9 8.6 0.7 8.7 5.5 36.8       Ulnar Motor (Abd Dig Min)  28.8C  Wrist 4.0 3.7 0.3 9.5 10.6 10.4 B Elbow Wrist 61 55 6  B Elbow 7.1 6.8 0.3 9.6 11.7 17.9 A Elbow B Elbow 56 67 11  A Elbow 8.7 8.3 0.4 9.3 11.4 18.4          Waveforms:

## 2022-04-13 NOTE — Addendum Note (Signed)
Addended by: Debbe Odea on: 04/13/2022 04:30 PM   Modules accepted: Orders

## 2022-04-14 NOTE — Progress Notes (Signed)
[name] - 67 y.o. female MRN [id number]  Date of birth: March 07, 1955  Office Visit Note: Visit Date: 04/07/2022 PCP: Harrison Mons, PA Referred by: Aundra Dubin, PA-C  Subjective: Chief Complaint  Patient presents with   Right Arm - Numbness   HPI: [name] is a 67 y.o. female who comes in today at the request of Tawanna Cooler, PA-C for evaluation and management of chronic, worsening and severe pain, numbness and tingling in the Bilateral upper extremities.  Patient is Right hand dominant.  She reports really not much in the way of pain but does get tingling in both arms and both hands in a nondermatomal fashion.  Initially seem to start in the right forearm and then gradually has had tingling into both arms and hands.  Cannot really differentiate necessarily different parts of the hand.  Does simply have somewhat more right than left.  Does have some neck pain.  She has been told that she could have tingling in symptoms from anxiety.  She has failed conservative care with medications and time.  She does get some arm pain and paresthesia but more paresthesia.  Has felt somewhat weak but no focal weakness.     Review of Systems  Musculoskeletal:  Positive for neck pain.  Neurological:  Positive for tingling and sensory change.  All other systems reviewed and are negative.  Otherwise per HPI.  Assessment & Plan: Visit Diagnoses:    ICD-10-CM   1. Paresthesia of skin  R20.2 NCV with EMG (electromyography)    2. Bilateral arm pain  M79.601    M79.602     3. Cervicalgia  M54.2        Plan: Impression: Clinically her symptoms really do not fit a specific pattern although statistically common would be carpal tunnel syndrome and then underlying pain syndrome more central sensitization syndrome modified by patient's known anxiety could give her symptoms that do not quite fit certain nerve patterns.  Less likely this is cervical radiculitis she does not really not  much in the way of pain but more paresthesia.  Electrodiagnostic study performed.  The above electrodiagnostic study is ABNORMAL and reveals evidence of:   a moderate right median nerve entrapment at the wrist (carpal tunnel syndrome) affecting sensory and motor components.    a mild to moderate left median nerve entrapment at the wrist (carpal tunnel syndrome) affecting sensory components.  There is no significant electrodiagnostic evidence of any other focal nerve entrapment, brachial plexopathy or cervical radiculopathy.  As you know, this particular electrodiagnostic study cannot rule out chemical radiculitis or sensory only radiculopathy. **This electrodiagnostic study cannot rule out small fiber polyneuropathy and dysesthesias from central pain syndromes such as stroke or central pain sensitization syndromes such as fibromyalgia.  Myotomal referral pain from trigger points is also not excluded.  Recommendations: 1.  Follow-up with referring physician. 2.  Continue current management of symptoms. 3.  Continue use of resting splint at night-time and as needed during the day. 4.  Suggest possible right carpal tunnel diagnostic injection and possible surgical evaluation.  Clinical correlation is paramount as this would not necessarily correspond to all of her symptoms.  Meds & Orders: No orders of the defined types were placed in this encounter.   Orders Placed This Encounter  Procedures   NCV with EMG (electromyography)    Follow-up: Return for Tawanna Cooler, PA-C.   Procedures: No procedures performed  EMG & NCV Findings: Evaluation of the right median motor nerve showed  prolonged distal onset latency (4.5 ms) and decreased conduction velocity (Elbow-Wrist, 48 m/s).  The left median (across palm) sensory and the right median (across palm) sensory nerves showed no response (Palm) and prolonged distal peak latency (L4.3, R4.6 ms).  The left ulnar sensory and the right ulnar sensory  nerves showed prolonged distal peak latency (L4.3, R3.9 ms) and decreased conduction velocity (Wrist-5th Digit, L33, R36 m/s).  All remaining nerves (as indicated in the following tables) were within normal limits.  All left vs. right side differences were within normal limits.    All examined muscles (as indicated in the following table) showed no evidence of electrical instability.    Impression: The above electrodiagnostic study is ABNORMAL and reveals evidence of:   a moderate right median nerve entrapment at the wrist (carpal tunnel syndrome) affecting sensory and motor components.    a mild to moderate left median nerve entrapment at the wrist (carpal tunnel syndrome) affecting sensory components.  There is no significant electrodiagnostic evidence of any other focal nerve entrapment, brachial plexopathy or cervical radiculopathy.  As you know, this particular electrodiagnostic study cannot rule out chemical radiculitis or sensory only radiculopathy. **This electrodiagnostic study cannot rule out small fiber polyneuropathy and dysesthesias from central pain syndromes such as stroke or central pain sensitization syndromes such as fibromyalgia.  Myotomal referral pain from trigger points is also not excluded.  Recommendations: 1.  Follow-up with referring physician. 2.  Continue current management of symptoms. 3.  Continue use of resting splint at night-time and as needed during the day. 4.  Suggest possible right carpal tunnel diagnostic injection and possible surgical evaluation.  Clinical correlation is paramount as this would not necessarily correspond to all of her symptoms.  ___________________________ Laurence Spates FAAPMR Board Certified, American Board of Physical Medicine and Rehabilitation    Nerve Conduction Studies Anti Sensory Summary Table   Stim Site NR Peak (ms) Norm Peak (ms) P-T Amp (V) Norm P-T Amp Site1 Site2 Delta-P (ms) Dist (cm) Vel (m/s) Norm Vel (m/s)  Left  Median Acr Palm Anti Sensory (2nd Digit)  28.4C  Wrist    *4.3 <3.6 29.8 >10 Wrist Palm  0.0    Palm *NR  <2.0          Right Median Acr Palm Anti Sensory (2nd Digit)  33.4C  Wrist    *4.6 <3.6 36.1 >10 Wrist Palm  0.0    Palm *NR  <2.0          Left Radial Anti Sensory (Base 1st Digit)  28.7C  Wrist    2.4 <3.1 31.7  Wrist Base 1st Digit 2.4 0.0    Right Radial Anti Sensory (Base 1st Digit)  29.3C  Wrist    2.8 <3.1 27.6  Wrist Base 1st Digit 2.8 0.0    Left Ulnar Anti Sensory (5th Digit)  28.8C  Wrist    *4.3 <3.7 20.9 >15.0 Wrist 5th Digit 4.3 14.0 *33 >38  Right Ulnar Anti Sensory (5th Digit)  31.4C  Wrist    *3.9 <3.7 20.4 >15.0 Wrist 5th Digit 3.9 14.0 *36 >38   Motor Summary Table   Stim Site NR Onset (ms) Norm Onset (ms) O-P Amp (mV) Norm O-P Amp Site1 Site2 Delta-0 (ms) Dist (cm) Vel (m/s) Norm Vel (m/s)  Left Median Motor (Abd Poll Brev)  28.7C  Wrist    4.2 <4.2 8.7 >5 Elbow Wrist 3.7 19.0 51 >50  Elbow    7.9  8.7  Right Median Motor (Abd Poll Brev)  28.9C  Wrist    *4.5 <4.2 5.3 >5 Elbow Wrist 4.1 19.5 *48 >50  Elbow    8.6  5.5         Left Ulnar Motor (Abd Dig Min)  28.8C  Wrist    4.0 <4.2 9.5 >3 B Elbow Wrist 3.1 19.0 61 >53  B Elbow    7.1  9.6  A Elbow B Elbow 1.6 9.0 56 >53  A Elbow    8.7  9.3         Right Ulnar Motor (Abd Dig Min)  28.7C  Wrist    3.7 <4.2 10.6 >3 B Elbow Wrist 3.1 17.0 55 >53  B Elbow    6.8  11.7  A Elbow B Elbow 1.5 10.0 67 >53  A Elbow    8.3  11.4          EMG   Side Muscle Nerve Root Ins Act Fibs Psw Amp Dur Poly Recrt Int Fraser Din Comment  Right Abd Poll Brev Median C8-T1 Nml Nml Nml Nml Nml 0 Nml Nml   Right 1stDorInt Ulnar C8-T1 Nml Nml Nml Nml Nml 0 Nml Nml   Right PronatorTeres Median C6-7 Nml Nml Nml Nml Nml 0 Nml Nml   Right Biceps Musculocut C5-6 Nml Nml Nml Nml Nml 0 Nml Nml   Right Deltoid Axillary C5-6 Nml Nml Nml Nml Nml 0 Nml Nml     Nerve Conduction Studies Anti Sensory Left/Right Comparison   Stim  Site L Lat (ms) R Lat (ms) L-R Lat (ms) L Amp (V) R Amp (V) L-R Amp (%) Site1 Site2 L Vel (m/s) R Vel (m/s) L-R Vel (m/s)  Median Acr Palm Anti Sensory (2nd Digit)  28.4C  Wrist *4.3 *4.6 0.3 29.8 36.1 17.5 Wrist Palm     Palm             Radial Anti Sensory (Base 1st Digit)  28.7C  Wrist 2.4 2.8 0.4 31.7 27.6 12.9 Wrist Base 1st Digit     Ulnar Anti Sensory (5th Digit)  28.8C  Wrist *4.3 *3.9 0.4 20.9 20.4 2.4 Wrist 5th Digit *33 *36 3   Motor Left/Right Comparison   Stim Site L Lat (ms) R Lat (ms) L-R Lat (ms) L Amp (mV) R Amp (mV) L-R Amp (%) Site1 Site2 L Vel (m/s) R Vel (m/s) L-R Vel (m/s)  Median Motor (Abd Poll Brev)  28.7C  Wrist 4.2 *4.5 0.3 8.7 5.3 39.1 Elbow Wrist 51 *48 3  Elbow 7.9 8.6 0.7 8.7 5.5 36.8       Ulnar Motor (Abd Dig Min)  28.8C  Wrist 4.0 3.7 0.3 9.5 10.6 10.4 B Elbow Wrist 61 55 6  B Elbow 7.1 6.8 0.3 9.6 11.7 17.9 A Elbow B Elbow 56 67 11  A Elbow 8.7 8.3 0.4 9.3 11.4 18.4          Waveforms:                      Clinical History: No specialty comments available.   She reports that she has never smoked. She has never used smokeless tobacco. No results for input(s): "HGBA1C", "LABURIC" in the last 8760 hours.  Objective:  VS:  HT:    WT:   BMI:     BP:   HR: bpm  TEMP: ( )  RESP:  Physical Exam Vitals and nursing note reviewed.  Constitutional:      General:  She is not in acute distress.    Appearance: Normal appearance. She is well-developed. She is not ill-appearing.  HENT:     Head: Normocephalic and atraumatic.  Eyes:     Conjunctiva/sclera: Conjunctivae normal.     Pupils: Pupils are equal, round, and reactive to light.  Cardiovascular:     Rate and Rhythm: Normal rate.     Pulses: Normal pulses.  Pulmonary:     Effort: Pulmonary effort is normal.  Musculoskeletal:        General: No swelling or deformity.     Cervical back: Neck supple. Tenderness present.     Right lower leg: No edema.     Left lower leg: No  edema.     Comments: Inspection reveals no atrophy of the bilateral APB or FDI or hand intrinsics. There is no swelling, color changes, allodynia or dystrophic changes. There is 5 out of 5 strength in the bilateral wrist extension, finger abduction and long finger flexion. There is intact sensation to light touch in all dermatomal and peripheral nerve distributions. There is a positive Tinel's test at the bilateral wrist and elbow. There is a eqivocal Phalen's test bilaterally. There is a negative Hoffmann's test bilaterally.  Skin:    General: Skin is warm and dry.     Findings: No erythema or rash.  Neurological:     General: No focal deficit present.     Mental Status: She is alert and oriented to person, place, and time.     Cranial Nerves: No cranial nerve deficit.     Sensory: No sensory deficit.     Motor: No weakness or abnormal muscle tone.     Coordination: Coordination normal.     Gait: Gait normal.  Psychiatric:        Mood and Affect: Mood normal.        Behavior: Behavior normal.     Ortho Exam  Imaging: No results found.  Past Medical/Family/Surgical/Social History: Medications & Allergies reviewed per EMR, new medications updated. 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   Osteoporosis 09/19/2011   Numbness and tingling 09/19/2011   Weakness generalized 09/19/2011   Retinitis pigmentosa 09/19/2011   Age-related osteoporosis without current pathological fracture 09/19/2011   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   Past Medical History:  Diagnosis Date   Acute pain of right knee 03/07/2017   Anemia    Anorexia    Anxiety    Bitemporal hemianopsia    Borderline personality disorder ([place])    Bulimia    Depression    Hyperlipidemia    Ocular migraine    Osteoporosis    Pigmentary retinal dystrophy    Retinitis pigmentosa    TBI (traumatic brain injury) ([place])    Family History  Problem Relation Age of Onset   Polymyalgia rheumatica Mother    Osteoporosis Mother    Cancer Father    Heart disease Father        CHF   Diabetes Sister        Obesity   Stomach cancer Maternal  Grandfather    Colon cancer Neg Hx    Esophageal cancer Neg Hx    Rectal cancer Neg Hx    Past Surgical History:  Procedure Laterality Date   CESAREAN SECTION     Social History   Occupational History   Occupation: Hotel manager education    Employer: [employer]   Occupation: Designer, fashion/clothing   Occupation: Contractor   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  Tobacco Use   Smoking status: Never   Smokeless tobacco: Never  Vaping Use   Vaping Use: Never used  Substance and Sexual Activity   Alcohol use: No   Drug use: No   Sexual activity: Not on file

## 2022-04-19 ENCOUNTER — Ambulatory Visit (INDEPENDENT_AMBULATORY_CARE_PROVIDER_SITE_OTHER): Payer: Medicare Other | Admitting: Psychology

## 2022-04-19 DIAGNOSIS — F411 Generalized anxiety disorder: Secondary | ICD-10-CM

## 2022-04-19 DIAGNOSIS — F509 Eating disorder, unspecified: Secondary | ICD-10-CM

## 2022-04-19 DIAGNOSIS — F603 Borderline personality disorder: Secondary | ICD-10-CM

## 2022-04-19 DIAGNOSIS — F4310 Post-traumatic stress disorder, unspecified: Secondary | ICD-10-CM | POA: Diagnosis not present

## 2022-04-20 ENCOUNTER — Encounter: Payer: Self-pay | Admitting: Orthopaedic Surgery

## 2022-04-20 ENCOUNTER — Ambulatory Visit (INDEPENDENT_AMBULATORY_CARE_PROVIDER_SITE_OTHER): Payer: Medicare Other | Admitting: Orthopaedic Surgery

## 2022-04-20 DIAGNOSIS — M545 Low back pain, unspecified: Secondary | ICD-10-CM

## 2022-04-20 DIAGNOSIS — M79602 Pain in left arm: Secondary | ICD-10-CM | POA: Diagnosis not present

## 2022-04-20 DIAGNOSIS — M79601 Pain in right arm: Secondary | ICD-10-CM | POA: Diagnosis not present

## 2022-04-20 DIAGNOSIS — R202 Paresthesia of skin: Secondary | ICD-10-CM

## 2022-04-20 MED ORDER — METHOCARBAMOL 500 MG PO TABS: 500.0000 mg | ORAL_TABLET | Freq: Four times a day (QID) | ORAL | 2 refills | Status: DC | PRN

## 2022-04-20 MED ORDER — PREDNISONE 10 MG (21) PO TBPK: ORAL_TABLET | ORAL | 3 refills | Status: DC
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
## 2022-04-20 NOTE — Progress Notes (Signed)
Office Visit Note   Patient: [name]           Date of Birth: [birth year]           MRN: [id number] Visit Date: 04/20/2022              Requested by: Harrison Mons, [place],  [place] [id number] PCP: Harrison Mons, PA   Assessment & Plan: Visit Diagnoses:  1. Bilateral arm pain   2. Paresthesia of skin   3. Acute left-sided low back pain without sciatica     Plan: Impression is bilateral hand and arm paresthesias with carpal tunnel syndrome electrodiagnostic studies.  Could also be a radiculopathy.  We agreed to try nighttime splints for now.  In regards to the back sounds like it is a lumbar strain.  Will prescribe prednisone and Robaxin.  She will try heat and modify activities.  Follow-up as needed.  Follow-Up Instructions: No follow-ups on file.   Orders:  No orders of the defined types were placed in this encounter.  Meds ordered this encounter  Medications   predniSONE (STERAPRED UNI-PAK 21 TAB) 10 MG (21) TBPK tablet    Sig: Take as directed    Dispense:  21 tablet    Refill:  3   methocarbamol (ROBAXIN) 500 MG tablet    Sig: Take 1 tablet (500 mg total) by mouth every 6 (six) hours as needed for muscle spasms.    Dispense:  30 tablet    Refill:  2      Procedures: No procedures performed   Clinical Data: No additional findings.   Subjective: Chief Complaint  Patient presents with   Left Arm - Follow-up    EMG review    HPI  [name] returns today to discuss recent EMGs.  Her main complaint is actually acute onset of low back pain.  She bent over the other day and felt something pull in her back.  Denies any radicular symptoms or red flag symptoms.  Review of Systems  Constitutional: Negative.   HENT: Negative.    Eyes: Negative.   Respiratory: Negative.    Cardiovascular: Negative.   Endocrine: Negative.   Musculoskeletal: Negative.   Neurological: Negative.   Hematological: Negative.    Psychiatric/Behavioral: Negative.    All other systems reviewed and are negative.    Objective: Vital Signs: LMP 02/18/2004   Physical Exam Vitals and nursing note reviewed.  Constitutional:      Appearance: She is well-developed.  HENT:     Head: Normocephalic and atraumatic.  Pulmonary:     Effort: Pulmonary effort is normal.  Abdominal:     Palpations: Abdomen is soft.  Musculoskeletal:     Cervical back: Neck supple.  Skin:    General: Skin is warm.     Capillary Refill: Capillary refill takes less than 2 seconds.  Neurological:     Mental Status: She is alert and oriented to person, place, and time.  Psychiatric:        Behavior: Behavior normal.        Thought Content: Thought content normal.        Judgment: Judgment normal.     Ortho Exam  Examination of the hands are unchanged.   Examination lumbar spine is nonfocal.  No sciatic tension signs.  Pain across the lumbar spine.  Specialty Comments:  No specialty comments available.  Imaging: No results found.   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   Osteoporosis 09/19/2011   Numbness and tingling 09/19/2011   Weakness generalized 09/19/2011   Retinitis pigmentosa 09/19/2011   Age-related osteoporosis without current pathological fracture 09/19/2011   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   Past Medical History:  Diagnosis Date   Acute pain of right knee 03/07/2017   Anemia    Anorexia    Anxiety     Bitemporal hemianopsia    Borderline personality disorder ([place])    Bulimia    Depression    Hyperlipidemia    Ocular migraine    Osteoporosis    Pigmentary retinal dystrophy    Retinitis pigmentosa    TBI (traumatic brain injury) ([place])     Family History  Problem Relation Age of Onset   Polymyalgia rheumatica Mother    Osteoporosis Mother    Cancer Father    Heart disease Father        CHF   Diabetes Sister        Obesity   Stomach cancer Maternal Grandfather    Colon cancer Neg Hx    Esophageal cancer Neg Hx    Rectal cancer Neg Hx     Past Surgical History:  Procedure Laterality Date   CESAREAN SECTION     Social History   Occupational History   Occupation: Hotel manager education    Employer: [employer]   Occupation: Designer, fashion/clothing   Occupation: Contractor   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  Tobacco Use   Smoking status: Never   Smokeless tobacco: Never  Vaping Use   Vaping Use: Never used  Substance and Sexual Activity   Alcohol use: No   Drug use: No   Sexual activity: Not on file

## 2022-04-26 ENCOUNTER — Ambulatory Visit (INDEPENDENT_AMBULATORY_CARE_PROVIDER_SITE_OTHER): Payer: Medicare Other | Admitting: Psychology

## 2022-04-26 DIAGNOSIS — F4310 Post-traumatic stress disorder, unspecified: Secondary | ICD-10-CM | POA: Diagnosis not present

## 2022-04-26 DIAGNOSIS — F509 Eating disorder, unspecified: Secondary | ICD-10-CM | POA: Diagnosis not present

## 2022-04-26 DIAGNOSIS — F603 Borderline personality disorder: Secondary | ICD-10-CM

## 2022-04-26 DIAGNOSIS — F411 Generalized anxiety disorder: Secondary | ICD-10-CM

## 2022-04-26 NOTE — Progress Notes (Signed)
[facility] Counselor/Therapist Progress Note  Patient ID: [name], MRN: [id number],    Date: 04/26/2022  Time Spent: 50 minutes  Treatment Type: Individual Therapy  Reported Symptoms: dissociation, panic attacks, anxiety  Mental Status Exam: Appearance:  Casual     Behavior: Attention-Seeking  Motor: Restlestness  Speech/Language:  Normal Rate  Affect: Blunt  Mood: pleasant  Thought process: concrete  Thought content:   Questionable delusions  Sensory/Perceptual disturbances:   WNL  Orientation: oriented to person, place, time/date, and situation  Attention: Poor  Concentration: Fair  Memory: [place] of knowledge:  Good  Insight:   Poor  Judgment:  Poor  Impulse Control: Fair   Risk Assessment: Danger to Self:  No Self-injurious Behavior: No Danger to Others: No Duty to Warn:no Physical Aggression / Violence:No  Access to Firearms a concern: No  Gang Involvement:No   Subjective: The patient attended a face-to-face individual therapy session in the office today.   The patient presents as somewhat anxious today.  She states that she is very concerned because she thinks that some things seriously wrong with her physically.  We talked about the referrals that she was supposed to get to a neuropsychologist and also to her GI person.  We problem solved a bit to try to come up with a way to make that happen.  I explained to her that she would likely need to advocate for herself.  She did send a message to her primary care doctor following up on getting the referrals.  We will continue to problem solve if she is not getting what she needs.  She did have the genetic testing for the medication and the nurse practitioner is going to go over that with her tomorrow.  I told the patient that my job was to help her advocate for herself so that she can get her needs met.  Interventions: Cognitive Behavioral Therapy, Dialectical Behavioral Therapy,  Solution-Oriented/Positive Psychology, Eye Movement Desensitization and Reprocessing (EMDR), and Insight-Oriented  Diagnosis:Borderline personality disorder in adult [facility])  GAD (generalized anxiety disorder)  PTSD (post-traumatic stress disorder)  Eating disorder with ongoing treatment  Plan: Treatment Plan Client Abilities/Strengths  Intelligent, kind, motivated  Client Treatment Preferences  Outpatient Trauma therapy  Client Statement of Needs  "I need some  help dealing with my anxiety and PTSD" Treatment Level  Outpatient Individual therapy  Symptoms  Describes a reliving of the event, particularly through dissociative flashbacks.:  (Status: maintained). Displays a significant decline in interest and engagement in activities.:  (Status: maintained). Displays significant psychological and/or physiological  distress resulting from internal and external clues that are reminiscent of the traumatic event.: (Status: maintained). Experiences disturbances in sleep.:   (Status: maintained). Experiences disturbing and persistent thoughts, images, and/or perceptions of the  traumatic event.: (Status: maintained). Experiences frequent nightmares.: No  Description Entered (Status: improved). Has been exposed to a traumatic event involving actual or  perceived threat of death or serious injury.:  (Status: maintained).  Hypervigilance (e.g., feeling constantly on edge, experiencing concentration difficulties, having trouble falling or staying asleep, exhibiting a general state of irritability).:  (Status:  maintained). Impairment in social, occupational, or other areas of functioning.:  (Status: maintained). Intentionally avoids activities, places, people, or objects (e.g., up-armored vehicles) that evoke memories of the event.:  (Status: maintained). Intentionally avoids  thoughts, feelings, or discussions related to the traumatic event.:  (Status:  maintained). Reports difficulty  concentrating as well as feelings of guilt.:   (Status: maintained). Reports response of  intense fear, helplessness, or horror to the traumatic event.:  (Status: maintained). Symptoms present more than one month.: (Status: maintained).   Problems Addressed  Anxiety, Posttraumatic Stress Disorder (PTSD), Posttraumatic Stress Disorder (PTSD), Anxiety,  Posttraumatic Stress Disorder (PTSD)  Goals 1. Enhance ability to effectively cope with the full variety of life's worries  and anxieties. Objective Identify and engage in pleasant activities on a daily basis. Target Date: 2022-12-24 Frequency: weekly Progress: 0 Modality: individual Objective Identify, challenge, and replace biased, fearful self-talk with positive, realistic, and empowering selftalk. Target Date: 2022-12-24 Frequency: weekly Progress: 0 Modality: individual Related Interventions 1. Assign the client a homework exercise in which he/she identifies fearful self-talk, identifies  biases in the self-talk, generates alternatives, and tests through behavioral experiments (or  assign "Negative Thoughts Trigger Negative Feelings" in the Adult Psychotherapy Homework  Planner by [employer]); review and reinforce success, providing corrective feedback toward  improvement. 2. Explore the client's schema and self-talk that mediate his/her fear response; assist him/her in  challenging the biases; replace the distorted messages with reality-based alternatives and  positive, realistic self-talk that will increase his/her self-confidence in coping with irrational  fears (see Cognitive Therapy of Anxiety Disorders by Alison Stalling).  2. No longer avoids persons, places, activities, and objects that are  reminiscent of the traumatic event. 3. No longer experiences intrusive event recollections, avoidance of event  reminders, intense arousal, or disinterest in activities or  relationships. 4. Stabilize anxiety level while increasing ability  to function on a daily  basis. 5. Thinks about or openly discusses the traumatic event with others  without experiencing psychological or physiological distress. Objective Participate in Cognitive Therapy to help identify, challenge, and replace biased, negative, and selfdefeating thoughts resulting from the trauma. Target Date: 2022-12-24 Frequency: weekly Progress: 0 Modality: individual Related Interventions 1. Using Cognitive Therapy techniques, explore the client's self-talk and beliefs about self, others,  and the future that are a consequence of the trauma (e.g., themes of safety, trust, power, control,  esteem, and intimacy); identify and challenge biases; assist him/her in generating appraisals that correct for the biases; test biased and alternatives predictions through behavioral experiments. Objective Participate in Eye Movement Desensitization and Reprocessing (EMDR) to reduce emotional distress  related to traumatic thoughts, feelings, and images. Target Date: 2022-12-24 Frequency: weekly Progress: 0 Modality: individual Related Interventions 1. Utilize Eye Movement Desensitization and Reprocessing (EMDR) to reduce the client's  emotional reactivity to the traumatic event and reduce PTSD symptoms.  Diagnosis  F43.10 (Posttraumatic stress disorder) - Open - [Signifier: n/a] Posttraumatic Stress  Disorder  Conditions For Discharge Achievement of treatment goals and objectives   Nyesha Cliff G Drevon Plog, LCSW

## 2022-05-03 ENCOUNTER — Ambulatory Visit (INDEPENDENT_AMBULATORY_CARE_PROVIDER_SITE_OTHER): Payer: Medicare Other | Admitting: Psychology

## 2022-05-03 DIAGNOSIS — F603 Borderline personality disorder: Secondary | ICD-10-CM | POA: Diagnosis not present

## 2022-05-03 DIAGNOSIS — F4310 Post-traumatic stress disorder, unspecified: Secondary | ICD-10-CM | POA: Diagnosis not present

## 2022-05-03 DIAGNOSIS — F4322 Adjustment disorder with anxiety: Secondary | ICD-10-CM | POA: Diagnosis not present

## 2022-05-03 DIAGNOSIS — F411 Generalized anxiety disorder: Secondary | ICD-10-CM | POA: Diagnosis not present

## 2022-05-03 NOTE — Progress Notes (Signed)
[place] Counselor/Therapist Progress Note  Patient ID: [name], MRN: [id number],    Date: 05/03/2022  Time Spent: 60 minutes  Treatment Type: Individual Therapy  Reported Symptoms: dissociation, panic attacks, anxiety  Mental Status Exam: Appearance:  Casual     Behavior: Attention-Seeking  Motor: Restlestness  Speech/Language:  Normal Rate  Affect: Blunt  Mood: pleasant  Thought process: concrete  Thought content:   Questionable delusions  Sensory/Perceptual disturbances:   WNL  Orientation: oriented to person, place, time/date, and situation  Attention: Poor  Concentration: Fair  Memory: [place] of knowledge:  Good  Insight:   Poor  Judgment:  Poor  Impulse Control: Fair   Risk Assessment: Danger to Self:  No Self-injurious Behavior: No Danger to Others: No Duty to Warn:no Physical Aggression / Violence:No  Access to Firearms a concern: No  Gang Involvement:No   Subjective: The patient attended a face-to-face individual therapy session in the office today.   The patient continues to present as a little anxious today.  The patient reports that she is going out of town next week to [place].  She talked about being worried that something is really wrong physically with her body.  She did get the genetic testing and it seems that some of the medicines that she is on are not necessarily the best ones for her and they are going to work towards changing those.  The patient is interested in a neuropsych consult but cannot get in until December with the one that her primary care was referring her to.  I recommended that she possibly consider Dr. Eliezer Lofts [place].  In addition we talked about her waking up anxious and as I got more into a discussion with her about this it seems that she is not eating any food after 12:00 PM.  She says that she eats about 1000 cal a day and she is eating a lot of 2 that she will pops.  I told her that I thought maybe she  needed to get some labs done to check her sugar and her A1c as it seems that the majority of the food that she eats are carbs.  I am concerned that she may be having some blood sugar drops at night if she is not eating properly.  I encouraged her to check with her primary care physician and see if she could get blood work done and also to try to eat a more balanced diet. Interventions: Cognitive Behavioral Therapy, Dialectical Behavioral Therapy, Solution-Oriented/Positive Psychology, Eye Movement Desensitization and Reprocessing (EMDR), and Insight-Oriented  Diagnosis:Borderline personality disorder in adult [facility])  GAD (generalized anxiety disorder)  PTSD (post-traumatic stress disorder)  Adjustment disorder with anxiety  Plan: Treatment Plan Client Abilities/Strengths  Intelligent, kind, motivated  Client Treatment Preferences  Outpatient Trauma therapy  Client Statement of Needs  "I need some  help dealing with my anxiety and PTSD" Treatment Level  Outpatient Individual therapy  Symptoms  Describes a reliving of the event, particularly through dissociative flashbacks.:  (Status: maintained). Displays a significant decline in interest and engagement in activities.:  (Status: maintained). Displays significant psychological and/or physiological  distress resulting from internal and external clues that are reminiscent of the traumatic event.: (Status: maintained). Experiences disturbances in sleep.:   (Status: maintained). Experiences disturbing and persistent thoughts, images, and/or perceptions of the  traumatic event.: (Status: maintained). Experiences frequent nightmares.: No  Description Entered (Status: improved). Has been exposed to a traumatic event involving actual or  perceived  threat of death or serious injury.:  (Status: maintained).  Hypervigilance (e.g., feeling constantly on edge, experiencing concentration difficulties, having trouble falling or staying asleep, exhibiting  a general state of irritability).:  (Status:  maintained). Impairment in social, occupational, or other areas of functioning.:  (Status: maintained). Intentionally avoids activities, places, people, or objects (e.g., up-armored vehicles) that evoke memories of the event.:  (Status: maintained). Intentionally avoids  thoughts, feelings, or discussions related to the traumatic event.:  (Status:  maintained). Reports difficulty concentrating as well as feelings of guilt.:   (Status: maintained). Reports response of intense fear, helplessness, or horror to the traumatic event.:  (Status: maintained). Symptoms present more than one month.: (Status: maintained).   Problems Addressed  Anxiety, Posttraumatic Stress Disorder (PTSD), Posttraumatic Stress Disorder (PTSD), Anxiety,  Posttraumatic Stress Disorder (PTSD)  Goals 1. Enhance ability to effectively cope with the full variety of life's worries  and anxieties. Objective Identify and engage in pleasant activities on a daily basis. Target Date: 2022-12-24 Frequency: weekly Progress: 0 Modality: individual Objective Identify, challenge, and replace biased, fearful self-talk with positive, realistic, and empowering selftalk. Target Date: 2022-12-24 Frequency: weekly Progress: 0 Modality: individual Related Interventions 1. Assign the client a homework exercise in which he/she identifies fearful self-talk, identifies  biases in the self-talk, generates alternatives, and tests through behavioral experiments (or  assign "Negative Thoughts Trigger Negative Feelings" in the Adult Psychotherapy Homework  Planner by [facility]); review and reinforce success, providing corrective feedback toward  improvement. 2. Explore the client's schema and self-talk that mediate his/her fear response; assist him/her in  challenging the biases; replace the distorted messages with reality-based alternatives and  positive, realistic self-talk that will increase his/her  self-confidence in coping with irrational  fears (see Cognitive Therapy of Anxiety Disorders by Alison Stalling).  2. No longer avoids persons, places, activities, and objects that are  reminiscent of the traumatic event. 3. No longer experiences intrusive event recollections, avoidance of event  reminders, intense arousal, or disinterest in activities or  relationships. 4. Stabilize anxiety level while increasing ability to function on a daily  basis. 5. Thinks about or openly discusses the traumatic event with others  without experiencing psychological or physiological distress. Objective Participate in Cognitive Therapy to help identify, challenge, and replace biased, negative, and selfdefeating thoughts resulting from the trauma. Target Date: 2022-12-24 Frequency: weekly Progress: 0 Modality: individual Related Interventions 1. Using Cognitive Therapy techniques, explore the client's self-talk and beliefs about self, others,  and the future that are a consequence of the trauma (e.g., themes of safety, trust, power, control,  esteem, and intimacy); identify and challenge biases; assist him/her in generating appraisals that correct for the biases; test biased and alternatives predictions through behavioral experiments. Objective Participate in Eye Movement Desensitization and Reprocessing (EMDR) to reduce emotional distress  related to traumatic thoughts, feelings, and images. Target Date: 2022-12-24 Frequency: weekly Progress: 0 Modality: individual Related Interventions 1. Utilize Eye Movement Desensitization and Reprocessing (EMDR) to reduce the client's  emotional reactivity to the traumatic event and reduce PTSD symptoms.  Diagnosis  F43.10 (Posttraumatic stress disorder) - Open - [Signifier: n/a] Posttraumatic Stress  Disorder  Conditions For Discharge Achievement of treatment goals and objectives   Ashling Roane G Finneus Kaneshiro, LCSW

## 2022-05-09 NOTE — Telephone Encounter (Signed)
Unfortunately she is unable to tolerate EGD and we have doneeverything else that we can to help.  Please schedule office visit with me next available appointment.  Thank you

## 2022-05-10 ENCOUNTER — Ambulatory Visit: Payer: Medicare Other | Admitting: Psychology

## 2022-05-17 ENCOUNTER — Ambulatory Visit (INDEPENDENT_AMBULATORY_CARE_PROVIDER_SITE_OTHER): Payer: Medicare Other | Admitting: Psychology

## 2022-05-17 DIAGNOSIS — F4310 Post-traumatic stress disorder, unspecified: Secondary | ICD-10-CM

## 2022-05-17 DIAGNOSIS — F509 Eating disorder, unspecified: Secondary | ICD-10-CM | POA: Diagnosis not present

## 2022-05-17 DIAGNOSIS — F411 Generalized anxiety disorder: Secondary | ICD-10-CM

## 2022-05-17 DIAGNOSIS — F603 Borderline personality disorder: Secondary | ICD-10-CM

## 2022-05-18 NOTE — Progress Notes (Signed)
[facility] Counselor/Therapist Progress Note  Patient ID: [name], MRN: [id number],    Date: 05/17/2022  Time Spent: 60 minutes  Treatment Type: Individual Therapy  Reported Symptoms: dissociation, panic attacks, anxiety  Mental Status Exam: Appearance:  Casual     Behavior: Attention-Seeking  Motor: Restlestness  Speech/Language:  Normal Rate  Affect: Blunt  Mood: pleasant  Thought process: concrete  Thought content:   Questionable delusions  Sensory/Perceptual disturbances:   WNL  Orientation: oriented to person, place, time/date, and situation  Attention: Poor  Concentration: Fair  Memory: [place] of knowledge:  Good  Insight:   Poor  Judgment:  Poor  Impulse Control: Fair   Risk Assessment: Danger to Self:  No Self-injurious Behavior: No Danger to Others: No Duty to Warn:no Physical Aggression / Violence:No  Access to Firearms a concern: No  Gang Involvement:No   Subjective: The patient attended a face-to-face individual therapy session in the office today.   The patient continues to present as a little anxious today.  The patient reports that she could not go to [place] last week because she felt too anxious.  The patient also states that she is going to have a flat fasting blood work done tomorrow and hopefully this will give [place] more information about whether her anxiety is related to something physical or if it is just anxiety.  The patient talked about still having episodes of dissociation but she states that is better than it was.  We talked about how her mother probably had borderline personality disorder and her daughter also has borderline personality disorder order.  We talked about some of her anxiety possibly being related to the fear of abandonment.  The patient does have some understanding that she is having some of these issues herself.  We will continue to explore the root of her issues and try to help her feel more secure  within herself using EMDR at some point in time in the future. Interventions: Cognitive Behavioral Therapy, Dialectical Behavioral Therapy, Solution-Oriented/Positive Psychology, Eye Movement Desensitization and Reprocessing (EMDR), and Insight-Oriented  Diagnosis:Borderline personality disorder in adult  GAD (generalized anxiety disorder)  PTSD (post-traumatic stress disorder)  Eating disorder, unspecified type  Plan: Treatment Plan Client Abilities/Strengths  Intelligent, kind, motivated  Client Treatment Preferences  Outpatient Trauma therapy  Client Statement of Needs  "I need some  help dealing with my anxiety and PTSD" Treatment Level  Outpatient Individual therapy  Symptoms  Describes a reliving of the event, particularly through dissociative flashbacks.:  (Status: maintained). Displays a significant decline in interest and engagement in activities.:  (Status: maintained). Displays significant psychological and/or physiological  distress resulting from internal and external clues that are reminiscent of the traumatic event.: (Status: maintained). Experiences disturbances in sleep.:   (Status: maintained). Experiences disturbing and persistent thoughts, images, and/or perceptions of the  traumatic event.: (Status: maintained). Experiences frequent nightmares.: No  Description Entered (Status: improved). Has been exposed to a traumatic event involving actual or  perceived threat of death or serious injury.:  (Status: maintained).  Hypervigilance (e.g., feeling constantly on edge, experiencing concentration difficulties, having trouble falling or staying asleep, exhibiting a general state of irritability).:  (Status:  maintained). Impairment in social, occupational, or other areas of functioning.:  (Status: maintained). Intentionally avoids activities, places, people, or objects (e.g., up-armored vehicles) that evoke memories of the event.:  (Status: maintained). Intentionally  avoids  thoughts, feelings, or discussions related to the traumatic event.:  (Status:  maintained). Reports  difficulty concentrating as well as feelings of guilt.:   (Status: maintained). Reports response of intense fear, helplessness, or horror to the traumatic event.:  (Status: maintained). Symptoms present more than one month.: (Status: maintained).   Problems Addressed  Anxiety, Posttraumatic Stress Disorder (PTSD), Posttraumatic Stress Disorder (PTSD), Anxiety,  Posttraumatic Stress Disorder (PTSD)  Goals 1. Enhance ability to effectively cope with the full variety of life's worries  and anxieties. Objective Identify and engage in pleasant activities on a daily basis. Target Date: 2022-12-24 Frequency: weekly Progress: 0 Modality: individual Objective Identify, challenge, and replace biased, fearful self-talk with positive, realistic, and empowering selftalk. Target Date: 2022-12-24 Frequency: weekly Progress: 0 Modality: individual Related Interventions 1. Assign the client a homework exercise in which he/she identifies fearful self-talk, identifies  biases in the self-talk, generates alternatives, and tests through behavioral experiments (or  assign "Negative Thoughts Trigger Negative Feelings" in the Adult Psychotherapy Homework  Planner by [facility]); review and reinforce success, providing corrective feedback toward  improvement. 2. Explore the client's schema and self-talk that mediate his/her fear response; assist him/her in  challenging the biases; replace the distorted messages with reality-based alternatives and  positive, realistic self-talk that will increase his/her self-confidence in coping with irrational  fears (see Cognitive Therapy of Anxiety Disorders by Alison Stalling).  2. No longer avoids persons, places, activities, and objects that are  reminiscent of the traumatic event. 3. No longer experiences intrusive event recollections, avoidance of event  reminders,  intense arousal, or disinterest in activities or  relationships. 4. Stabilize anxiety level while increasing ability to function on a daily  basis. 5. Thinks about or openly discusses the traumatic event with others  without experiencing psychological or physiological distress. Objective Participate in Cognitive Therapy to help identify, challenge, and replace biased, negative, and selfdefeating thoughts resulting from the trauma. Target Date: 2022-12-24 Frequency: weekly Progress: 0 Modality: individual Related Interventions 1. Using Cognitive Therapy techniques, explore the client's self-talk and beliefs about self, others,  and the future that are a consequence of the trauma (e.g., themes of safety, trust, power, control,  esteem, and intimacy); identify and challenge biases; assist him/her in generating appraisals that correct for the biases; test biased and alternatives predictions through behavioral experiments. Objective Participate in Eye Movement Desensitization and Reprocessing (EMDR) to reduce emotional distress  related to traumatic thoughts, feelings, and images. Target Date: 2022-12-24 Frequency: weekly Progress: 0 Modality: individual Related Interventions 1. Utilize Eye Movement Desensitization and Reprocessing (EMDR) to reduce the client's  emotional reactivity to the traumatic event and reduce PTSD symptoms.  Diagnosis  F43.10 (Posttraumatic stress disorder) - Open - [Signifier: n/a] Posttraumatic Stress  Disorder  Conditions For Discharge Achievement of treatment goals and objectives   Dwana Garin G Shayne Diguglielmo, LCSW

## 2022-05-24 ENCOUNTER — Ambulatory Visit (INDEPENDENT_AMBULATORY_CARE_PROVIDER_SITE_OTHER): Payer: Medicare Other | Admitting: Psychology

## 2022-05-24 DIAGNOSIS — F411 Generalized anxiety disorder: Secondary | ICD-10-CM | POA: Diagnosis not present

## 2022-05-24 DIAGNOSIS — F603 Borderline personality disorder: Secondary | ICD-10-CM | POA: Diagnosis not present

## 2022-05-24 DIAGNOSIS — F509 Eating disorder, unspecified: Secondary | ICD-10-CM | POA: Diagnosis not present

## 2022-05-24 NOTE — Progress Notes (Signed)
[facility] Counselor/Therapist Progress Note  Patient ID: [name], MRN: [id number],    Date: 05/24/2022  Time Spent: 60 minutes  Treatment Type: Individual Therapy  Reported Symptoms: dissociation, panic attacks, anxiety  Mental Status Exam: Appearance:  Casual     Behavior: Attention-Seeking  Motor: Restlestness  Speech/Language:  Normal Rate  Affect: Blunt  Mood: pleasant  Thought process: concrete  Thought content:   Questionable delusions  Sensory/Perceptual disturbances:   WNL  Orientation: oriented to person, place, time/date, and situation  Attention: Poor  Concentration: Fair  Memory: WNL  Fund of knowledge:  Good  Insight:   Poor  Judgment:  Poor  Impulse Control: Fair   Risk Assessment: Danger to Self:  No Self-injurious Behavior: No Danger to Others: No Duty to Warn:no Physical Aggression / Violence:No  Access to Firearms a concern: No  Gang Involvement:No   Subjective: The patient attended a face-to-face individual therapy session in the office today.    The patient presents as pleasant and cooperative.  She does appear a little anxious today.  The patient reports that she got her results back and she is not having any problems with her blood sugars.  The patient continues to be real focused on dissociating and her anxiety.  She is supposed to have an appointment with a GI doctor next week.  She states that she knows something is wrong with her.  We talked a little more today about her relationship with her parents and it seems that this anxiety and dissociation probably is related to the lack of security that she feels within herself because she has no support.  She asked me today if we were going to do EMDR and I explained to her that I was not comfortable doing that until I knew exactly why she was having the problem with dissociation as it could make her worse.  We will continue to work with patient to see if we can come to some  understanding of what is going on with her so that we can move forward with treatment options. Interventions: Cognitive Behavioral Therapy, Dialectical Behavioral Therapy, Solution-Oriented/Positive Psychology, Eye Movement Desensitization and Reprocessing (EMDR), and Insight-Oriented  Diagnosis:Borderline personality disorder in adult  GAD (generalized anxiety disorder)  Eating disorder, unspecified type  Plan: Treatment Plan Client Abilities/Strengths  Intelligent, kind, motivated  Client Treatment Preferences  Outpatient Trauma therapy  Client Statement of Needs  "I need some  help dealing with my anxiety and PTSD" Treatment Level  Outpatient Individual therapy  Symptoms  Describes a reliving of the event, particularly through dissociative flashbacks.:  (Status: maintained). Displays a significant decline in interest and engagement in activities.:  (Status: maintained). Displays significant psychological and/or physiological  distress resulting from internal and external clues that are reminiscent of the traumatic event.: (Status: maintained). Experiences disturbances in sleep.:   (Status: maintained). Experiences disturbing and persistent thoughts, images, and/or perceptions of the  traumatic event.: (Status: maintained). Experiences frequent nightmares.: No  Description Entered (Status: improved). Has been exposed to a traumatic event involving actual or  perceived threat of death or serious injury.:  (Status: maintained).  Hypervigilance (e.g., feeling constantly on edge, experiencing concentration difficulties, having trouble falling or staying asleep, exhibiting a general state of irritability).:  (Status:  maintained). Impairment in social, occupational, or other areas of functioning.:  (Status: maintained). Intentionally avoids activities, places, people, or objects (e.g., up-armored vehicles) that evoke memories of the event.:  (Status: maintained). Intentionally avoids   thoughts, feelings,  or discussions related to the traumatic event.:  (Status:  maintained). Reports difficulty concentrating as well as feelings of guilt.:   (Status: maintained). Reports response of intense fear, helplessness, or horror to the traumatic event.:  (Status: maintained). Symptoms present more than one month.: (Status: maintained).   Problems Addressed  Anxiety, Posttraumatic Stress Disorder (PTSD), Posttraumatic Stress Disorder (PTSD), Anxiety,  Posttraumatic Stress Disorder (PTSD)  Goals 1. Enhance ability to effectively cope with the full variety of life's worries  and anxieties. Objective Identify and engage in pleasant activities on a daily basis. Target Date: 2022-12-24 Frequency: weekly Progress: 0 Modality: individual Objective Identify, challenge, and replace biased, fearful self-talk with positive, realistic, and empowering selftalk. Target Date: 2022-12-24 Frequency: weekly Progress: 0 Modality: individual Related Interventions 1. Assign the client a homework exercise in which he/she identifies fearful self-talk, identifies  biases in the self-talk, generates alternatives, and tests through behavioral experiments (or  assign "Negative Thoughts Trigger Negative Feelings" in the Adult Psychotherapy Homework  Planner by [facility]); review and reinforce success, providing corrective feedback toward  improvement. 2. Explore the client's schema and self-talk that mediate his/her fear response; assist him/her in  challenging the biases; replace the distorted messages with reality-based alternatives and  positive, realistic self-talk that will increase his/her self-confidence in coping with irrational  fears (see Cognitive Therapy of Anxiety Disorders by Laurence Slate).  2. No longer avoids persons, places, activities, and objects that are  reminiscent of the traumatic event. 3. No longer experiences intrusive event recollections, avoidance of event  reminders,  intense arousal, or disinterest in activities or  relationships. 4. Stabilize anxiety level while increasing ability to function on a daily  basis. 5. Thinks about or openly discusses the traumatic event with others  without experiencing psychological or physiological distress. Objective Participate in Cognitive Therapy to help identify, challenge, and replace biased, negative, and selfdefeating thoughts resulting from the trauma. Target Date: 2022-12-24 Frequency: weekly Progress: 0 Modality: individual Related Interventions 1. Using Cognitive Therapy techniques, explore the client's self-talk and beliefs about self, others,  and the future that are a consequence of the trauma (e.g., themes of safety, trust, power, control,  esteem, and intimacy); identify and challenge biases; assist him/her in generating appraisals that correct for the biases; test biased and alternatives predictions through behavioral experiments. Objective Participate in Eye Movement Desensitization and Reprocessing (EMDR) to reduce emotional distress  related to traumatic thoughts, feelings, and images. Target Date: 2022-12-24 Frequency: weekly Progress: 0 Modality: individual Related Interventions 1. Utilize Eye Movement Desensitization and Reprocessing (EMDR) to reduce the client's  emotional reactivity to the traumatic event and reduce PTSD symptoms.  Diagnosis  F43.10 (Posttraumatic stress disorder) - Open - [Signifier: n/a] Posttraumatic Stress  Disorder  Conditions For Discharge Achievement of treatment goals and objectives   Neiko Trivedi G Javious Hallisey, LCSW                        Janee Ureste G Decie Verne, LCSW

## 2022-05-31 ENCOUNTER — Ambulatory Visit (INDEPENDENT_AMBULATORY_CARE_PROVIDER_SITE_OTHER): Payer: Medicare Other | Admitting: Psychology

## 2022-05-31 DIAGNOSIS — F4322 Adjustment disorder with anxiety: Secondary | ICD-10-CM | POA: Diagnosis not present

## 2022-05-31 DIAGNOSIS — F411 Generalized anxiety disorder: Secondary | ICD-10-CM | POA: Diagnosis not present

## 2022-05-31 DIAGNOSIS — F509 Eating disorder, unspecified: Secondary | ICD-10-CM

## 2022-05-31 DIAGNOSIS — F603 Borderline personality disorder: Secondary | ICD-10-CM | POA: Diagnosis not present

## 2022-05-31 DIAGNOSIS — F4310 Post-traumatic stress disorder, unspecified: Secondary | ICD-10-CM

## 2022-06-01 NOTE — Progress Notes (Signed)
[facility] Counselor/Therapist Progress Note  Patient ID: Tristen Pennino, MRN: [id number],    Date: 05/31/2022  Time Spent: 60 minutes  Treatment Type: Individual Therapy  Reported Symptoms: dissociation, panic attacks, anxiety  Mental Status Exam: Appearance:  Casual     Behavior: Attention-Seeking  Motor: Restlestness  Speech/Language:  Normal Rate  Affect: Blunt  Mood: anxious  Thought process: concrete  Thought content:   Questionable delusions  Sensory/Perceptual disturbances:   WNL  Orientation: oriented to person, place, time/date, and situation  Attention: Poor  Concentration: Fair  Memory: WNL  Fund of knowledge:  Good  Insight:   Poor  Judgment:  Poor  Impulse Control: Fair   Risk Assessment: Danger to Self:  No Self-injurious Behavior: No Danger to Others: No Duty to Warn:no Physical Aggression / Violence:No  Access to Firearms a concern: No  Gang Involvement:No   Subjective: The patient attended a face-to-face individual therapy session in the office today.    The patient presents with an anxious mood and affect.  The patient states that she is supposed to go tomorrow to see her new GI doctor.  The patient states that she is so anxious and still dissociating.  We talked about cognitively what she needs to do to help herself not do this as much.  I still believe that it is related to her not having a sense of self and her passing of her parents.  The patient did become tearful during the session talking about losing her mother and father.  We talked about her needing to ground herself and to know that she is handling things.  I am also concerned because she goes and gets medication prescriptions and then she chooses not to take them because of fear of how it might interact.  I encouraged her to consider taking the medication for nightmares with a friend of hers there.  The patient continues to kind of spiral and to focusing on being anxious and  dissociating.  I encouraged her to try to tell herself different messages and explained to her about how cognitive therapy works.  Interventions: Cognitive Behavioral Therapy, Dialectical Behavioral Therapy, Solution-Oriented/Positive Psychology, Eye Movement Desensitization and Reprocessing (EMDR), and Insight-Oriented  Diagnosis:Borderline personality disorder in adult  GAD (generalized anxiety disorder)  Eating disorder, unspecified type  Adjustment disorder with anxiety  PTSD (post-traumatic stress disorder)  Plan: Treatment Plan Client Abilities/Strengths  Intelligent, kind, motivated  Client Treatment Preferences  Outpatient Trauma therapy  Client Statement of Needs  "I need some  help dealing with my anxiety and PTSD" Treatment Level  Outpatient Individual therapy  Symptoms  Describes a reliving of the event, particularly through dissociative flashbacks.:  (Status: maintained). Displays a significant decline in interest and engagement in activities.:  (Status: maintained). Displays significant psychological and/or physiological  distress resulting from internal and external clues that are reminiscent of the traumatic event.: (Status: maintained). Experiences disturbances in sleep.:   (Status: maintained). Experiences disturbing and persistent thoughts, images, and/or perceptions of the  traumatic event.: (Status: maintained). Experiences frequent nightmares.: No   Hypervigilance (e.g., feeling constantly on edge, experiencing concentration difficulties, having trouble falling or staying asleep, exhibiting a general state of irritability).:  (Status:  maintained). Impairment in social, occupational, or other areas of functioning.:  (Status: maintained). Intentionally avoids activities, places, people, or objects (e.g., up-armored vehicles) that evoke memories of the event.:  (Status: maintained). Intentionally avoids  thoughts, feelings, or discussions related to the  traumatic event.:  (Status:  maintained). Reports difficulty  concentrating as well as feelings of guilt.:   (Status: maintained). Reports response of intense fear, helplessness, or horror to the traumatic event.:  (Status: maintained). Symptoms present more than one month.: (Status: maintained).   Problems Addressed  Anxiety, PTSD  Goals 1. Enhance ability to effectively cope with the full variety of life's worries  and anxieties. Objective Identify and engage in pleasant activities on a daily basis. Target Date: 2022-12-24 Frequency: weekly Progress: 0 Modality: individual Objective Identify, challenge, and replace biased, fearful self-talk with positive, realistic, and empowering selftalk. Target Date: 2022-12-24 Frequency: weekly Progress: 0 Modality: individual Related Interventions 1. Assign the client a homework exercise in which he/she identifies fearful self-talk, identifies  biases in the self-talk, generates alternatives, and tests through behavioral experiments (or  assign "Negative Thoughts Trigger Negative Feelings" in the Adult Psychotherapy Homework  Planner by [facility]); review and reinforce success, providing corrective feedback toward  improvement. 2. Explore the client's schema and self-talk that mediate his/her fear response; assist him/her in  challenging the biases; replace the distorted messages with reality-based alternatives and  positive, realistic self-talk that will increase his/her self-confidence in coping with irrational  fears (see Cognitive Therapy of Anxiety Disorders by Laurence Slate).  2. No longer avoids persons, places, activities, and objects that are  reminiscent of the traumatic event. 3. No longer experiences intrusive event recollections, avoidance of event  reminders, intense arousal, or disinterest in activities or  relationships. 4. Stabilize anxiety level while increasing ability to function on a daily  basis. 5. Thinks about or  openly discusses the traumatic event with others  without experiencing psychological or physiological distress. Objective Participate in Cognitive Therapy to help identify, challenge, and replace biased, negative, and selfdefeating thoughts resulting from the trauma. Target Date: 2022-12-24 Frequency: weekly Progress: 0 Modality: individual Related Interventions 1. Using Cognitive Therapy techniques, explore the client's self-talk and beliefs about self, others,  and the future that are a consequence of the trauma (e.g., themes of safety, trust, power, control,  esteem, and intimacy); identify and challenge biases; assist him/her in generating appraisals that correct for the biases; test biased and alternatives predictions through behavioral experiments. Objective Participate in Eye Movement Desensitization and Reprocessing (EMDR) to reduce emotional distress  related to traumatic thoughts, feelings, and images. Target Date: 2022-12-24 Frequency: weekly Progress: 0 Modality: individual Related Interventions 1. Utilize Eye Movement Desensitization and Reprocessing (EMDR) to reduce the client's  emotional reactivity to the traumatic event and reduce PTSD symptoms.  Diagnosis  F43.10 (Posttraumatic stress disorder) - Open - [Signifier: n/a] Posttraumatic Stress  Disorder  Conditions For Discharge Achievement of treatment goals and objectives   Odyn Turko G Brenin Heidelberger, LCSW

## 2022-06-07 ENCOUNTER — Ambulatory Visit: Payer: Medicare Other | Admitting: Psychology

## 2022-06-14 ENCOUNTER — Ambulatory Visit (INDEPENDENT_AMBULATORY_CARE_PROVIDER_SITE_OTHER): Payer: Medicare Other | Admitting: Psychology

## 2022-06-14 DIAGNOSIS — F509 Eating disorder, unspecified: Secondary | ICD-10-CM

## 2022-06-14 DIAGNOSIS — F411 Generalized anxiety disorder: Secondary | ICD-10-CM

## 2022-06-14 DIAGNOSIS — F603 Borderline personality disorder: Secondary | ICD-10-CM | POA: Diagnosis not present

## 2022-06-14 DIAGNOSIS — F4310 Post-traumatic stress disorder, unspecified: Secondary | ICD-10-CM | POA: Diagnosis not present

## 2022-06-14 NOTE — Progress Notes (Signed)
[facility] Counselor/Therapist Progress Note  Patient ID: [name], MRN: [id number],    Date: 06/14/2022  Time Spent: 50 minutes  Treatment Type: Individual Therapy  Reported Symptoms: dissociation, panic attacks, anxiety  Mental Status Exam: Appearance:  Casual     Behavior: Attention-Seeking  Motor: Restlestness  Speech/Language:  Normal Rate  Affect: Blunt  Mood: anxious  Thought process: concrete  Thought content:   Questionable delusions  Sensory/Perceptual disturbances:   WNL  Orientation: oriented to person, place, time/date, and situation  Attention: Poor  Concentration: Fair  Memory: WNL  Fund of knowledge:  Good  Insight:   Poor  Judgment:  Poor  Impulse Control: Fair   Risk Assessment: Danger to Self:  No Self-injurious Behavior: No Danger to Others: No Duty to Warn:no Physical Aggression / Violence:No  Access to Firearms a concern: No  Gang Involvement:No   Subjective: The patient attended a face-to-face individual therapy session on caregility today.  The patient gave verbal consent for the session to be on caregility.  The patient was in her home alone and the therapist  was in the office.  The patient reports that she overslept and could not get here today and person.  The patient states that she has been struggling with her sleep and she was talking about taking the drug for her nightmares and it making her more groggy.  We talked briefly about her sleep hygiene and we discussed the need for her to work a little harder at trying to have better sleep hygiene.  The patient is staying up because of fear that she is going to wake up anxious.  In addition she talked about going to [facility] and speaking to her GI person.  They seem to think she is constipated and want to do a test and are referring her to [facility] to have the test done.  She reports that tomorrow she has an appointment with a neuropsychologist to have some testing done for  about 2 hours and she will let me know how that goes.  We talked about some of her issues being that she avoids dealing with things because of her symptoms.  She made a comment about wanting to move again and I pressed her on this a little bit and explained to her that that was not going to solve the problem that she is likely going to have to deal with things and that the thing that she needs to deal with most is the loss of her mother. Interventions: Cognitive Behavioral Therapy, Dialectical Behavioral Therapy, Solution-Oriented/Positive Psychology, Eye Movement Desensitization and Reprocessing (EMDR), and Insight-Oriented  Diagnosis:Borderline personality disorder in adult [facility])  GAD (generalized anxiety disorder)  Eating disorder, unspecified type  PTSD (post-traumatic stress disorder)  Plan: Treatment Plan Client Abilities/Strengths  Intelligent, kind, motivated  Client Treatment Preferences  Outpatient Trauma therapy  Client Statement of Needs  "I need some  help dealing with my anxiety and PTSD" Treatment Level  Outpatient Individual therapy  Symptoms  Describes a reliving of the event, particularly through dissociative flashbacks.:  (Status: maintained). Displays a significant decline in interest and engagement in activities.:  (Status: maintained). Displays significant psychological and/or physiological  distress resulting from internal and external clues that are reminiscent of the traumatic event.: (Status: maintained). Experiences disturbances in sleep.:   (Status: maintained). Experiences disturbing and persistent thoughts, images, and/or perceptions of the  traumatic event.: (Status: maintained). Experiences frequent nightmares.: No   Hypervigilance (e.g., feeling constantly on edge, experiencing concentration difficulties,  having trouble falling or staying asleep, exhibiting a general state of irritability).:  (Status:  maintained). Impairment in social, occupational, or  other areas of functioning.:  (Status: maintained). Intentionally avoids activities, places, people, or objects (e.g., up-armored vehicles) that evoke memories of the event.:  (Status: maintained). Intentionally avoids  thoughts, feelings, or discussions related to the traumatic event.:  (Status:  maintained). Reports difficulty concentrating as well as feelings of guilt.:   (Status: maintained). Reports response of intense fear, helplessness, or horror to the traumatic event.:  (Status: maintained). Symptoms present more than one month.: (Status: maintained).   Problems Addressed  Anxiety, PTSD  Goals 1. Enhance ability to effectively cope with the full variety of life's worries  and anxieties. Objective Identify and engage in pleasant activities on a daily basis. Target Date: 2022-12-24 Frequency: weekly Progress: 0 Modality: individual Objective Identify, challenge, and replace biased, fearful self-talk with positive, realistic, and empowering selftalk. Target Date: 2022-12-24 Frequency: weekly Progress: 0 Modality: individual Related Interventions 1. Assign the client a homework exercise in which he/she identifies fearful self-talk, identifies  biases in the self-talk, generates alternatives, and tests through behavioral experiments (or  assign "Negative Thoughts Trigger Negative Feelings" in the Adult Psychotherapy Homework  Planner by [facility]); review and reinforce success, providing corrective feedback toward  improvement. 2. Explore the client's schema and self-talk that mediate his/her fear response; assist him/her in  challenging the biases; replace the distorted messages with reality-based alternatives and  positive, realistic self-talk that will increase his/her self-confidence in coping with irrational  fears (see Cognitive Therapy of Anxiety Disorders by Laurence Slate).  2. No longer avoids persons, places, activities, and objects that are  reminiscent of the traumatic  event. 3. No longer experiences intrusive event recollections, avoidance of event  reminders, intense arousal, or disinterest in activities or  relationships. 4. Stabilize anxiety level while increasing ability to function on a daily  basis. 5. Thinks about or openly discusses the traumatic event with others  without experiencing psychological or physiological distress. Objective Participate in Cognitive Therapy to help identify, challenge, and replace biased, negative, and selfdefeating thoughts resulting from the trauma. Target Date: 2022-12-24 Frequency: weekly Progress: 0 Modality: individual Related Interventions 1. Using Cognitive Therapy techniques, explore the client's self-talk and beliefs about self, others,  and the future that are a consequence of the trauma (e.g., themes of safety, trust, power, control,  esteem, and intimacy); identify and challenge biases; assist him/her in generating appraisals that correct for the biases; test biased and alternatives predictions through behavioral experiments. Objective Participate in Eye Movement Desensitization and Reprocessing (EMDR) to reduce emotional distress  related to traumatic thoughts, feelings, and images. Target Date: 2022-12-24 Frequency: weekly Progress: 0 Modality: individual Related Interventions 1. Utilize Eye Movement Desensitization and Reprocessing (EMDR) to reduce the client's  emotional reactivity to the traumatic event and reduce PTSD symptoms.  Diagnosis  F43.10 (Posttraumatic stress disorder) - Open - [Signifier: n/a] Posttraumatic Stress  Disorder  Conditions For Discharge Achievement of treatment goals and objectives   Adalei Novell G Mayling Aber, LCSW

## 2022-06-21 ENCOUNTER — Ambulatory Visit (INDEPENDENT_AMBULATORY_CARE_PROVIDER_SITE_OTHER): Payer: Medicare Other | Admitting: Psychology

## 2022-06-21 DIAGNOSIS — F509 Eating disorder, unspecified: Secondary | ICD-10-CM

## 2022-06-21 DIAGNOSIS — F4310 Post-traumatic stress disorder, unspecified: Secondary | ICD-10-CM | POA: Diagnosis not present

## 2022-06-21 DIAGNOSIS — F411 Generalized anxiety disorder: Secondary | ICD-10-CM

## 2022-06-21 DIAGNOSIS — F603 Borderline personality disorder: Secondary | ICD-10-CM

## 2022-06-22 NOTE — Progress Notes (Signed)
[facility] Counselor/Therapist Progress Note  Patient ID: [name], MRN: [id number],    Date: 06/21/2022  Time Spent: 60 minutes  Treatment Type: Individual Therapy  Reported Symptoms: dissociation, panic attacks, anxiety  Mental Status Exam: Appearance:  Casual     Behavior: Attention-Seeking  Motor: Restlestness  Speech/Language:  Normal Rate  Affect: Blunt  Mood: anxious  Thought process: concrete  Thought content:   Questionable delusions  Sensory/Perceptual disturbances:   WNL  Orientation: oriented to person, place, time/date, and situation  Attention: Poor  Concentration: Fair  Memory: WNL  Fund of knowledge:  Good  Insight:   Poor  Judgment:  Poor  Impulse Control: Fair   Risk Assessment: Danger to Self:  No Self-injurious Behavior: No Danger to Others: No Duty to Warn:no Physical Aggression / Violence:No  Access to Firearms a concern: No  Gang Involvement:No   Subjective: The patient attended a face-to-face individual therapy session in the office today.  The patient presents with a blunted affect and mood is anxious.  The patient reports that she went for testing with a neuropsychologist and has to go back for interpretation and further testing.  Apparently the neuropsychologist thinks that she definitely has PTSD from several different events.  She explained to her about dissociation.  I had already explained all this to her but it seems that maybe she is absorbing it a little better now.  She still says that she believes that what is going on with her is physical and that it cannot be just anxiety.  I explained to her that she is doing exactly what she needs to do to rule things out.  In addition we talked about the reason that she is having the dissociation is that she does not feel secure enough in herself to make decisions and I do believe that some of her anxiety and dissociation is to keep her from actually dealing with her mother's  death and the other losses that she has experienced.  She seems to have a little awareness yesterday about that she does avoid dealing with things and goes straight back to keeping herself busy.  I believe that she does not really know how to make decisions or soothe herself when the time comes because her mother did it so much.  We will continue to work on helping her deal with her symptoms and we also talked about the possibility that she might want to go to a psychiatrist and talked about where she might be able to make an appointment.  Interventions: Cognitive Behavioral Therapy, Dialectical Behavioral Therapy, Solution-Oriented/Positive Psychology, Eye Movement Desensitization and Reprocessing (EMDR), and Insight-Oriented  Diagnosis:Borderline personality disorder in adult [facility])  GAD (generalized anxiety disorder)  Eating disorder, unspecified type  PTSD (post-traumatic stress disorder)  Plan: Treatment Plan Client Abilities/Strengths  Intelligent, kind, motivated  Client Treatment Preferences  Outpatient Trauma therapy  Client Statement of Needs  "I need some  help dealing with my anxiety and PTSD" Treatment Level  Outpatient Individual therapy  Symptoms  Describes a reliving of the event, particularly through dissociative flashbacks.:  (Status: maintained). Displays a significant decline in interest and engagement in activities.:  (Status: maintained). Displays significant psychological and/or physiological  distress resulting from internal and external clues that are reminiscent of the traumatic event.: (Status: maintained). Experiences disturbances in sleep.:   (Status: maintained). Experiences disturbing and persistent thoughts, images, and/or perceptions of the  traumatic event.: (Status: maintained). Experiences frequent nightmares.: No   Hypervigilance (e.g., feeling constantly on  edge, experiencing concentration difficulties, having trouble falling or staying asleep,  exhibiting a general state of irritability).:  (Status:  maintained). Impairment in social, occupational, or other areas of functioning.:  (Status: maintained). Intentionally avoids activities, places, people, or objects (e.g., up-armored vehicles) that evoke memories of the event.:  (Status: maintained). Intentionally avoids  thoughts, feelings, or discussions related to the traumatic event.:  (Status:  maintained). Reports difficulty concentrating as well as feelings of guilt.:   (Status: maintained). Reports response of intense fear, helplessness, or horror to the traumatic event.:  (Status: maintained). Symptoms present more than one month.: (Status: maintained).   Problems Addressed  Anxiety, PTSD  Goals 1. Enhance ability to effectively cope with the full variety of life's worries  and anxieties. Objective Identify and engage in pleasant activities on a daily basis. Target Date: 2022-12-24 Frequency: weekly Progress: 0 Modality: individual Objective Identify, challenge, and replace biased, fearful self-talk with positive, realistic, and empowering selftalk. Target Date: 2022-12-24 Frequency: weekly Progress: 0 Modality: individual Related Interventions 1. Assign the client a homework exercise in which he/she identifies fearful self-talk, identifies  biases in the self-talk, generates alternatives, and tests through behavioral experiments (or  assign "Negative Thoughts Trigger Negative Feelings" in the Adult Psychotherapy Homework  Planner by [facility]); review and reinforce success, providing corrective feedback toward  improvement. 2. Explore the client's schema and self-talk that mediate his/her fear response; assist him/her in  challenging the biases; replace the distorted messages with reality-based alternatives and  positive, realistic self-talk that will increase his/her self-confidence in coping with irrational  fears (see Cognitive Therapy of Anxiety Disorders by Laurence Slate).  2. No longer avoids persons, places, activities, and objects that are  reminiscent of the traumatic event. 3. No longer experiences intrusive event recollections, avoidance of event  reminders, intense arousal, or disinterest in activities or  relationships. 4. Stabilize anxiety level while increasing ability to function on a daily  basis. 5. Thinks about or openly discusses the traumatic event with others  without experiencing psychological or physiological distress. Objective Participate in Cognitive Therapy to help identify, challenge, and replace biased, negative, and selfdefeating thoughts resulting from the trauma. Target Date: 2022-12-24 Frequency: weekly Progress: 10 Modality: individual Related Interventions 1. Using Cognitive Therapy techniques, explore the client's self-talk and beliefs about self, others,  and the future that are a consequence of the trauma (e.g., themes of safety, trust, power, control,  esteem, and intimacy); identify and challenge biases; assist him/her in generating appraisals that correct for the biases; test biased and alternatives predictions through behavioral experiments. Objective Participate in Eye Movement Desensitization and Reprocessing (EMDR) to reduce emotional distress  related to traumatic thoughts, feelings, and images. Target Date: 2022-12-24 Frequency: weekly Progress: 0 Modality: individual Related Interventions 1. Utilize Eye Movement Desensitization and Reprocessing (EMDR) to reduce the client's  emotional reactivity to the traumatic event and reduce PTSD symptoms.  Diagnosis  F43.10 (Posttraumatic stress disorder) - Open - [Signifier: n/a] Posttraumatic Stress  Disorder  Conditions For Discharge Achievement of treatment goals and objectives   Patient approves of this plan.  Keali Mccraw G Jonique Kulig, LCSW

## 2022-06-28 ENCOUNTER — Ambulatory Visit: Payer: Medicare Other | Admitting: Psychology

## 2022-07-05 ENCOUNTER — Ambulatory Visit: Payer: Medicare Other | Admitting: Psychology

## 2022-07-12 ENCOUNTER — Ambulatory Visit: Payer: Medicare Other | Admitting: Psychology

## 2022-07-19 ENCOUNTER — Ambulatory Visit: Payer: Medicare Other | Admitting: Psychology

## 2022-07-26 ENCOUNTER — Ambulatory Visit: Payer: Medicare Other | Admitting: Psychology

## 2022-08-02 ENCOUNTER — Ambulatory Visit: Payer: Medicare Other | Admitting: Psychology

## 2022-08-09 ENCOUNTER — Ambulatory Visit: Payer: Medicare Other | Admitting: Psychology

## 2022-09-27 ENCOUNTER — Ambulatory Visit: Payer: Medicare Other | Admitting: Psychology

## 2022-10-04 ENCOUNTER — Ambulatory Visit: Payer: Medicare Other | Admitting: Psychology

## 2022-10-11 ENCOUNTER — Ambulatory Visit: Payer: Medicare Other | Admitting: Psychology

## 2022-10-18 ENCOUNTER — Ambulatory Visit: Payer: Medicare Other | Admitting: Psychology

## 2022-10-25 ENCOUNTER — Ambulatory Visit: Payer: Medicare Other | Admitting: Psychology

## 2022-11-01 ENCOUNTER — Ambulatory Visit: Payer: Medicare Other | Admitting: Psychology

## 2022-11-08 ENCOUNTER — Ambulatory Visit: Payer: Medicare Other | Admitting: Psychology

## 2022-11-10 ENCOUNTER — Ambulatory Visit (INDEPENDENT_AMBULATORY_CARE_PROVIDER_SITE_OTHER): Payer: Medicare Other | Admitting: Internal Medicine

## 2022-11-10 ENCOUNTER — Encounter: Payer: Self-pay | Admitting: Internal Medicine

## 2022-11-10 ENCOUNTER — Telehealth: Payer: Self-pay | Admitting: Internal Medicine

## 2022-11-10 VITALS — BP 104/68 | HR 70 | Ht 63.0 in

## 2022-11-10 DIAGNOSIS — E559 Vitamin D deficiency, unspecified: Secondary | ICD-10-CM | POA: Diagnosis not present

## 2022-11-10 DIAGNOSIS — M810 Age-related osteoporosis without current pathological fracture: Secondary | ICD-10-CM

## 2022-11-10 LAB — BASIC METABOLIC PANEL
BUN: 22 mg/dL (ref 6–23)
CO2: 30 meq/L (ref 19–32)
Calcium: 9.7 mg/dL (ref 8.4–10.5)
Chloride: 100 meq/L (ref 96–112)
Creatinine, Ser: 0.81 mg/dL (ref 0.40–1.20)
GFR: 74.4 mL/min (ref >60.00)
Glucose, Bld: 83 mg/dL (ref 70–99)
Potassium: 3.9 meq/L (ref 3.5–5.1)
Sodium: 138 meq/L (ref 135–145)

## 2022-11-10 LAB — ALBUMIN: Albumin: 4.3 g/dL (ref 3.5–5.2)

## 2022-11-10 LAB — VITAMIN D 25 HYDROXY (VIT D DEFICIENCY, FRACTURES): VITD: 20.09 ng/mL — ABNORMAL LOW (ref 30.00–100.00)

## 2022-11-10 LAB — PHOSPHORUS: Phosphorus: 3.7 mg/dL (ref 2.3–4.6)

## 2022-11-10 NOTE — Telephone Encounter (Signed)
Can you please add this patient to the Prolia list?    Thanks

## 2022-11-10 NOTE — Patient Instructions (Addendum)
Please take calcium citrate 1200 mg total a day

## 2022-11-10 NOTE — Progress Notes (Unsigned)
Name: [name]  MRN/ DOB: [id number], 09/25/1955    Age/ Sex: 67 y.o., female    PCP: Porfirio Oar, PA   Reason for Endocrinology Evaluation: Osteoporosis     Date of Initial Endocrinology Evaluation: 11/10/2022     HPI: [name] is a 67 y.o. female with a past medical history of ocular migraines, dyslipidemia, osteoporosis. The patient presented for initial endocrinology clinic visit on 11/10/2022 for consultative assistance with her osteoporosis.   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[employer] She recently injured her back and has left sided lower back pain  She has hx of eating disorder, started working with a dietician 2 days ago  Denies recent falls     HISTORY:  Past Medical History:  Past Medical History:  Diagnosis Date   Acute pain of right knee 03/07/2017   Anemia    Anorexia    Anxiety    Bitemporal hemianopsia    Borderline personality disorder (HCC)    Bulimia    Depression    Hyperlipidemia    Ocular migraine    Osteoporosis    Pigmentary retinal dystrophy    Retinitis pigmentosa    TBI (traumatic brain injury) (HCC)    Past Surgical History:  Past Surgical History:  Procedure Laterality Date   CESAREAN SECTION      Social History:  reports that she has never smoked. She has never used smokeless tobacco. She reports that she does not drink alcohol and does not use drugs. Family History: family history includes Cancer in her father; Diabetes in her sister; Heart disease in her father; Osteoporosis in her mother; Polymyalgia rheumatica in her  mother; Stomach cancer in her maternal grandfather.   HOME MEDICATIONS: Allergies as of 11/10/2022       Reactions   Carbamazepine Palpitations   Prochlorperazine Edisylate    Muscle tremors Other reaction(s): Other   Keflex [cephalexin] Rash, Other (See Comments)   Other: thrush in mouth   Augmentin [amoxicillin-pot Clavulanate] Diarrhea   Prochlorperazine Other (See Comments)   Epinephrine Palpitations, Other (See Comments)        Medication List        Accurate as of November 10, 2022  1:12 PM. If you have any questions, ask your nurse or doctor.          STOP taking these medications    famotidine 40 MG tablet Commonly known as: PEPCID Stopped by: Johnney Ou Hannahmarie Asberry   methocarbamol 500 MG tablet Commonly known as: ROBAXIN Stopped by: Johnney Ou Celisa Schoenberg   predniSONE 10 MG (21) Tbpk tablet Commonly known as: STERAPRED UNI-PAK 21 TAB Stopped by: Johnney Ou Gwynneth Fabio   PROBIOTIC PO Stopped by: Johnney Ou Jenisis Harmsen       TAKE these medications    Calcium 200 MG Tabs   clonazePAM 0.5 MG tablet Commonly known as: KLONOPIN Take 0.5 mg by mouth daily.   clotrimazole-betamethasone cream Commonly known as: LOTRISONE   ondansetron 4 MG disintegrating tablet Commonly known as: ZOFRAN-ODT Take 4 mg by mouth  every 8 (eight) hours as needed.   pantoprazole 40 MG tablet Commonly known as: PROTONIX Take 1 tablet (40 mg total) by mouth 2 (two) times daily.   pantoprazole 40 MG tablet Commonly known as: PROTONIX Take by mouth.   PARoxetine 20 MG tablet Commonly known as: PAXIL Take 30 mg by mouth daily.   PARoxetine 10 MG tablet Commonly known as: PAXIL Take 10 mg by mouth daily.   PARoxetine 20 MG tablet Commonly known as: PAXIL Take by mouth.   polyethylene glycol 17 g packet Commonly known as: MIRALAX / GLYCOLAX as needed.   prazosin 1 MG capsule Commonly known as: MINIPRESS Take 1 mg by mouth at bedtime.   rosuvastatin 20 MG  tablet Commonly known as: CRESTOR Take by mouth.   sucralfate 1 GM/10ML suspension Commonly known as: CARAFATE Take by mouth.          REVIEW OF SYSTEMS: A comprehensive ROS was conducted with the patient and is negative except as per HPI     OBJECTIVE:  VS: BP 104/68 (BP Location: Left Arm, Patient Position: Sitting, Cuff Size: Small)   Pulse 70   Ht 5\' 3"  (1.6 m)   LMP 02/18/2004   SpO2 96%   BMI 23.03 kg/m    Wt Readings from Last 3 Encounters:  02/23/22 130 lb (59 kg)  02/15/22 130 lb 1.1 oz (59 kg)  01/19/22 130 lb (59 kg)     EXAM: General: Pt appears well and is in NAD  Eyes: External eye exam normal without stare, lid lag or exophthalmos.  EOM intact.  PERRL.  Neck: General: Supple without adenopathy. Thyroid: Thyroid size normal.  No goiter or nodules appreciated. No thyroid bruit.  Lungs: Clear with good BS bilat   Heart: Auscultation: RRR.  Abdomen: Soft, nontender  Extremities:  BL LE: No pretibial edema   Mental Status: Judgment, insight: Intact Orientation: Oriented to time, place, and person Mood and affect: No depression, anxiety, or agitation     DATA REVIEWED: ***    09/12/2022 Change from 2019  AP -1.9 Down 1%  RFN -2.8 0.0  RTH -2.8   LFN -3.0 Down 2%  LTH -2.8     ASSESSMENT/PLAN/RECOMMENDATIONS:   Osteoporosis  -No history of fragility fractures -Patient is not on calcium nor vitamin D, emphasized the importance of optimizing calcium and vitamin D, given that she is on chronic PPI therapy, I have recommended calcium citrate rather than calcium carbonate for better absorption profile -She was on alendronate at some point, given GI symptoms, I would recommend avoiding oral medications -I have recommended Prolia, discussed side effects to include local reaction, rash, and hypocalcemia -She does well with exercise including weightbearing, and agility  Medications : Calcium citrate 1200 mg daily    Follow-up 6 months Signed  electronically by: Lyndle Herrlich, MD  [facility] Endocrinology  [facility] [address] [place], [place] [address] Phone: [phone] FAX: [phone]   CC: Porfirio Oar, PA [address] [place] [address] Phone: [phone] Fax: [phone]   Return to Endocrinology clinic as below: No future appointments.

## 2022-11-11 LAB — PARATHYROID HORMONE, INTACT (NO CA): PTH: 47 pg/mL (ref 16–77)

## 2022-11-11 NOTE — Telephone Encounter (Signed)
Prolia VOB initiated via [email or web address]  Next Prolia inj DUE: new start

## 2022-11-13 DIAGNOSIS — E559 Vitamin D deficiency, unspecified: Secondary | ICD-10-CM | POA: Insufficient documentation

## 2022-11-16 ENCOUNTER — Encounter: Payer: Self-pay | Admitting: Internal Medicine

## 2022-11-18 NOTE — Telephone Encounter (Signed)
No Prior Auth required for [facility]

## 2022-11-26 NOTE — Telephone Encounter (Signed)
NO Prior Auth required for [facility]

## 2022-12-14 MED ORDER — DENOSUMAB 60 MG/ML ~~LOC~~ SOSY
60.0000 mg | PREFILLED_SYRINGE | Freq: Once | SUBCUTANEOUS | Status: AC
Start: 2022-12-28 — End: ?

## 2022-12-14 NOTE — Addendum Note (Signed)
Addended by: Lisabeth Pick on: 12/14/2022 08:44 AM   Modules accepted: Orders

## 2022-12-17 NOTE — Telephone Encounter (Signed)
Pt ready for scheduling on or after 12/18/22  Out-of-pocket cost due at time of visit: $346  Primary: Medicare Prolia co-insurance: 20% (approximately $320.99) Admin fee co-insurance: 20% (approximately $25)  Deductible: $240 of $240 met  Secondary: [employer] Medicare Supplement Plan G Prolia co-insurance: Covers Medicare Part B co-insurance Admin fee co-insurance: Covers Medicare Part B co-insurance  Deductible: n/a  Prior Auth: NOT required PA# Valid:   ** This summary of benefits is an estimation of the patient's out-of-pocket cost. Exact cost may vary based on individual plan coverage.

## 2022-12-22 ENCOUNTER — Ambulatory Visit (INDEPENDENT_AMBULATORY_CARE_PROVIDER_SITE_OTHER): Payer: Medicare Other

## 2022-12-22 VITALS — BP 104/68 | HR 81 | Ht 63.0 in | Wt 129.0 lb

## 2022-12-22 DIAGNOSIS — M810 Age-related osteoporosis without current pathological fracture: Secondary | ICD-10-CM | POA: Diagnosis not present

## 2022-12-22 MED ORDER — DENOSUMAB 60 MG/ML ~~LOC~~ SOSY
60.0000 mg | PREFILLED_SYRINGE | Freq: Once | SUBCUTANEOUS | Status: AC
  Administered 2022-12-22: 60 mg via SUBCUTANEOUS

## 2022-12-22 MED ORDER — DENOSUMAB 60 MG/ML ~~LOC~~ SOSY
60.0000 mg | PREFILLED_SYRINGE | Freq: Once | SUBCUTANEOUS | Status: AC
  Administered 2023-06-26: 60 mg via SUBCUTANEOUS

## 2022-12-22 NOTE — Progress Notes (Signed)
After obtaining consent, and per orders of Dr.Shamleffer, injection of Prolia 60mg   given by Kalven Ganim L Christophr Calix in right SQ. Patient instructed to remain in clinic for 20 minutes afterwards, and to report any adverse reaction to me immediately.

## 2022-12-23 NOTE — Telephone Encounter (Signed)
Last Prolia inj 12/22/22 Next Prolia inj due 06/22/23

## 2023-05-11 ENCOUNTER — Ambulatory Visit: Payer: Medicare Other | Admitting: Internal Medicine

## 2023-05-11 ENCOUNTER — Encounter: Payer: Self-pay | Admitting: Internal Medicine

## 2023-05-11 VITALS — BP 104/68 | HR 70 | Ht 63.0 in

## 2023-05-11 DIAGNOSIS — M810 Age-related osteoporosis without current pathological fracture: Secondary | ICD-10-CM | POA: Diagnosis not present

## 2023-05-11 DIAGNOSIS — E559 Vitamin D deficiency, unspecified: Secondary | ICD-10-CM

## 2023-05-11 DIAGNOSIS — E211 Secondary hyperparathyroidism, not elsewhere classified: Secondary | ICD-10-CM | POA: Diagnosis not present

## 2023-05-11 NOTE — Patient Instructions (Signed)
Please take calcium citrate 1200 mg total a day

## 2023-05-11 NOTE — Progress Notes (Signed)
 Name: [name]  MRN/ DOB: [id number], 10/07/1955    Age/ Sex: 68 y.o., female    PCP: Porfirio Oar, PA   Reason for Endocrinology Evaluation: Osteoporosis     Date of Initial Endocrinology Evaluation: 11/10/2022    HPI: [name] is a 68 y.o. female with a past medical history of ocular migraines, dyslipidemia, osteoporosis. The patient presented for initial endocrinology clinic visit on 11/10/2022 for consultative assistance with her osteoporosis.   Pt was diagnosed with osteoporosis:2019  Menarche at age : 10 Menopausal at age : 40 Fracture Hx: no Hx of HRT: no FH of osteoporosis or hip fracture: mother  Prior Hx of anti-resorptive therapy : fosamax many years ago.  Prolia 12/22/2022   She has hx of binge eating disorder   SUBJECTIVE:    Today (05/11/23):  [name] is here for follow-up on osteoporosis.    She did receive Prolia 12/22/2022,she did not have any side effects   Continued  with chronic GI issues, on PPI and sucralfate  Historically has chronic constipation but now has loose stools on the day when she wakes up anxious. Patient is complaining of bodyaches and pains as well as headaches.  Patient states she has been told she has low phosphorus  Calcium citrate 1200 mg daily Ergocalciferol 50,000 international units weekly   HISTORY:  Past Medical History:  Past Medical History:  Diagnosis Date   Acute pain of right knee 03/07/2017   Anemia    Anorexia    Anxiety    Bitemporal hemianopsia    Borderline personality disorder (HCC)    Bulimia    Depression    Hyperlipidemia    Ocular migraine    Osteoporosis    Pigmentary retinal dystrophy    Retinitis pigmentosa    TBI (traumatic brain injury) (HCC)    Past Surgical History:  Past Surgical History:  Procedure Laterality Date   CESAREAN SECTION      Social History:  reports that she has never smoked. She has never used smokeless tobacco. She reports that she does  not drink alcohol and does not use drugs. Family History: family history includes Cancer in her father; Diabetes in her sister; Heart disease in her father; Osteoporosis in her mother; Polymyalgia rheumatica in her mother; Stomach cancer in her maternal grandfather.   HOME MEDICATIONS: Allergies as of 05/11/2023       Reactions   Carbamazepine Palpitations   Prochlorperazine Edisylate    Muscle tremors Other reaction(s): Other   Keflex [cephalexin] Rash, Other (See Comments)   Other: thrush in mouth   Augmentin [amoxicillin-pot Clavulanate] Diarrhea   Prochlorperazine Other (See Comments)   Epinephrine Palpitations, Other (See Comments)        Medication List        Accurate as of May 11, 2023  1:24 PM. If you have any questions, ask your nurse or doctor.          STOP taking these medications    clotrimazole-betamethasone cream Commonly known as: LOTRISONE Stopped by: Johnney Ou Emelyn Roen   prazosin 1 MG capsule Commonly known as: MINIPRESS Stopped by: Johnney Ou Lory Galan       TAKE these medications    Calcium 200 MG Tabs   clonazePAM 0.5 MG tablet Commonly known as: KLONOPIN Take 0.5 mg by mouth daily.   famotidine 20 MG tablet Commonly known as: PEPCID Take 20 mg by mouth 2 (two) times daily.   ondansetron 4 MG disintegrating tablet Commonly known  as: ZOFRAN-ODT Take 4 mg by mouth every 8 (eight) hours as needed.   pantoprazole 40 MG tablet Commonly known as: PROTONIX Take 1 tablet (40 mg total) by mouth 2 (two) times daily.   pantoprazole 40 MG tablet Commonly known as: PROTONIX Take by mouth.   PARoxetine 30 MG tablet Commonly known as: PAXIL Take 30 mg by mouth daily. What changed: Another medication with the same name was removed. Continue taking this medication, and follow the directions you see here. Changed by: Johnney Ou Jeston Junkins   Plecanatide 3 MG Tabs Take by mouth.   polyethylene glycol 17 g packet Commonly known as:  MIRALAX / GLYCOLAX as needed.   QUEtiapine 25 MG tablet Commonly known as: SEROQUEL Take by mouth.   rosuvastatin 20 MG tablet Commonly known as: CRESTOR Take by mouth.   sucralfate 1 GM/10ML suspension Commonly known as: CARAFATE Take by mouth.   Vitamin D (Ergocalciferol) 1.25 MG (50000 UNIT) Caps capsule Commonly known as: DRISDOL Take by mouth.          REVIEW OF SYSTEMS: A comprehensive ROS was conducted with the patient and is negative except as per HPI     OBJECTIVE:  VS: BP 104/68 (BP Location: Left Arm, Patient Position: Sitting, Cuff Size: Small)   Pulse 70   Ht 5\' 3"  (1.6 m)   LMP 02/18/2004   SpO2 95%   BMI 22.85 kg/m    Wt Readings from Last 3 Encounters:  12/22/22 129 lb (58.5 kg)  02/23/22 130 lb (59 kg)  02/15/22 130 lb 1.1 oz (59 kg)     EXAM: General: Pt appears well and is in NAD  Lungs: Clear with good BS bilat   Heart: Auscultation: RRR.  Abdomen: Soft, nontender  Extremities:  BL LE: No pretibial edema   Mental Status: Judgment, insight: Intact Orientation: Oriented to time, place, and person Mood and affect: No depression, anxiety, or agitation     DATA REVIEWED:  Latest Reference Range & Units 05/11/23 13:37  Sodium 135 - 146 mmol/L 137  Potassium 3.5 - 5.3 mmol/L 4.9  Chloride 98 - 110 mmol/L 99  CO2 20 - 32 mmol/L 33 (H)  Glucose 65 - 99 mg/dL 85  BUN 7 - 25 mg/dL 18  Creatinine 4.09 - 8.11 mg/dL 9.14  Calcium 8.6 - 78.2 mg/dL 9.4  BUN/Creatinine Ratio 6 - 22 (calc) SEE NOTE:  eGFR > OR = 60 mL/min/1.25m2 95     Latest Reference Range & Units 05/11/23 13:37  Vitamin D, 25-Hydroxy 30 - 100 ng/mL 33    Latest Reference Range & Units 05/11/23 13:37  PTH, Intact 16 - 77 pg/mL 117 (H)  Albumin MSPROF 3.6 - 5.1 g/dL 4.4      Latest Reference Range & Units 11/10/22 13:38  Sodium 135 - 145 mEq/L 138  Potassium 3.5 - 5.1 mEq/L 3.9  Chloride 96 - 112 mEq/L 100  CO2 19 - 32 mEq/L 30  Glucose 70 - 99 mg/dL 83  BUN 6  - 23 mg/dL 22  Creatinine 9.56 - 2.13 mg/dL 0.86  Calcium 8.4 - 57.8 mg/dL 9.7  Phosphorus 2.3 - 4.6 mg/dL 3.7  Albumin 3.5 - 5.2 g/dL 4.3  GFR >46.96 mL/min 74.40    Latest Reference Range & Units 11/10/22 13:38  VITD 30.00 - 100.00 ng/mL 20.09 (L)    Latest Reference Range & Units 11/10/22 13:38  PTH, Intact 16 - 77 pg/mL 47        09/12/2022 Change from 2019  AP -1.9 Down 1%  RFN -2.8 0.0  RTH -2.8   LFN -3.0 Down 2%  LTH -2.8     ASSESSMENT/PLAN/RECOMMENDATIONS:   Osteoporosis  -No history of fragility fractures -She was on alendronate at some point, given GI symptoms, I would recommend avoiding oral medications -She does well with exercise including weightbearing, and agility -She is tolerating Prolia, we will continue -Emphasized the importance of optimizing calcium and vitamin D intake  Medications : Calcium citrate 1200 mg daily   2. Vitamin D Insufficiency :   -Vitamin D is normal   Continue ergocalciferol 50,000 international unit weekly   3.  Low phosphorus:  -Patient on restrictive diet.  We discussed the importance of consuming a well-balanced diet.  Patient to increase intake of phosphorus rich diet such as meats, poultry, nuts, and dairy.  Which she typically does not do nor does she take a multivitamin  -Patient advised to start a multivitamin  4. Secondary hyperparathyroidism :   -I suspect due to low calcium intake -She is already on calcium citrate -I have again suggested adding a multivitamin that will contain extra calcium and vitamin D   Follow-up 1 yr   I spent 25 minutes preparing to see the patient by review of recent labs, imaging and procedures, obtaining and reviewing separately obtained history, communicating with the patient/family or caregiver, ordering medications, tests or procedures, and documenting clinical information in the EHR including the differential Dx, treatment, and any further evaluation and other management     Signed electronically by: Lyndle Herrlich, MD  [facility]  [facility] [address] [place], [place] [address] Phone: [phone] FAX: [phone]   CC: Porfirio Oar, PA [address] [place] [address] Phone: [phone] Fax: [phone]   Return to Endocrinology clinic as below: Future Appointments  Date Time Provider Department Center  06/22/2023  1:30 PM [facility] NURSE [facility] None

## 2023-05-12 LAB — BASIC METABOLIC PANEL WITH GFR
BUN: 18 mg/dL (ref 7–25)
CO2: 33 mmol/L — ABNORMAL HIGH (ref 20–32)
Calcium: 9.4 mg/dL (ref 8.6–10.4)
Chloride: 99 mmol/L (ref 98–110)
Creat: 0.67 mg/dL (ref 0.50–1.05)
Glucose, Bld: 85 mg/dL (ref 65–99)
Potassium: 4.9 mmol/L (ref 3.5–5.3)
Sodium: 137 mmol/L (ref 135–146)
eGFR: 95 mL/min/{1.73_m2} (ref >=60)

## 2023-05-12 LAB — VITAMIN D 25 HYDROXY (VIT D DEFICIENCY, FRACTURES): Vit D, 25-Hydroxy: 33 ng/mL (ref 30–100)

## 2023-05-12 LAB — PARATHYROID HORMONE, INTACT (NO CA): PTH: 117 pg/mL — ABNORMAL HIGH (ref 16–77)

## 2023-05-12 LAB — ALBUMIN: Albumin: 4.4 g/dL (ref 3.6–5.1)

## 2023-05-15 ENCOUNTER — Encounter: Payer: Self-pay | Admitting: Internal Medicine

## 2023-05-26 NOTE — Telephone Encounter (Signed)
 Medical [employer] and Raenette Bumps - Prior Authorization REQUIRED for [employer]   PA PROCESS DETAILS: PA is required and is currently not on file. Please call Medical Review at [phone] or complete the PA form available at  [email or web address]   Fax completed form to [phone].  PA request can be submitted using the online portal at [email or web address].

## 2023-06-13 NOTE — Telephone Encounter (Signed)
 Prior Authorization initiated for Prolia  via [employer] Case ID: [id number]  Status: Tech Review

## 2023-06-14 ENCOUNTER — Telehealth: Payer: Self-pay

## 2023-06-15 NOTE — Telephone Encounter (Signed)
 [employer]   Prior Authorization for PROLIA  APPROVED PA# [id number] Valid: 06/14/23-06/13/24

## 2023-06-17 NOTE — Telephone Encounter (Signed)
 Patient is ready for scheduling on or after 06/22/23 BUY AND BILL  Out-of-pocket cost due at time of visit: $361.87  Primary: Medicare Prolia  co-insurance: 20% (approximately $331.87) Admin fee co-insurance: $30  Deductible: does not apply  Prior Auth: APPROVED PA# [id number] Valid: 06/14/23-06/13/24  Secondary: N/A Prolia  co-insurance:  Admin fee co-insurance:  Deductible:  Prior Auth:  PA# Valid:   ** This summary of benefits is an estimation of the patient's out-of-pocket cost. Exact cost may vary based on individual plan coverage.

## 2023-06-19 NOTE — Telephone Encounter (Signed)
 Error

## 2023-06-22 ENCOUNTER — Ambulatory Visit: Payer: Medicare Other

## 2023-06-26 ENCOUNTER — Ambulatory Visit

## 2023-06-26 VITALS — BP 112/68 | HR 67 | Ht 63.0 in

## 2023-06-26 DIAGNOSIS — M810 Age-related osteoporosis without current pathological fracture: Secondary | ICD-10-CM | POA: Diagnosis not present

## 2023-06-26 MED ORDER — DENOSUMAB 60 MG/ML ~~LOC~~ SOSY
60.0000 mg | PREFILLED_SYRINGE | SUBCUTANEOUS | Status: AC
Start: 2023-12-23 — End: 2023-12-28
  Administered 2023-12-28: 60 mg via SUBCUTANEOUS

## 2023-06-26 NOTE — Progress Notes (Signed)
 After obtaining consent, and per orders of Dr. Rosalea Collin, injection of Prolia  60mcg given by Alphonzo Devera L Jezebel Pollet right arm SQ. Patient instructed to remain in clinic for 20 minutes afterwards, and to report any adverse reaction to me immediately.

## 2023-06-28 NOTE — Progress Notes (Addendum)
 Patient:[name]  DOB: December 16, 1955, age 68 y.o. MRN: [id number]  PCP: Bernita Ned, PA-C  Computer technology was used to create visit note. Consent from the patient/caregiver was obtained prior to its use.       Assessment and Plan   1. GAD (generalized anxiety disorder) (Primary) -     clonazePAM  (KLONOPIN ) 0.5 mg tablet; Take one tablet (0.5 mg dose) by mouth daily as needed for Anxiety. Max Daily Amount: 0.5 mg, Starting Thu 06/28/2023, Normal 2. Anorexia nervosa, binge-eating purging type, unspecified severity 3. Binge eating disorder, unspecified severity 4. Nausea 5. Mixed hyperlipidemia -     rosuvastatin  calcium  (CRESTOR ) 10 mg tablet; Take one tablet (10 mg dose) by mouth at bedtime., Starting Thu 06/28/2023, Normal 6. Sleep disorder -     Ambulatory Referral to Sleep Consult    Assessment & Plan 1. Hyperlipidemia. - LDL levels have remained consistently elevated over the past year, with a previous peak of 199 two years ago and a level of 111 four years ago. - Genetic predisposition is suggested rather than a lifestyle issue. - Statins are unlikely to exacerbate her pain, but muscle pain is possible; she was advised to maintain adequate nutrition and hydration. - START statin therapy (previously prescribed, never started).  2. Anxiety. - Currently tapering off Paxil and experiencing brain zaps. - Advised to stay well-nourished and hydrated, and to maintain regular appointments with her therapist, dietitian, and psychiatrist. - Prescription for Klonopin  0.5 mg tablets, to be taken once daily as needed, was provided. - Instructed to follow her psychiatrist's recommendations regarding the timing of Klonopin  administration.  3. Sleep disturbances. - Reports chronic nightmares and waking up in excruciating pain. - Referral to a neurology-based sleep specialist will be made for further assessment and potential testing.  4. Chronic pain. - Experiences  excruciating pain all over, which started before tapering off Paxil. - Pain is unlikely related to statin use as she has never taken it. - Further evaluation for underlying connective tissue or autoimmune disorders will be considered if symptoms persist, at next visit.  5. Eating disorder. - Working on setting boundaries with her friend who also has an eating disorder. - Advised to continue working with her dietitian and therapist to develop a more normalized eating pattern.  6. Chronic constipation. - Not taking Trulance for chronic constipation due to cost concerns. -Long history of stimulant laxative abuse -pelvic floor PT was cost prohibitive  Follow-up - The patient will follow up in 1 month. Plan to update CBC, UA, BMP, phosphorus, vitamin D , and to also check ESR, CRP, ANA, rheumatoid factor   Follow up in about 4 weeks (around 07/26/2023) for re-evaluation of pain, sleep, anxiety disordered eating (BLIND WEIGHT & orthostatics).  Risks, benefits, and alternatives of the medications and treatment plan prescribed today were discussed, and patient expressed understanding. Plan follow-up as discussed or as needed if any worsening symptoms or change in condition.  Patient voiced understanding of the treatment plan and agreed to attempt to comply.      Subjective   This a 68 y.o. female who presents with:     Patient presents with  . Review Lab Results     Chief complaint comments reviewed and discussed with patient in detail.  HPI   This 68 y.o. female presents for evaluation of anxiety, eating disorder, hyperlipidemia, chronic abdominal pain/nausea/constipation.  Our last visit together was 01/23/2023 for ongoing management of disordered eating, anxiety, and complicated grief.  She  had recently undergone evaluation at the [facility] clinic and we reviewed the results and recommendations for multiple supplements, which she found cost prohibitive.  In the interim, she has seen 2 of  my colleagues for evaluation of longstanding and ongoing abdominal pain attributed to slow transit constipation, having undergone thorough evaluation with GI, but desiring alternative explanation.  She had follow-up with endocrinology on 05/11/2023 related to osteoporosis.  She was advised to continue optimizing calcium  and vitamin D  supplementation, continue with exercise including weightbearing and agility.  As she was tolerating Prolia , it was continued.  She has established with a new psychiatrist who, as a strictly telehealth provider, is unable to prescribe controlled substances.  As such, I took over prescribing clonazepam  in mid April, as the plan was to cross titrate off Paxil and onto a new agent, then taper clonazepam .  Initially, prazosin was added for nightmares.  She was subsequently started on Latuda 40 mg for management of mood and anxiety.  She related she had not been taking the prescribed statin so anticipated lipids would be elevated when labs drawn for routine monitoring of antipsychotics were performed yesterday.  Psychiatry relates they expect to have tapered her off paroxetine in the next few weeks and then consider tapering clonazepam .  History of Present Illness The patient presents today for evaluation of recent labs that were drawn at the request of her psychiatrist as she is now taking Latuda.  She has been experiencing severe, constant pain, which she describes as similar to fibromyalgia. This pain began approximately a year ago, prior to the initiation of her Paxil tapering, and has progressively worsened. She also reports frequent headaches and a sensation of cotton in her head. She has not taken the previously prescribed statin (rosuvastatin ) and therefore cannot attribute her pain to that.  She is currently undergoing therapy for severe and longstanding generalized anxiety disorder complicated by PTSD, panic, dissociation and complicated grief,   Medication-related fears,  including concerns about potential seizures or heart attacks. She is in the process of reducing her Paxil dosage, which has been challenging due to the occurrence of brain zaps. She reports waking up early to manage her anxiety before leaving the house. She experiences chronic nightmares, which persist despite Seroquel  treatment. She also reports feeling disoriented and having visual disturbances, particularly when fasting and off her medications. She is working with her therapist on strategies to manage these symptoms. She is currently on Klonopin  0.5 mg daily and is seeking a refill. She is also on Latuda, which she takes with a minimum of 360 calories to ensure optimal absorption. She is considering discontinuing all medications in hopes of reducing her head fuzziness.  She is interested in undergoing a sleep study to investigate potential sleep disturbances, as she wakes up nightly at 3:20 AM with severe nausea and dissociation. She is curious if she is getting into REM sleep. She is on Seroquel  for sleep.  She has a history of disordered eating and is working with her dietitian to establish a balanced eating pattern. She vomited yesterday due to persistent nausea, not binge-purge behavior. She is also working on setting boundaries with a friend who also has an eating disorder.  She is not taking Trulance for chronic constipation due to cost concerns. She was seeing a pelvic floor specialist but discontinued due to financial constraints.   Reviewed and updated this visit by provider: Tobacco  Allergies  Meds  Problems  Med Hx  Surg Hx  Fam Hx  Review of Systems  Constitutional:  Positive for fatigue. Negative for chills and fever.  Respiratory:  Negative for shortness of breath.   Cardiovascular:  Negative for chest pain.  Gastrointestinal:  Positive for abdominal pain, constipation, nausea and vomiting.  Musculoskeletal:  Positive for arthralgias and myalgias.   Psychiatric/Behavioral:  Positive for confusion, decreased concentration, dysphoric mood and sleep disturbance. The patient is nervous/anxious.          Objective   Vitals:   06/28/23 0810  BP: 110/72  Patient Position: Sitting  Pulse: 77  Temp: 97.7 F (36.5 C)  TempSrc: Temporal  Resp: 17  Height: 5' 3 (1.6 m)  Weight: Comment: refused  SpO2: 96%    Physical Exam Constitutional:      General: She is awake. She is not in acute distress.    Appearance: Normal appearance. She is well-developed and well-groomed. She is not ill-appearing.  HENT:     Head: Normocephalic.     Right Ear: Hearing normal.     Left Ear: Hearing normal.  Eyes:     General: Lids are normal. No scleral icterus.       Right eye: No discharge.        Left eye: No discharge.     Conjunctiva/sclera: Conjunctivae normal.  Cardiovascular:     Rate and Rhythm: Normal rate.  Pulmonary:     Effort: Pulmonary effort is normal.  Skin:    General: Skin is warm and dry.  Neurological:     Mental Status: She is alert and oriented to person, place, and time.  Psychiatric:        Attention and Perception: Attention and perception normal.        Mood and Affect: Mood and affect normal.        Speech: Speech normal.        Behavior: Behavior normal. Behavior is cooperative.     Results for orders placed or performed in visit on 06/27/23  CBC And Differential   Collection Time: 06/27/23  8:03 AM  Result Value Ref Range   WBC 4.4 3.7 - 11.0 thou/mcL   RBC 4.47 4.01 - 4.90 million/mcL   HGB 13.1 12.2 - 14.9 gm/dL   HCT 61.2 64.1 - 52.0 %   MCV 86.5 82.0 - 98.0 fL   MCH 29.4 27.0 - 33.0 pg   MCHC 33.9 31.0 - 37.0 gm/dL   Plt Ct 764 849 - 599 thou/mcL   RDW CV 13.6 11.8 - 14.9 %   NEUTROPHIL % 57.2 %   LYMPHOCYTE % 36.7 %   MID% 6.1 %   ABSOLUTE NEUTROPHIL COUNT 2.50 1.50 - 7.50 thou/mcL   ABSOLUTE LYMPHOCYTE COUNT 1.60 1.00 - 4.50 thou/mcL   ABSOLUTE MID <0.5 0.1 - 1.5 thou/mcL  POCT A1C    Collection Time: 06/27/23  8:03 AM  Result Value Ref Range   Hemoglobin A1c 5.2 4.8 - 5.6 %  Lipid Panel With LDL/HDL Ratio   Collection Time: 06/27/23  8:08 AM  Result Value Ref Range   Cholesterol, Total 261 (H) 100 - 199 mg/dL   Triglycerides 846 (H) 0 - 149 mg/dL   HDL 60 >60 mg/dL   VLDL Cholesterol Cal 28 5 - 40 mg/dL   LDL 826 (H) 0 - 99 mg/dL   LDL/HDL Ratio 2.9 0.0 - 3.2 ratio  Comprehensive Metabolic Panel   Collection Time: 06/27/23  8:08 AM  Result Value Ref Range   Glucose 104 (H) 70 - 99 mg/dL  BUN 14 8 - 27 mg/dL   Creatinine 9.27 9.42 - 1.00 mg/dL   eGFR 90 >40 fO/fpw/8.26   BUN/Creatinine Ratio 19 12 - 28   Sodium 140 134 - 144 mmol/L   Potassium 4.2 3.5 - 5.2 mmol/L   Chloride 102 96 - 106 mmol/L   CO2 25 20 - 29 mmol/L   CALCIUM  8.9 8.7 - 10.3 mg/dL   Total Protein 6.6 6.0 - 8.5 g/dL   Albumin, Serum 4.0 3.9 - 4.9 g/dL   Globulin, Total 2.6 1.5 - 4.5 g/dL   Total Bilirubin 0.2 0.0 - 1.2 mg/dL   Alkaline Phosphatase 63 44 - 121 IU/L   AST 25 0 - 40 IU/L   ALT (SGPT) 27 0 - 32 IU/L   Documentation for time-based billing:  Total time spent of date of service was 60 minutes.  Patient care activities included preparing to see the patient such as reviewing the patient record, obtaining and/or reviewing separately obtained history, performing a medically appropriate history and physical examination, counseling and educating the patient, family, and/or caregiver, ordering prescription medications, tests, or procedures, referring and communicating with other health care providers when not separately reported during the visit, documenting clinical information in the electronic or other health record, independently interpreting results when not separately reported, communicating results to the patient/family/caregiver, and coordinating the care of the patient when not separately reported.     Bernita CANDIE Ned, PA-C

## 2023-06-30 NOTE — Telephone Encounter (Signed)
 Last Prolia  inj 06/26/23 Next Prolia  inj due 12/28/23

## 2023-07-16 NOTE — Progress Notes (Signed)
 This patient's chart has been reviewed by a [facility] Specialist.   Attempted to contact patient in order to discuss appointments and health maintenance screenings due for the upcoming year.   Health Maintenance Due  Topic Date Due  . Mammogram  10/08/2022  . Medicare Annual Wellness  08/09/2023     Left message with [facility] Specialist's contact information. and [employer] message with health maintenance recommendations and [facility] Specialist's contact information..  Additional Comments: n/a [facility]
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
## 2023-08-20 ENCOUNTER — Ambulatory Visit (HOSPITAL_COMMUNITY)
Admission: EM | Admit: 2023-08-20 | Discharge: 2023-08-20 | Disposition: A | Discharge status: Other Acute Inpatient Hospital | Attending: Nurse Practitioner | Admitting: Nurse Practitioner

## 2023-08-20 ENCOUNTER — Inpatient Hospital Stay
Admission: AD | Admit: 2023-08-20 | Discharge: 2023-08-30 | DRG: 882 | Disposition: A | Discharge status: Home / Self Care | Source: Intra-hospital | Attending: Psychiatry | Admitting: Psychiatry

## 2023-08-20 DIAGNOSIS — F429 Obsessive-compulsive disorder, unspecified: Secondary | ICD-10-CM | POA: Insufficient documentation

## 2023-08-20 DIAGNOSIS — F40240 Claustrophobia: Secondary | ICD-10-CM | POA: Insufficient documentation

## 2023-08-20 DIAGNOSIS — E785 Hyperlipidemia, unspecified: Secondary | ICD-10-CM | POA: Diagnosis present

## 2023-08-20 DIAGNOSIS — F4000 Agoraphobia, unspecified: Secondary | ICD-10-CM | POA: Diagnosis present

## 2023-08-20 DIAGNOSIS — Z833 Family history of diabetes mellitus: Secondary | ICD-10-CM

## 2023-08-20 DIAGNOSIS — Z8659 Personal history of other mental and behavioral disorders: Secondary | ICD-10-CM | POA: Diagnosis not present

## 2023-08-20 DIAGNOSIS — Z8249 Family history of ischemic heart disease and other diseases of the circulatory system: Secondary | ICD-10-CM

## 2023-08-20 DIAGNOSIS — F603 Borderline personality disorder: Secondary | ICD-10-CM | POA: Diagnosis present

## 2023-08-20 DIAGNOSIS — Z6824 Body mass index (BMI) 24.0-24.9, adult: Secondary | ICD-10-CM | POA: Diagnosis not present

## 2023-08-20 DIAGNOSIS — F509 Eating disorder, unspecified: Secondary | ICD-10-CM | POA: Diagnosis present

## 2023-08-20 DIAGNOSIS — F32A Depression, unspecified: Secondary | ICD-10-CM | POA: Diagnosis present

## 2023-08-20 DIAGNOSIS — R5383 Other fatigue: Secondary | ICD-10-CM | POA: Diagnosis not present

## 2023-08-20 DIAGNOSIS — Z888 Allergy status to other drugs, medicaments and biological substances status: Secondary | ICD-10-CM

## 2023-08-20 DIAGNOSIS — Z818 Family history of other mental and behavioral disorders: Secondary | ICD-10-CM

## 2023-08-20 DIAGNOSIS — F481 Depersonalization-derealization syndrome: Secondary | ICD-10-CM | POA: Diagnosis present

## 2023-08-20 DIAGNOSIS — M810 Age-related osteoporosis without current pathological fracture: Secondary | ICD-10-CM | POA: Diagnosis present

## 2023-08-20 DIAGNOSIS — Z8262 Family history of osteoporosis: Secondary | ICD-10-CM | POA: Diagnosis not present

## 2023-08-20 DIAGNOSIS — Z800 Family history of malignant neoplasm of digestive organs: Secondary | ICD-10-CM

## 2023-08-20 DIAGNOSIS — Z79899 Other long term (current) drug therapy: Secondary | ICD-10-CM

## 2023-08-20 DIAGNOSIS — G4700 Insomnia, unspecified: Secondary | ICD-10-CM | POA: Diagnosis not present

## 2023-08-20 DIAGNOSIS — G43109 Migraine with aura, not intractable, without status migrainosus: Secondary | ICD-10-CM | POA: Diagnosis present

## 2023-08-20 DIAGNOSIS — G20A1 Parkinson's disease without dyskinesia, without mention of fluctuations: Secondary | ICD-10-CM | POA: Diagnosis present

## 2023-08-20 DIAGNOSIS — F410 Panic disorder [episodic paroxysmal anxiety] without agoraphobia: Secondary | ICD-10-CM | POA: Diagnosis present

## 2023-08-20 DIAGNOSIS — F422 Mixed obsessional thoughts and acts: Secondary | ICD-10-CM | POA: Diagnosis not present

## 2023-08-20 LAB — LIPID PANEL
Cholesterol: 152 mg/dL (ref 0–200)
HDL: 68 mg/dL (ref >40)
LDL Cholesterol: 69 mg/dL (ref 0–99)
Total CHOL/HDL Ratio: 2.2 ratio
Triglycerides: 76 mg/dL (ref <150)
VLDL: 15 mg/dL (ref 0–40)

## 2023-08-20 LAB — CBC WITH DIFFERENTIAL/PLATELET
Abs Immature Granulocytes: 0.01 K/uL (ref 0.00–0.07)
Basophils Absolute: 0 K/uL (ref 0.0–0.1)
Basophils Relative: 1 %
Eosinophils Absolute: 0 K/uL (ref 0.0–0.5)
Eosinophils Relative: 1 %
HCT: 42.5 % (ref 36.0–46.0)
Hemoglobin: 14.7 g/dL (ref 12.0–15.0)
Immature Granulocytes: 0 %
Lymphocytes Relative: 39 %
Lymphs Abs: 2.2 K/uL (ref 0.7–4.0)
MCH: 29.8 pg (ref 26.0–34.0)
MCHC: 34.6 g/dL (ref 30.0–36.0)
MCV: 86.2 fL (ref 80.0–100.0)
Monocytes Absolute: 0.4 K/uL (ref 0.1–1.0)
Monocytes Relative: 7 %
Neutro Abs: 2.9 K/uL (ref 1.7–7.7)
Neutrophils Relative %: 52 %
Platelets: 216 K/uL (ref 150–400)
RBC: 4.93 MIL/uL (ref 3.87–5.11)
RDW: 12.5 % (ref 11.5–15.5)
WBC: 5.6 K/uL (ref 4.0–10.5)
nRBC: 0 % (ref 0.0–0.2)

## 2023-08-20 LAB — COMPREHENSIVE METABOLIC PANEL WITH GFR
ALT: 33 U/L (ref 0–44)
AST: <5 U/L — ABNORMAL LOW (ref 15–41)
Albumin: 4.6 g/dL (ref 3.5–5.0)
Alkaline Phosphatase: 48 U/L (ref 38–126)
Anion gap: 10 (ref 5–15)
BUN: 22 mg/dL (ref 8–23)
CO2: 29 mmol/L (ref 22–32)
Calcium: 10.1 mg/dL (ref 8.9–10.3)
Chloride: 103 mmol/L (ref 98–111)
Creatinine, Ser: 0.73 mg/dL (ref 0.44–1.00)
GFR, Estimated: >60 mL/min (ref >60)
Glucose, Bld: 102 mg/dL — ABNORMAL HIGH (ref 70–99)
Potassium: 3.8 mmol/L (ref 3.5–5.1)
Sodium: 142 mmol/L (ref 135–145)
Total Bilirubin: 0.5 mg/dL (ref 0.0–1.2)
Total Protein: 7.9 g/dL (ref 6.5–8.1)

## 2023-08-20 LAB — POCT URINE DRUG SCREEN - MANUAL ENTRY (I-SCREEN)
POC Amphetamine UR: NOT DETECTED
POC Buprenorphine (BUP): NOT DETECTED
POC Cocaine UR: NOT DETECTED
POC Marijuana UR: NOT DETECTED
POC Methadone UR: NOT DETECTED
POC Methamphetamine UR: NOT DETECTED
POC Morphine: NOT DETECTED
POC Oxazepam (BZO): POSITIVE — AB
POC Oxycodone UR: NOT DETECTED
POC Secobarbital (BAR): NOT DETECTED

## 2023-08-20 LAB — HEMOGLOBIN A1C
Hgb A1c MFr Bld: 4.9 % (ref 4.8–5.6)
Mean Plasma Glucose: 93.93 mg/dL

## 2023-08-20 LAB — URINALYSIS, ROUTINE W REFLEX MICROSCOPIC
Bilirubin Urine: NEGATIVE
Glucose, UA: NEGATIVE mg/dL
Hgb urine dipstick: NEGATIVE
Ketones, ur: 20 mg/dL — AB
Leukocytes,Ua: NEGATIVE
Nitrite: NEGATIVE
Protein, ur: 30 mg/dL — AB
Specific Gravity, Urine: 1.034 — ABNORMAL HIGH (ref 1.005–1.030)
pH: 5 (ref 5.0–8.0)

## 2023-08-20 LAB — ETHANOL: Alcohol, Ethyl (B): <15 mg/dL (ref <15)

## 2023-08-20 LAB — TSH: TSH: 0.912 u[IU]/mL (ref 0.350–4.500)

## 2023-08-20 LAB — SARS CORONAVIRUS 2 BY RT PCR: SARS Coronavirus 2 by RT PCR: NEGATIVE

## 2023-08-20 MED ORDER — ACETAMINOPHEN 325 MG PO TABS: 650.0000 mg | ORAL_TABLET | Freq: Four times a day (QID) | ORAL | Status: DC | PRN

## 2023-08-20 MED ORDER — OLANZAPINE 5 MG PO TABS
5.0000 mg | ORAL_TABLET | Freq: Once | ORAL | Status: DC
  Filled 2023-08-20: qty 1

## 2023-08-20 MED ORDER — OLANZAPINE 5 MG PO TBDP: 5.0000 mg | ORAL_TABLET | Freq: Three times a day (TID) | ORAL | Status: DC | PRN

## 2023-08-20 MED ORDER — PANTOPRAZOLE SODIUM 40 MG PO TBEC
40.0000 mg | DELAYED_RELEASE_TABLET | Freq: Every day | ORAL | Status: DC
  Administered 2023-08-21 – 2023-08-30 (×10): 40 mg via ORAL
  Filled 2023-08-20 (×10): qty 1

## 2023-08-20 MED ORDER — PANTOPRAZOLE SODIUM 40 MG PO TBEC: 40.0000 mg | DELAYED_RELEASE_TABLET | Freq: Every day | ORAL | Status: DC

## 2023-08-20 MED ORDER — VITAMIN D (ERGOCALCIFEROL) 1.25 MG (50000 UNIT) PO CAPS: 50000.0000 [IU] | ORAL_CAPSULE | ORAL | Status: DC

## 2023-08-20 MED ORDER — ALUM & MAG HYDROXIDE-SIMETH 200-200-20 MG/5ML PO SUSP: 30.0000 mL | ORAL | Status: DC | PRN

## 2023-08-20 MED ORDER — QUETIAPINE FUMARATE 25 MG PO TABS
25.0000 mg | ORAL_TABLET | Freq: Once | ORAL | Status: AC
  Administered 2023-08-20: 25 mg via ORAL
  Filled 2023-08-20: qty 1

## 2023-08-20 MED ORDER — POLYETHYLENE GLYCOL 3350 17 G PO PACK
17.0000 g | PACK | Freq: Every day | ORAL | Status: DC | PRN
Start: 2023-08-20 — End: 2023-08-20
  Administered 2023-08-20: 17 g via ORAL
  Filled 2023-08-20: qty 1

## 2023-08-20 MED ORDER — VITAMIN D (ERGOCALCIFEROL) 1.25 MG (50000 UNIT) PO CAPS
50000.0000 [IU] | ORAL_CAPSULE | ORAL | Status: DC
  Administered 2023-08-21 – 2023-08-28 (×2): 50000 [IU] via ORAL
  Filled 2023-08-20 (×3): qty 1

## 2023-08-20 MED ORDER — POLYETHYLENE GLYCOL 3350 17 G PO PACK
17.0000 g | PACK | Freq: Every day | ORAL | Status: DC | PRN
  Administered 2023-08-24 – 2023-08-28 (×5): 17 g via ORAL
  Filled 2023-08-20 (×5): qty 1

## 2023-08-20 MED ORDER — MAGNESIUM HYDROXIDE 400 MG/5ML PO SUSP: 30.0000 mL | Freq: Every day | ORAL | Status: DC | PRN

## 2023-08-20 MED ORDER — ALUM & MAG HYDROXIDE-SIMETH 200-200-20 MG/5ML PO SUSP
30.0000 mL | ORAL | Status: DC | PRN
  Administered 2023-08-28 (×2): 30 mL via ORAL
  Filled 2023-08-20 (×3): qty 30

## 2023-08-20 MED ORDER — QUETIAPINE FUMARATE 25 MG PO TABS
25.0000 mg | ORAL_TABLET | Freq: Once | ORAL | Status: DC
  Filled 2023-08-20: qty 1

## 2023-08-20 NOTE — Discharge Instructions (Addendum)
 Please follow up with your outpatient psychiatrist with [facility].  Get help right away if: You have thoughts about hurting yourself or others. Get help right away if you feel like you may hurt yourself or others, or have thoughts about taking your own life. Go to your nearest emergency room or: Call 911. Call the National Suicide Prevention Lifeline at 413-001-9082 or 988 in the [place] This is open 24 hours a day. If you're a Veteran: Call 988 and press 1. This is open 24 hours a day. Text the [employer] at 551-865-9970. Summary Mental health is not just the absence of mental illness. It involves understanding your emotions and behaviors, and taking steps to manage them in a healthy way. If you have symptoms of mental or emotional distress, get help from family, friends, a health care provider, or a mental health professional. Practice good mental health behaviors such as stress management skills, self-calming skills, exercise, healthy sleeping and eating, and supportive relationships. This information is not intended to replace advice given to you by your health care provider. Make sure you discuss any questions you have with your health care provider.    Education provided on the fact that if experiencing worsening of psychiatry symptoms including suicidal ideations, homicidal ideations, or having auditory/visual hallucinations, etc, to call 911, 988, come back to this location, or go to the nearest ER. Pt verbalized understanding.

## 2023-08-20 NOTE — ED Notes (Signed)
 Patient discharged to [facility] in no acute distress. Belongings returned and given to [facility]. Safety maintained.
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
## 2023-08-20 NOTE — ED Notes (Addendum)
 Patient is alert & oriented X 4. She denies SI/HI or AVH. Patient endorses anxiety. She denies physical pain or discomfort. Pt states she began to not recognize her home. States she would look at the items in her home and they seemed unfamiliar. Skin check conducted by this nurse and Dwayne, MHT. Pt placed in scrubs,  slip resistance socks, and her tennis shoes without strings. Pt oriented to the unit and provided lunch and a beverage. During our assessment, pt stated that due to her history of bulimia and anorexia, she will need medication to aid in a bowel moment. Patient has not had a bowel movement in a while, but less than a week, and is not passing gas. States she feels bloated. Will make provider aware.

## 2023-08-20 NOTE — ED Provider Notes (Signed)
 [facility] Continuous Assessment Admission H&P  Date: 08/20/23 Patient Name: [name] MRN: [id number] Chief Complaint: worsening intrusive thoughts.  Diagnoses:  Final diagnoses:  Obsessive-compulsive disorder, unspecified type   HPI: Per triage:  [name] 68 year old female accompanied by [name]) reports for the past 10 days experience anxiety and desocialization, I just don't feel like I am home. History of dx anxiety, eating disorder, and OCD. Report her psychiatrist Dr. Elouise Rous with [facility] thinks it's her OCD thoughts. Therapist Warren Delaine and eating disorder dietitian. Report when wakes up with sweats and does not know where she it. Denied SI/HI and psychosis. Report decrease appetite. Scheduled to have a sleep study in a couple weeks. Primary Care Bernita Purchase with [facility]. Report her home does not feel like home. Report went to sleep, woke up and nothing feels the same anymore, my home does not feel like home. Patient reports she wants to go back to feeling normal. Patient stated she does not feel connected to anything. Report feeling Claustrophobia in her home.  Patient assessment: During writer's encounter with patient, mood is depressed, & is extremely anxious, reports being unsure if I can go on and live like this. She states that this presentation has been ongoing for the past month, states that she is not sure if this is related to medication changes recently completed by her Primary care provider. States that Klonopin  was  abruptly stopped prior to the onset of her symptoms, shares that she was on 0.5 mg x 4-5 yr, then stopped abruptly at the recommendation of her Psychiatrist, prior to her PCP restarting this medication and currently in process of gradual titrations with a goal of completely stopping it. She shares that she is currently on 0.5 mg daily x 7 days, then will have to take 0.5 mg every other day for  another 7 days, then stop it entirely, and titration will be completed. Pt shares that she was also on Paxil for 4-5 years and it was also stopped a month ago, and Zoloft  was started. She reports brain zaps with the cessation of the Paxil, but states that the brain zaps have resolved since she began taking the Zoloft . She is not sure of the strength of her medications, but as per chart review, she was on 30 mg daily.   Pt reports not feeling like my apartment is my own anymore, reports not feeling at home, & having recurrent episodes of dissociation. She reports being afraid to go to sleep, and being afraid to wake up, because she does not like to feel like she has vivid dreams when she sleeps, and feels electric and pins and needles when she wakes up.   Reports worsening intrusive thoughts that I will never get better.  Denies any other OCD type symptoms currently. Reports that all of a sudden, her life does not feel like her own, her home does not feel like her own, she can't stop worrying.  Reports depressive symptoms which have worsened with the worsening intrusive though, even though she denies being depressed; reports worsening insomnia, anhedonia, poor concentration levels, mental clouding, decreased energy levels, feelings of helplessness & hopelessness. Worsening intrusive thoughts that something bad is about to happen to her. Is scared of her apartment. Does not feel safe there anymore. Reports that she has resided there for 13 yrs, but the intrusive thoughts are rendering her not to enjoy living there any more. States that she does, not have  family in this area, has one daughter who resides in [place] , but with whom she does not communicate. Reports that she has one friend who accompanied her here today, states that friend is her emergency contact person [name] [phone]). 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  Reports  that her Psychiatrist has stated that these symptoms are as a result of her OCD, has recommended a treatment facility for OCD, but all of them are out of state, and she has decided not to go to any of them.   Denies substance use of any type, denies alcohol use. Denies having any medical conditions. Patient presents Pt with flat affect and depressed mood, attention to personal hygiene and grooming is fair, eye contact is good, speech is clear & coherent. Thought contents are organized and logical, and pt currently denies HI/AVH. There are no overt signs of psychosis. Patient is ambulatory, does not use any assistive devices, and is able to complete all ADLs by herself.  Recommendation: Inpatient admission to a Geropsychiatry unit for treatment and stabilization of her mental status:  Pt has been educated that in the event that they change their mind, & want to be discharged, they would be required to provide a 72 hr notice for discharge, and it would be at the discretion of their treatment team to determine if they meet criteria for discharge/involuntary commitment and pursue either option if deemed necessary. Pt verbalizes understanding. This was discussed with her because she did not want hospitalization, then changed her mind, and was agreeable to being hospitalized.  Suicide Risk Assessment  SUICIDE RISK:  Moderate:  Patient presents with passive suicidal ideations, there are no plans prior to hospitalization, no subjective intent, but pt presents with severe dysphoria/symptomatology, multiple risk factors present, and few if any protective factors, particularly a lack of social support. She resides by herself, is older, has no available family support.  Total Time spent with patient: 1.5 hours  Musculoskeletal  Strength & Muscle Tone: within normal limits Gait & Station: normal Patient leans: N/A  Psychiatric Specialty Exam  Presentation General Appearance: Fairly Groomed  Eye  Contact:Fair  Speech:Clear and Coherent  Speech Volume:Normal  Handedness:Right  Mood and Affect  Mood:Depressed; Anxious  Affect:Congruent   Thought Process  Thought Processes:Coherent  Descriptions of Associations:Intact  Orientation:Full (Time, Place and Person)  Thought Content:Logical    Hallucinations:Hallucinations: None  Ideas of Reference:None  Suicidal Thoughts:Suicidal Thoughts: Yes, Passive SI Passive Intent and/or Plan: Without Intent; Without Plan  Homicidal Thoughts:Homicidal Thoughts: No  Sensorium  Memory:Immediate Fair  Judgment:Fair  Insight:Fair  Executive Functions  Concentration:Fair  Attention Span:Fair  Recall:Fair  Fund of Knowledge:Fair  Language:Good  Psychomotor Activity  Psychomotor Activity:Psychomotor Activity: Normal  Assets  Assets:Resilience  Sleep  Sleep:Sleep: Fair  No data recorded  Physical Exam Constitutional:      Appearance: Normal appearance.  Eyes:     Pupils: Pupils are equal, round, and reactive to light.  Musculoskeletal:        General: Normal range of motion.     Cervical back: Normal range of motion.  Neurological:     General: No focal deficit present.     Mental Status: She is alert and oriented to person, place, and time.    Review of Systems  Psychiatric/Behavioral:  Positive for depression. Negative for hallucinations, memory loss, substance abuse and suicidal ideas. The patient is nervous/anxious and has insomnia.     Blood pressure (!) 115/59, pulse  68, resp. rate 17, last menstrual period 02/18/2004, SpO2 92%. There is no height or weight on file to calculate BMI.  Is the patient at risk to self? No  Has the patient been a risk to self in the past 6 months? No .    Has the patient been a risk to self within the distant past? No   Is the patient a risk to others? No   Has the patient been a risk to others in the past 6 months? No   Has the patient been a risk to others within  the distant past? No   Past Medical History: denies   Family History: not provided  Social History: lives by her self, has one daughter but is not in touch with her.  Last Labs:  Admission on 08/20/2023  Component Date Value Ref Range Status   POC Amphetamine UR 08/20/2023 None Detected  NONE DETECTED (Cut Off Level 1000 ng/mL) Final   POC Secobarbital (BAR) 08/20/2023 None Detected  NONE DETECTED (Cut Off Level 300 ng/mL) Final   POC Buprenorphine (BUP) 08/20/2023 None Detected  NONE DETECTED (Cut Off Level 10 ng/mL) Final   POC Oxazepam (BZO) 08/20/2023 Positive (A)  NONE DETECTED (Cut Off Level 300 ng/mL) Final   POC Cocaine UR 08/20/2023 None Detected  NONE DETECTED (Cut Off Level 300 ng/mL) Final   POC Methamphetamine UR 08/20/2023 None Detected  NONE DETECTED (Cut Off Level 1000 ng/mL) Final   POC Morphine 08/20/2023 None Detected  NONE DETECTED (Cut Off Level 300 ng/mL) Final   POC Methadone UR 08/20/2023 None Detected  NONE DETECTED (Cut Off Level 300 ng/mL) Final   POC Oxycodone UR 08/20/2023 None Detected  NONE DETECTED (Cut Off Level 100 ng/mL) Final   POC Marijuana UR 08/20/2023 None Detected  NONE DETECTED (Cut Off Level 50 ng/mL) Final  Office Visit on 05/11/2023  Component Date Value Ref Range Status   Albumin 05/11/2023 4.4  3.6 - 5.1 g/dL Final   PTH 96/71/7974 117 (H)  16 - 77 pg/mL Final   Comment: . Interpretive Guide    Intact PTH           Calcium  ------------------    ----------           ------- Normal Parathyroid     Normal               Normal Hypoparathyroidism    Low or Low Normal    Low Hyperparathyroidism    Primary            Normal or High       High    Secondary          High                 Normal or Low    Tertiary           High                 High Non-Parathyroid     Hypercalcemia      Low or Low Normal    High .    Vit D, 25-Hydroxy 05/11/2023 33  30 - 100 ng/mL Final   Comment: Vitamin D  Status         25-OH Vitamin D : . Deficiency:                     <20 ng/mL Insufficiency:  20 - 29 ng/mL Optimal:                 > or = 30 ng/mL . For 25-OH Vitamin D  testing on patients on  D2-supplementation and patients for whom quantitation  of D2 and D3 fractions is required, the [employer](TM) 25-OH VIT D, (D2,D3), LC/MS/MS is recommended: order  code 07111 (patients >64yrs). . See Note 1 . Note 1 . For additional information, please refer to  http://education.[facility].com/faq/FAQ199  (This link is being provided for informational/ educational purposes only.)    Glucose, Bld 05/11/2023 85  65 - 99 mg/dL Final   Comment: .            Fasting reference interval .    BUN 05/11/2023 18  7 - 25 mg/dL Final   Creat 96/71/7974 0.67  0.50 - 1.05 mg/dL Final   eGFR 96/71/7974 95  > OR = 60 mL/min/1.76m2 Final   BUN/Creatinine Ratio 05/11/2023 SEE NOTE:  6 - 22 (calc) Final   Comment:    Not Reported: BUN and Creatinine are within    reference range. .    Sodium 05/11/2023 137  135 - 146 mmol/L Final   Potassium 05/11/2023 4.9  3.5 - 5.3 mmol/L Final   Chloride 05/11/2023 99  98 - 110 mmol/L Final   CO2 05/11/2023 33 (H)  20 - 32 mmol/L Final   Calcium  05/11/2023 9.4  8.6 - 10.4 mg/dL Final    Allergies: Carbamazepine, Prochlorperazine edisylate, Keflex [cephalexin], Augmentin [amoxicillin-pot clavulanate], Prochlorperazine, and Epinephrine   Medications:  Facility Ordered Medications  Medication   denosumab  (PROLIA ) injection 60 mg   [START ON 12/23/2023] denosumab  (PROLIA ) injection 60 mg   acetaminophen  (TYLENOL ) tablet 650 mg   alum & mag hydroxide-simeth (MAALOX/MYLANTA) 200-200-20 MG/5ML suspension 30 mL   magnesium  hydroxide (MILK OF MAGNESIA) suspension 30 mL   OLANZapine  zydis (ZYPREXA ) disintegrating tablet 5 mg   OLANZapine  (ZYPREXA ) tablet 5 mg   PTA Medications  Medication Sig   polyethylene glycol (MIRALAX  / GLYCOLAX ) 17 g packet as needed.   sucralfate  (CARAFATE ) 1 GM/10ML suspension  Take by mouth.   pantoprazole  (PROTONIX ) 40 MG tablet Take 1 tablet (40 mg total) by mouth 2 (two) times daily.   clonazePAM  (KLONOPIN ) 0.5 MG tablet Take 0.5 mg by mouth daily.   Calcium  200 MG TABS    ondansetron  (ZOFRAN -ODT) 4 MG disintegrating tablet Take 4 mg by mouth every 8 (eight) hours as needed.   rosuvastatin  (CRESTOR ) 20 MG tablet Take by mouth.   pantoprazole  (PROTONIX ) 40 MG tablet Take by mouth.   Vitamin D , Ergocalciferol , (DRISDOL ) 1.25 MG (50000 UNIT) CAPS capsule Take by mouth.   famotidine  (PEPCID ) 20 MG tablet Take 20 mg by mouth 2 (two) times daily.   PARoxetine (PAXIL) 30 MG tablet Take 30 mg by mouth daily.   Plecanatide 3 MG TABS Take by mouth.   QUEtiapine  (SEROQUEL ) 25 MG tablet Take by mouth. (Patient not taking: Reported on 05/11/2023)   Medical Decision Making  -Referred for inpatient hospitalization -Baseline labs ordered including TSH, hemoglobin A1c, lipid panel, EKG -Ordered 1 time dose of Zyprexa  5 mg for extreme anxiety -Ordered Klonopin  0.5 mg daily -Adult agitation protocol medications: Zyprexa  5 mg p.o. or IM 3 times daily as needed for agitation -Educated patient on reasonable expectations upon inpatient unit, including the fact that she is to provide a 72-hour notice for discharge in the event that she changes her mind and no longer wants to stay inpatient.  Recommendations  Based on my evaluation the patient appears to have an emergency mental health condition for which I recommend the patient be transferred to an inpatient behavioral health unit for treatment and stabilization.  Donia Snell, NP 08/20/23  4:13 PM

## 2023-08-20 NOTE — Progress Notes (Signed)
 Pt was accepted to [facility] 08/20/2023 Bed Assignment 34  Address: [address] [place], [place], [place] [address]  [facility] Fax: [phone]  Pt meets inpatient criteria per: Donia Snell NP  Attending Physician will be Dr. Allyn Donnelly COME  Report can be called to: [phone]  Pt can arrive after: pending labs, vol, UDS, EKG, and covid   Care Team notified: Cherylynn Ernst RN, Donia Snell NP, Devere Mulch RN, [facility] LPN      [place] Lanie Schelling, MSW, [facility] 08/20/2023 3:38 PM

## 2023-08-21 ENCOUNTER — Other Ambulatory Visit: Payer: Self-pay

## 2023-08-21 ENCOUNTER — Encounter: Payer: Self-pay | Admitting: Psychiatry

## 2023-08-21 DIAGNOSIS — F429 Obsessive-compulsive disorder, unspecified: Secondary | ICD-10-CM | POA: Diagnosis not present

## 2023-08-21 MED ORDER — CLONAZEPAM 0.5 MG PO TABS
0.5000 mg | ORAL_TABLET | Freq: Every day | ORAL | Status: DC | PRN
  Administered 2023-08-21 – 2023-08-22 (×2): 0.5 mg via ORAL
  Filled 2023-08-21 (×2): qty 1

## 2023-08-21 MED ORDER — SERTRALINE HCL 50 MG PO TABS
50.0000 mg | ORAL_TABLET | Freq: Every day | ORAL | Status: DC
  Administered 2023-08-21 – 2023-08-22 (×2): 50 mg via ORAL
  Filled 2023-08-21 (×2): qty 1

## 2023-08-21 MED ORDER — QUETIAPINE FUMARATE 25 MG PO TABS
50.0000 mg | ORAL_TABLET | Freq: Every day | ORAL | Status: DC
  Administered 2023-08-21 – 2023-08-24 (×4): 50 mg via ORAL
  Filled 2023-08-21 (×4): qty 2

## 2023-08-21 MED ORDER — HYDROXYZINE HCL 25 MG PO TABS: 25.0000 mg | ORAL_TABLET | Freq: Four times a day (QID) | ORAL | Status: DC | PRN

## 2023-08-21 NOTE — H&P (Signed)
 Psychiatric Admission Assessment Adult  Patient Identification: [name] MRN:  [id number] Date of Evaluation:  08/21/2023 Chief Complaint:  OCD (obsessive compulsive disorder) [F42.9]  History of Present Illness:  [name] 68 year old female accompanied by her friend [name]) reports for the past 10 days experience anxiety and desocialization, I just don't feel like I am home. History of dx anxiety, eating disorder, and OCD. Report her psychiatrist Dr. Elouise Rous with [facility] thinks it's her OCD thoughts. Therapist Warren Delaine and eating disorder dietitian. Report when wakes up with sweats and does not know where she it. During writer's encounter with patient, mood is depressed, & is extremely anxious, reports being unsure if I can go on and live like this . Patient is admitted to [facility] unit with Q15 min safety monitoring. Multidisciplinary team approach is offered. Medication management; group/milieu therapy is offered.   Patient reports that she has been having worsening anxiety and panic attacks episodes where she woke up and despite following her home being unreal.  She is unable to leave describe and differentiate if it is derealization or depersonalization.  She reports that it started out feeling that way for some time in the morning and then reports that most of the day she felt everything around her as not being real.  She talks about multiple losses in family but denies any history of abuse.  She reports that she was diagnosed with OCD by her outpatient psychiatrist stating her anxiety about being anxious is very intrusive and disabling.  Patient reports that she was on Klonopin  for 4 years, Paxil for some years and recently they made a plan to taper down Klonopin  and stopped Paxil a month ago-switched her to Zoloft .  Patient reports having mild zapping episodes when she stopped Paxil but since she was started on Zoloft  she has been feeling a lot better about  that.  Patient reports that she was abruptly stopped the Klonopin  and wants she started feeling terrible she was put back on a taper of Klonopin  1 mg for 7 days followed by 0.5 mg for 7 days and then stop.  Patient reports that she is unable to manage without Klonopin .  Throughout the interview she is noted to be anxious and constantly reports that she needs some medicine ASAP to feel better.  She is unable to focus on the groups.  She denies auditory/visual hallucinations.  She denies having any anxiety during social situations or any agoraphobia.  She denies suicidal/homicidal ideation/plan.  She reports feeling depressed about her worsening anxiety and feelings reportedly hopeless about her situation.  She reports having fair sleep but poor appetite and some weight loss.  She also endorses anhedonia due to extreme anxiety with low energy and motivation.  She reports waking up at nights multiple times with sweats but denies having any nightmares.  Total Time spent with patient: 1 hour Sleep  Sleep:Sleep: Fair  Past Psychiatric History:  Psychiatric History:  Information collected from patient/chart  Prev Dx/Sx: Depression, anxiety, OCD Current Psych Provider: Dr. 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 No.  Has the patient been a risk to self in the past 6 months? No.  Has the patient been a risk to self within the distant past? No.  Is the patient a risk to others? No.  Has the patient been a risk to others in the past 6 months? No.  Has the patient been a risk to others within the distant past? No.   [place] Scale:  Flowsheet Row Admission (Current) from 08/20/2023 in [facility] Most recent reading at 08/20/2023 10:00 PM ED from 08/20/2023 in [facility] Most recent reading at 08/20/2023  4:32 PM ED from 02/15/2022 in [facility] Emergency Department at [facility] Most recent reading at 02/15/2022  3:50 PM  C-SSRS RISK CATEGORY No Risk No Risk No Risk     Past Medical History:  Past Medical History:  Diagnosis Date   Acute pain of right knee 03/07/2017   Anemia    Anorexia    Anxiety    Bitemporal hemianopsia    Borderline personality disorder (HCC)    Bulimia    Depression    Hyperlipidemia    Ocular migraine    Osteoporosis    Pigmentary retinal dystrophy    Retinitis pigmentosa    TBI (traumatic brain injury) (HCC)     Past Surgical History:  Procedure Laterality Date   CESAREAN SECTION     Family History:  Family History  Problem Relation Age of Onset   Polymyalgia rheumatica Mother    Osteoporosis Mother    Cancer Father    Heart disease Father        CHF   Diabetes Sister        Obesity   Stomach cancer Maternal Grandfather    Colon cancer Neg Hx    Esophageal cancer Neg Hx    Rectal cancer Neg Hx     Social History:  Social History   Substance and Sexual Activity  Alcohol Use No     Social History   Substance and Sexual Activity  Drug Use No      Allergies:   Allergies  Allergen Reactions   Carbamazepine Palpitations   Prochlorperazine Edisylate     Muscle tremors Other reaction(s): Other   Keflex [Cephalexin] Rash and Other (See Comments)    Other: thrush in mouth   Augmentin  [Amoxicillin-Pot Clavulanate] Diarrhea   Prochlorperazine Other (See Comments)   Epinephrine  Palpitations and Other (See Comments)   Lab Results:  Results for orders placed or performed during the hospital encounter of 08/20/23 (from the past 48 hours)  SARS Coronavirus 2 by RT PCR (hospital order, performed in [facility] hospital lab) *cepheid single result test* Anterior Nasal Swab     Status: None   Collection Time: 08/20/23  3:25 PM   Specimen: Anterior Nasal Swab  Result Value Ref Range   SARS Coronavirus 2 by RT PCR NEGATIVE NEGATIVE    Comment: Performed at [facility], [address] [place], [place] [address]  CBC with Differential/Platelet     Status: None   Collection Time: 08/20/23  3:25 PM  Result Value Ref Range   WBC 5.6 4.0 - 10.5 K/uL   RBC 4.93 3.87 - 5.11 MIL/uL   Hemoglobin 14.7 12.0 - 15.0 g/dL  HCT 42.5 36.0 - 46.0 %   MCV 86.2 80.0 - 100.0 fL   MCH 29.8 26.0 - 34.0 pg   MCHC 34.6 30.0 - 36.0 g/dL   RDW 87.4 88.4 - 84.4 %   Platelets 216 150 - 400 K/uL   nRBC 0.0 0.0 - 0.2 %   Neutrophils Relative % 52 %   Neutro Abs 2.9 1.7 - 7.7 K/uL   Lymphocytes Relative 39 %   Lymphs Abs 2.2 0.7 - 4.0 K/uL   Monocytes Relative 7 %   Monocytes Absolute 0.4 0.1 - 1.0 K/uL   Eosinophils Relative 1 %   Eosinophils Absolute 0.0 0.0 - 0.5 K/uL   Basophils Relative 1 %   Basophils Absolute 0.0 0.0 - 0.1 K/uL   Immature Granulocytes 0 %   Abs Immature Granulocytes 0.01 0.00 - 0.07 K/uL    Comment: Performed at [facility], [address] [place], [place] [address]  Comprehensive metabolic panel     Status: Abnormal   Collection Time: 08/20/23  3:25 PM  Result Value Ref Range   Sodium 142 135 - 145 mmol/L   Potassium 3.8 3.5 - 5.1 mmol/L   Chloride 103 98 - 111 mmol/L   CO2 29 22 - 32 mmol/L   Glucose, Bld 102 (H) 70 - 99 mg/dL    Comment: Glucose reference range applies only to samples taken after fasting for at least 8 hours.   BUN 22 8 - 23 mg/dL    Creatinine, Ser 9.26 0.44 - 1.00 mg/dL   Calcium  10.1 8.9 - 10.3 mg/dL   Total Protein 7.9 6.5 - 8.1 g/dL   Albumin 4.6 3.5 - 5.0 g/dL   AST <5 (L) 15 - 41 U/L   ALT 33 0 - 44 U/L   Alkaline Phosphatase 48 38 - 126 U/L   Total Bilirubin 0.5 0.0 - 1.2 mg/dL   GFR, Estimated >39 >39 mL/min    Comment: (NOTE) Calculated using the CKD-EPI Creatinine Equation (2021)    Anion gap 10 5 - 15    Comment: Performed at [facility], [address], [place], [place] [address]  Hemoglobin A1c     Status: None   Collection Time: 08/20/23  3:25 PM  Result Value Ref Range   Hgb A1c MFr Bld 4.9 4.8 - 5.6 %    Comment: (NOTE) Diagnosis of Diabetes The following HbA1c ranges recommended by the American Diabetes Association (ADA) may be used as an aid in the diagnosis of diabetes mellitus.  Hemoglobin             Suggested A1C NGSP%              Diagnosis  <5.7                   Non Diabetic  5.7-6.4                Pre-Diabetic  >6.4                   Diabetic  <7.0                   Glycemic control for                       adults with diabetes.     Mean Plasma Glucose 93.93 mg/dL    Comment: Performed at [facility], [address], [place], [place] [address]  Ethanol     Status: None   Collection Time: 08/20/23  3:25 PM  Result Value Ref Range   Alcohol, Ethyl (B) <15 <15 mg/dL    Comment: (NOTE) For medical purposes only. Performed at [facility], [address] [place], [place] [address]   Lipid panel     Status: None   Collection Time: 08/20/23  3:25 PM  Result Value Ref Range   Cholesterol 152 0 - 200 mg/dL   Triglycerides 76 <849 mg/dL   HDL 68 >59 mg/dL   Total CHOL/HDL Ratio 2.2 RATIO   VLDL 15 0 - 40 mg/dL   LDL Cholesterol 69 0 - 99 mg/dL    Comment:        Total Cholesterol/HDL:CHD Risk Coronary Heart Disease Risk Table                     Men   Women  1/2 Average Risk   3.4   3.3  Average Risk       5.0   4.4  2 X Average Risk   9.6    7.1  3 X Average Risk  23.4   11.0        Use the calculated Patient Ratio above and the CHD Risk Table to determine the patient's CHD Risk.        ATP III CLASSIFICATION (LDL):  <100     mg/dL   Optimal  899-870  mg/dL   Near or Above                    Optimal  130-159  mg/dL   Borderline  839-810  mg/dL   High  >809     mg/dL   Very High Performed at [facility], [address], [place], [place] [address]   TSH     Status: None   Collection Time: 08/20/23  3:25 PM  Result Value Ref Range   TSH 0.912 0.350 - 4.500 uIU/mL    Comment: Performed by a 3rd Generation assay with a functional sensitivity of <=0.01 uIU/mL. Performed at [facility], [address] [place], [place] [address]   POCT Urine Drug Screen - (I-Screen)     Status: Abnormal   Collection Time: 08/20/23  3:38 PM  Result Value Ref Range   POC Amphetamine UR None Detected NONE DETECTED (Cut Off Level 1000 ng/mL)   POC Secobarbital (BAR) None Detected NONE DETECTED (Cut Off Level 300 ng/mL)   POC Buprenorphine (BUP) None Detected NONE DETECTED (Cut Off Level 10 ng/mL)   POC Oxazepam (BZO) Positive (A) NONE DETECTED (Cut Off Level 300 ng/mL)   POC Cocaine UR None Detected NONE DETECTED (Cut Off Level 300 ng/mL)   POC Methamphetamine UR None Detected NONE DETECTED (Cut Off Level 1000 ng/mL)   POC Morphine None Detected NONE DETECTED (Cut Off Level 300 ng/mL)   POC Methadone UR None Detected NONE DETECTED (Cut Off Level 300 ng/mL)   POC Oxycodone UR None Detected NONE DETECTED (Cut Off Level 100 ng/mL)   POC Marijuana UR None Detected NONE DETECTED (Cut Off Level 50 ng/mL)  Urinalysis, Routine w reflex microscopic -Urine, Clean Catch     Status: Abnormal   Collection Time: 08/20/23  4:56 PM  Result Value Ref Range   Color, Urine AMBER (A) YELLOW    Comment: BIOCHEMICALS MAY BE AFFECTED BY COLOR   APPearance HAZY (A) CLEAR   Specific Gravity, Urine 1.034 (  H) 1.005 - 1.030   pH 5.0 5.0 - 8.0    Glucose, UA NEGATIVE NEGATIVE mg/dL   Hgb urine dipstick NEGATIVE NEGATIVE   Bilirubin Urine NEGATIVE NEGATIVE   Ketones, ur 20 (A) NEGATIVE mg/dL   Protein, ur 30 (A) NEGATIVE mg/dL   Nitrite NEGATIVE NEGATIVE   Leukocytes,Ua NEGATIVE NEGATIVE   RBC / HPF 0-5 0 - 5 RBC/hpf   WBC, UA 0-5 0 - 5 WBC/hpf   Bacteria, UA RARE (A) NONE SEEN   Squamous Epithelial / HPF 0-5 0 - 5 /HPF   Mucus PRESENT     Comment: Performed at [facility], [address] [place], [place] [address]    Blood Alcohol level:  Lab Results  Component Value Date   [facility] <15 08/20/2023    Metabolic Disorder Labs:  Lab Results  Component Value Date   HGBA1C 4.9 08/20/2023   MPG 93.93 08/20/2023   Lab Results  Component Value Date   PROLACTIN 5.7 08/12/2013   Lab Results  Component Value Date   CHOL 152 08/20/2023   TRIG 76 08/20/2023   HDL 68 08/20/2023   CHOLHDL 2.2 08/20/2023   VLDL 15 08/20/2023   LDLCALC 69 08/20/2023    Current Medications: Current Facility-Administered Medications  Medication Dose Route Frequency Provider Last Rate Last Admin   acetaminophen  (TYLENOL ) tablet 650 mg  650 mg Oral Q6H PRN Tex Drilling, NP       alum & mag hydroxide-simeth (MAALOX/MYLANTA) 200-200-20 MG/5ML suspension 30 mL  30 mL Oral Q4H PRN Tex Drilling, NP       clonazePAM  (KLONOPIN ) tablet 0.5 mg  0.5 mg Oral Daily PRN Yerlin Gasparyan, MD   0.5 mg at 08/21/23 1150   hydrOXYzine  (ATARAX ) tablet 25 mg  25 mg Oral Q6H PRN Cherene Dobbins, MD       magnesium  hydroxide (MILK OF MAGNESIA) suspension 30 mL  30 mL Oral Daily PRN Tex Drilling, NP       OLANZapine  zydis (ZYPREXA ) disintegrating tablet 5 mg  5 mg Oral TID PRN Tex Drilling, NP       pantoprazole  (PROTONIX ) EC tablet 40 mg  40 mg Oral Daily Nkwenti, Doris, NP   40 mg at 08/21/23 1037   polyethylene glycol (MIRALAX  / GLYCOLAX ) packet 17 g  17 g Oral Daily PRN Tex Drilling, NP       QUEtiapine  (SEROQUEL ) tablet 50 mg  50 mg Oral QHS  Kamillah Didonato, MD       sertraline  (ZOLOFT ) tablet 50 mg  50 mg Oral Daily Kailen Hinkle, MD   50 mg at 08/21/23 1150   Vitamin D  (Ergocalciferol ) (DRISDOL ) 1.25 MG (50000 UNIT) capsule 50,000 Units  50,000 Units Oral Q7 days Tex Drilling, NP   50,000 Units at 08/21/23 1150   PTA Medications: Facility-Administered Medications Prior to Admission  Medication Dose Route Frequency Provider Last Rate Last Admin   denosumab  (PROLIA ) injection 60 mg  60 mg Subcutaneous Once Shamleffer, Ibtehal Jaralla, MD       [START ON 12/23/2023] denosumab  (PROLIA ) injection 60 mg  60 mg Subcutaneous Q6 months Shamleffer, Ibtehal Jaralla, MD       Medications Prior to Admission  Medication Sig Dispense Refill Last Dose/Taking   Calcium  200 MG TABS       clonazePAM  (KLONOPIN ) 0.5 MG tablet Take 0.5 mg by mouth daily.      famotidine  (PEPCID ) 20 MG tablet Take 20 mg by mouth 2 (two) times daily.  ondansetron  (ZOFRAN -ODT) 4 MG disintegrating tablet Take 4 mg by mouth every 8 (eight) hours as needed.      pantoprazole  (PROTONIX ) 40 MG tablet Take 1 tablet (40 mg total) by mouth 2 (two) times daily. 60 tablet 5    pantoprazole  (PROTONIX ) 40 MG tablet Take by mouth.      PARoxetine (PAXIL) 30 MG tablet Take 30 mg by mouth daily.      Plecanatide 3 MG TABS Take by mouth.      polyethylene glycol (MIRALAX  / GLYCOLAX ) 17 g packet as needed.      QUEtiapine  (SEROQUEL ) 25 MG tablet Take by mouth. (Patient not taking: Reported on 05/11/2023)      rosuvastatin  (CRESTOR ) 20 MG tablet Take by mouth.      sucralfate  (CARAFATE ) 1 GM/10ML suspension Take by mouth.      Vitamin D , Ergocalciferol , (DRISDOL ) 1.25 MG (50000 UNIT) CAPS capsule Take by mouth.       Psychiatric Specialty Exam:  Presentation  General Appearance:  Appropriate for Environment; Casual  Eye Contact: Fair  Speech: Clear and Coherent  Speech Volume: Normal    Mood and Affect  Mood: Anxious; Depressed  Affect: Depressed;  Flat   Thought Process  Thought Processes: Coherent  Descriptions of Associations:Intact  Orientation:Full (Time, Place and Person)  Thought Content:Illogical; Rumination; Perseveration; Obsessions  Hallucinations:Hallucinations: None  Ideas of Reference:None  Suicidal Thoughts:Suicidal Thoughts: No SI Passive Intent and/or Plan: Without Intent; Without Plan  Homicidal Thoughts:Homicidal Thoughts: No   Sensorium  Memory: Immediate Fair; Recent Fair; Remote Fair  Judgment: Impaired  Insight: Shallow   Executive Functions  Concentration: Poor  Attention Span: Poor  Recall: [employer] of Knowledge: Fair  Language: Fair   Psychomotor Activity  Psychomotor Activity: Psychomotor Activity: Normal   Assets  Assets: Communication Skills; Desire for Improvement; Resilience    Musculoskeletal: Strength & Muscle Tone: within normal limits Gait & Station: normal  Physical Exam: Physical Exam Vitals and nursing note reviewed.  Cardiovascular:     Rate and Rhythm: Normal rate.  Musculoskeletal:     Cervical back: Normal range of motion.  Neurological:     Mental Status: She is alert.    Review of Systems  Constitutional: Negative.   Eyes: Negative.   Cardiovascular: Negative.   Skin: Negative.    Blood pressure 110/69, pulse 70, temperature 97.8 F (36.6 C), resp. rate 16, height 5' 3 (1.6 m), weight 61.7 kg, last menstrual period 02/18/2004, SpO2 98%. Body mass index is 24.09 kg/m.  Principal Diagnosis: OCD (obsessive compulsive disorder) Diagnosis:  Principal Problem:   OCD (obsessive compulsive disorder)   Clinical Decision Making: Patient admitted for worsening anxiety in the context of intrusive thoughts and compulsive behaviors.  Patient was on long-term Klonopin  which has been titrated down abruptly which probably exacerbated the anxiety.  No acute life stressors identified at this time.  Will adjust the dosage of medications and  manage the anxiety.  Her worsening anxiety is making her feel depressed, hopeless and anhedonia which puts her at high risk for self-harm at some point.  She needs ongoing inpatient psychiatric hospitalization.  Treatment Plan Summary:  Safety and Monitoring:             -- Voluntary admission to inpatient psychiatric unit for safety, stabilization and treatment             -- Daily contact with patient to assess and evaluate symptoms and progress in treatment             --  Patient's case to be discussed in multi-disciplinary team meeting             -- Observation Level: q15 minute checks             -- Vital signs:  q12 hours             -- Precautions: suicide, elopement, and assault   2. Psychiatric Diagnoses and Treatment:               Zoloft  50 mg daily, Klonopin  0.5 mg daily as needed, hydroxyzine  25 mg every 6 hours as needed anxiety.   -- The risks/benefits/side-effects/alternatives to this medication were discussed in detail with the patient and time was given for questions. The patient consents to medication trial.                -- Metabolic profile and EKG monitoring obtained while on an atypical antipsychotic (BMI: Lipid Panel: HbgA1c: QTc:)              -- Encouraged patient to participate in unit milieu and in scheduled group therapies                            3. Medical Issues Being Addressed:      4. Discharge Planning:              -- Social work and case management to assist with discharge planning and identification of hospital follow-up needs prior to discharge             -- Estimated LOS: 5-7 days             -- Discharge Concerns: Need to establish a safety plan; Medication compliance and effectiveness             -- Discharge Goals: Return home with outpatient referrals follow ups  Physician Treatment Plan for Primary Diagnosis: OCD (obsessive compulsive disorder) Long Term Goal(s): Improvement in symptoms so as ready for discharge  Short Term Goals:  Ability to identify changes in lifestyle to reduce recurrence of condition will improve, Ability to verbalize feelings will improve, Ability to disclose and discuss suicidal ideas, Ability to demonstrate self-control will improve, and Ability to identify and develop effective coping behaviors will improve  Physician Treatment Plan for Secondary Diagnosis: Principal Problem:   OCD (obsessive compulsive disorder)  Long Term Goal(s): Improvement in symptoms so as ready for discharge  Short Term Goals: Ability to identify changes in lifestyle to reduce recurrence of condition will improve, Ability to verbalize feelings will improve, Ability to disclose and discuss suicidal ideas, Ability to demonstrate self-control will improve, and Ability to identify and develop effective coping behaviors will improve  I certify that inpatient services furnished can reasonably be expected to improve the patient's condition.    Calianne Larue, MD 7/8/20254:35 PM

## 2023-08-21 NOTE — Progress Notes (Signed)
   08/20/23 2200  Psych Admission Type (Psych Patients Only)  Admission Status Voluntary  Psychosocial Assessment  Patient Complaints Anxiety  Eye Contact Fair  Facial Expression Anxious  Affect Anxious;Appropriate to circumstance  Speech Logical/coherent  Interaction Assertive  Motor Activity Restless  Appearance/Hygiene Unremarkable  Behavior Characteristics Cooperative;Anxious  Mood Anxious  Thought Process  Coherency WDL  Content WDL  Delusions None reported or observed  Perception WDL  Hallucination None reported or observed  Judgment Impaired  Confusion Mild  Danger to Self  Current suicidal ideation? Denies  Danger to Others  Danger to Others None reported or observed

## 2023-08-21 NOTE — Group Note (Signed)
 Recreation Therapy Group Note   Group Topic:Coping Skills  Group Date: 08/21/2023 Start Time: 1510 End Time: 1600 Facilitators: Celestia Jeoffrey BRAVO, LRT, CTRS Location: Dayroom  Group Description: Mind Map.  Patient was provided a blank template of a diagram with 32 blank boxes in a tiered system, branching from the center (similar to a bubble chart). LRT directed patients to label the middle of the diagram Coping Skills. LRT and patients then came up with 8 different coping skills as examples. Pt were directed to record their coping skills in the 2nd tier boxes closest to the center.  Patients would then share their coping skills with the group as LRT wrote them out. LRT gave a handout of 99 different coping skills at the end of group.   Goal Area(s) Addressed: Patients will be able to define "coping skills". Patient will identify new coping skills.  Patient will increase communication.   Affect/Mood: Appropriate and Flat   Participation Level: Active and Engaged   Participation Quality: Independent   Behavior: Appropriate, Calm, and Cooperative   Speech/Thought Process: Coherent   Insight: Good   Judgement: Good   Modes of Intervention: Education, Worksheet, and Writing   Patient Response to Interventions:  Attentive, Engaged, Interested , and Receptive   Education Outcome:  Acknowledges education   Clinical Observations/Individualized Feedback: [name] was active in their participation of session activities and group discussion. Pt identified light a candle, play with my puppies, or exercise as coping skills. Pt interacted well with LRT and peers duration of session.    Plan: Continue to engage patient in RT group sessions 2-3x/week.   Jeoffrey BRAVO Celestia, LRT, CTRS 08/21/2023 5:00 PM

## 2023-08-21 NOTE — Group Note (Signed)
 Date:  08/21/2023 Time:  9:04 PM  Group Topic/Focus:  Wrap-Up Group:   The focus of this group is to help patients review their daily goal of treatment and discuss progress on daily workbooks.    Participation Level:  Active  Participation Quality:  Attentive  Affect:  Appropriate  Cognitive:  Appropriate  Insight: Appropriate  Engagement in Group:  Engaged  Modes of Intervention:  Discussion  Additional Comments:    [name] CHRISTELLA Bunker 08/21/2023, 9:04 PM

## 2023-08-21 NOTE — Tx Team (Signed)
 Initial Treatment Plan 08/21/2023 5:30 AM [name] FMW:[id number]    PATIENT STRESSORS: Medication change or noncompliance     PATIENT STRENGTHS: Ability for insight  Motivation for treatment/growth  Religious Affiliation  Supportive family/friends    PATIENT IDENTIFIED PROBLEMS: Anxiety  Passive SI  Depression                 DISCHARGE CRITERIA:  Ability to meet basic life and health needs Adequate post-discharge living arrangements Improved stabilization in mood, thinking, and/or behavior Safe-care adequate arrangements made Verbal commitment to aftercare and medication compliance  PRELIMINARY DISCHARGE PLAN: Outpatient therapy Placement in alternative living arrangements Return to previous living arrangement  PATIENT/FAMILY INVOLVEMENT: This treatment plan has been presented to and reviewed with the patient, [name]. The patient and family have been given the opportunity to ask questions and make suggestions.  Steward LITTIE Bracket, RN 08/21/2023, 5:30 AM

## 2023-08-21 NOTE — Group Note (Signed)
 Recreation Therapy Group Note   Group Topic:Stress Management  Group Date: 08/21/2023 Start Time: 1100 End Time: 1135 Facilitators: Celestia Jeoffrey BRAVO, LRT, CTRS Location: Dayroom  Group Description: Meditation. LRT educated on the benefits of meditation and relaxation techniques, and how it can apply to everyday life post-discharge. LRT and pt's followed along to an audio script of a "guided meditation" video. LRT facilitated post-activity processing to gain feedback on session.   Goal Area(s) Addressed:  Patient will practice using relaxation technique. Patient will identify a new coping skill.  Patient will follow multistep directions to reduce anxiety and stress.   Affect/Mood: Appropriate   Participation Level: Minimal    Clinical Observations/Individualized Feedback: [name] was originally present in group. MD pulled pt and pt did not return.   Plan: Continue to engage patient in RT group sessions 2-3x/week.   Jeoffrey BRAVO Celestia, LRT, CTRS 08/21/2023 2:10 PM

## 2023-08-21 NOTE — Group Note (Signed)
 Date:  08/21/2023 Time:  11:07 AM  Group Topic/Focus:  Outside Rec/Music Therapy  The purpose of this group is too allow patients to go out and get fresh air while doing some outside activities. Also listening to soothing music     Participation Level:  Active  Participation Quality:  Appropriate  Affect:  Appropriate  Cognitive:  Appropriate  Insight: Appropriate  Engagement in Group:  Engaged  Modes of Intervention:  Activity  Additional Comments:   Beatris ONEIDA Hasten 08/21/2023, 11:07 AM

## 2023-08-21 NOTE — Progress Notes (Signed)
 Pt admitted to [facility] from [facility] voluntary consent. Report received from RN prior to admission. Pt arrived via safe transport. Pt AOx4. Pt anxious but, appropriate during assessment/admission. VSS. Pt ABCs intact. RR even and unlabored. Pt in NAD. No behavioral concerns at this time pt pleasant and cooperative on approach.  Pt ambulatory. Pt reports she is here because I have severe anxiety and has some disassociation's in my apartment. I have been having vivid dreams and I wake up in a sweat scared. It makes me not want to be here anymore. I am not suicidal though. Pt denies SI/HI/AVH. Pt verbally contracts for safety with this Clinical research associate. Anxiety 10/10. Pt denies feeling depressed. Pt reports current mood I am scared. I am so anxious. Pt given education, support, and encouragement to be active in her treatment plan. Pt oriented to the unit and her room, given snack. Skin assessment completed with RN x2 no acute findings see flowsheet. Pt being monitored Q 15 minutes for safety per unit protocol, remains safe on the unit. Pt changed out into paper scrubs. No contraband found. Personal belongings searched and allowed items given to the pt at bedside. Home medications taken to pharmacy for storage. Items not allowed placed in locker number 13. POC.

## 2023-08-21 NOTE — Progress Notes (Signed)
   08/21/23 1200  Psych Admission Type (Psych Patients Only)  Admission Status Voluntary  Psychosocial Assessment  Patient Complaints Anxiety  Eye Contact Fair  Facial Expression Anxious  Affect Anxious;Appropriate to circumstance  Speech Logical/coherent  Interaction Assertive  Motor Activity Restless  Appearance/Hygiene Unremarkable  Behavior Characteristics Cooperative;Anxious;Restless  Mood Anxious  Thought Process  Coherency WDL  Content WDL  Delusions None reported or observed  Perception WDL  Hallucination None reported or observed  Judgment Impaired  Confusion Mild  Danger to Self  Current suicidal ideation? Denies  Danger to Others  Danger to Others None reported or observed

## 2023-08-21 NOTE — Plan of Care (Signed)
 Pt new to the unit, has not had time to progress.   Problem: Education: Goal: Ability to state activities that reduce stress will improve 08/21/2023 0550 by Geneva Steward CROME, RN Outcome: Not Progressing 08/21/2023 0524 by Geneva Steward CROME, RN Outcome: Progressing   Problem: Coping: Goal: Ability to identify and develop effective coping behavior will improve 08/21/2023 0550 by Geneva Steward CROME, RN Outcome: Not Progressing 08/21/2023 0524 by Geneva Steward CROME, RN Outcome: Progressing   Problem: Self-Concept: Goal: Ability to identify factors that promote anxiety will improve 08/21/2023 0550 by Geneva Steward CROME, RN Outcome: Not Progressing 08/21/2023 0524 by Geneva Steward CROME, RN Outcome: Progressing Goal: Level of anxiety will decrease 08/21/2023 0550 by Geneva Steward CROME, RN Outcome: Not Progressing 08/21/2023 0524 by Geneva Steward CROME, RN Outcome: Progressing Goal: Ability to modify response to factors that promote anxiety will improve 08/21/2023 0550 by Geneva Steward CROME, RN Outcome: Not Progressing 08/21/2023 0524 by Geneva Steward CROME, RN Outcome: Progressing   Problem: Education: Goal: Knowledge of [place] [facility] Education information/materials will improve 08/21/2023 0550 by Geneva Steward CROME, RN Outcome: Not Progressing 08/21/2023 0524 by Geneva Steward CROME, RN Outcome: Progressing Goal: Emotional status will improve 08/21/2023 0550 by Geneva Steward CROME, RN Outcome: Not Progressing 08/21/2023 0524 by Geneva Steward CROME, RN Outcome: Progressing Goal: Mental status will improve 08/21/2023 0550 by Geneva Steward CROME, RN Outcome: Not Progressing 08/21/2023 0524 by Geneva Steward CROME, RN Outcome: Progressing Goal: Verbalization of understanding the information provided will improve 08/21/2023 0550 by Geneva Steward CROME, RN Outcome: Not Progressing 08/21/2023 0524 by Geneva Steward CROME, RN Outcome: Progressing   Problem: Activity: Goal: Interest or engagement in activities will improve 08/21/2023 0550 by Geneva Steward CROME,  RN Outcome: Not Progressing 08/21/2023 0524 by Geneva Steward CROME, RN Outcome: Progressing Goal: Sleeping patterns will improve 08/21/2023 0550 by Geneva Steward CROME, RN Outcome: Not Progressing 08/21/2023 0524 by Geneva Steward CROME, RN Outcome: Progressing   Problem: Coping: Goal: Ability to verbalize frustrations and anger appropriately will improve 08/21/2023 0550 by Geneva Steward CROME, RN Outcome: Not Progressing 08/21/2023 0524 by Geneva Steward CROME, RN Outcome: Progressing Goal: Ability to demonstrate self-control will improve 08/21/2023 0550 by Geneva Steward CROME, RN Outcome: Not Progressing 08/21/2023 0524 by Geneva Steward CROME, RN Outcome: Progressing   Problem: Health Behavior/Discharge Planning: Goal: Identification of resources available to assist in meeting health care needs will improve 08/21/2023 0550 by Geneva Steward CROME, RN Outcome: Not Progressing 08/21/2023 0524 by Geneva Steward CROME, RN Outcome: Progressing Goal: Compliance with treatment plan for underlying cause of condition will improve 08/21/2023 0550 by Geneva Steward CROME, RN Outcome: Not Progressing 08/21/2023 0524 by Geneva Steward CROME, RN Outcome: Progressing   Problem: Physical Regulation: Goal: Ability to maintain clinical measurements within normal limits will improve 08/21/2023 0550 by Geneva Steward CROME, RN Outcome: Not Progressing 08/21/2023 0524 by Geneva Steward CROME, RN Outcome: Progressing   Problem: Safety: Goal: Periods of time without injury will increase 08/21/2023 0550 by Geneva Steward CROME, RN Outcome: Not Progressing 08/21/2023 0524 by Geneva Steward CROME, RN Outcome: Progressing

## 2023-08-21 NOTE — Group Note (Signed)
 [facility] LCSW Group Therapy Note   Group Date: 08/21/2023 Start Time: 1300 End Time: 1400   Type of Therapy and Topic: Group Therapy: Avoiding Self-Sabotaging and Enabling Behaviors  Participation Level: Active  Mood:  Description of Group:  In this group, patients will learn how to identify obstacles, self-sabotaging and enabling behaviors, as well as: what are they, why do we do them and what needs these behaviors meet. Discuss unhealthy relationships and how to have positive healthy boundaries with those that sabotage and enable. Explore aspects of self-sabotage and enabling in yourself and how to limit these self-destructive behaviors in everyday life.   Therapeutic Goals: 1. Patient will identify one obstacle that relates to self-sabotage and enabling behaviors 2. Patient will identify one personal self-sabotaging or enabling behavior they did prior to admission 3. Patient will state a plan to change the above identified behavior 4. Patient will demonstrate ability to communicate their needs through discussion and/or role play.    Summary of Patient Progress:   V   Therapeutic Modalities:  Cognitive Behavioral Therapy Person-Centered Therapy Motivational Interviewing    [name], LCSWA

## 2023-08-21 NOTE — Progress Notes (Signed)
   08/21/23 2000  Psych Admission Type (Psych Patients Only)  Admission Status Voluntary  Psychosocial Assessment  Patient Complaints Anxiety  Eye Contact Fair  Facial Expression Anxious  Affect Anxious;Appropriate to circumstance  Speech Logical/coherent  Interaction Assertive  Motor Activity Pacing;Restless  Appearance/Hygiene Unremarkable  Behavior Characteristics Cooperative;Anxious;Restless  Mood Anxious  Thought Process  Coherency WDL  Content WDL  Delusions None reported or observed  Perception WDL  Hallucination None reported or observed  Judgment Impaired  Confusion Mild  Danger to Others  Danger to Others None reported or observed

## 2023-08-21 NOTE — B[facility] Suicide Risk Assessment (Signed)
 [facility] Admission Suicide Risk Assessment   Nursing information obtained from:    Demographic factors:  Age 68 or older, Living alone Current Mental Status:  NA Loss Factors:  NA Historical Factors:  NA Risk Reduction Factors:  NA  Total Time spent with patient: 30 minutes Principal Problem: OCD (obsessive compulsive disorder) Diagnosis:  Principal Problem:   OCD (obsessive compulsive disorder)  Subjective Data: [name] 68 year old female accompanied by her friend [name]) reports for the past 10 days experience anxiety and desocialization, I just don't feel like I am home. History of dx anxiety, eating disorder, and OCD. Report her psychiatrist Dr. Elouise Rous with [facility] thinks it's her OCD thoughts. Therapist Warren Delaine and eating disorder dietitian. Report when wakes up with sweats and does not know where she it. During writer's encounter with patient, mood is depressed, & is extremely anxious, reports being unsure if I can go on and live like this . Patient is admitted to [facility] unit with Q15 min safety monitoring. Multidisciplinary team approach is offered. Medication management; group/milieu therapy is offered.   Continued Clinical Symptoms:  Alcohol Use Disorder Identification Test Final Score (AUDIT): 0 The Alcohol Use Disorders Identification Test, Guidelines for Use in Primary Care, Second Edition.  World Science writer [facility]). Score between 0-7:  no or low risk or alcohol related problems. Score between 8-15:  moderate risk of alcohol related problems. Score between 16-19:  high risk of alcohol related problems. Score 20 or above:  warrants further diagnostic evaluation for alcohol dependence and treatment.   CLINICAL FACTORS:   Severe Anxiety and/or Agitation   Musculoskeletal: Strength & Muscle Tone: within normal limits Gait & Station: normal Patient leans: N/A  Psychiatric Specialty Exam:  Presentation  General Appearance:   Appropriate for Environment; Casual  Eye Contact: Fair  Speech: Clear and Coherent  Speech Volume: Normal  Handedness: Right   Mood and Affect  Mood: Anxious; Depressed  Affect: Depressed; Flat   Thought Process  Thought Processes: Coherent  Descriptions of Associations:Intact  Orientation:Full (Time, Place and Person)  Thought Content:Illogical; Rumination; Perseveration; Obsessions  History of Schizophrenia/Schizoaffective disorder:No data recorded Duration of Psychotic Symptoms:No data recorded Hallucinations:Hallucinations: None  Ideas of Reference:None  Suicidal Thoughts:Suicidal Thoughts: No SI Passive Intent and/or Plan: Without Intent; Without Plan  Homicidal Thoughts:Homicidal Thoughts: No   Sensorium  Memory: Immediate Fair; Recent Fair; Remote Fair  Judgment: Impaired  Insight: Shallow   Executive Functions  Concentration: Poor  Attention Span: Poor  Recall: [employer] of Knowledge: Fair  Language: Fair   Psychomotor Activity  Psychomotor Activity: Psychomotor Activity: Normal   Assets  Assets: Communication Skills; Desire for Improvement; Resilience   Sleep  Sleep: Sleep: Fair    Physical Exam: Physical Exam Vitals and nursing note reviewed.  HENT:     Head: Normocephalic.     Nose: Nose normal.     Mouth/Throat:     Mouth: Mucous membranes are moist.  Cardiovascular:     Pulses: Normal pulses.  Pulmonary:     Effort: Pulmonary effort is normal.  Neurological:     Mental Status: She is alert.    Review of Systems  Constitutional: Negative.   Skin: Negative.    Blood pressure 110/69, pulse 70, temperature 97.8 F (36.6 C), resp. rate 16, height 5' 3 (1.6 m), weight 61.7 kg, last menstrual period 02/18/2004, SpO2 98%. Body mass index is 24.09 kg/m.   COGNITIVE FEATURES THAT CONTRIBUTE TO RISK:  None  SUICIDE RISK:   Minimal: No identifiable suicidal ideation.  Patients presenting with  no risk factors but with morbid ruminations; may be classified as minimal risk based on the severity of the depressive symptoms  PLAN OF CARE: Patient is admitted to [facility] psych unit with Q15 min safety monitoring. Multidisciplinary team approach is offered. Medication management; group/milieu therapy is offered.   I certify that inpatient services furnished can reasonably be expected to improve the patient's condition.   Allyn Foil, MD 08/21/2023, 4:33 PM

## 2023-08-22 DIAGNOSIS — F422 Mixed obsessional thoughts and acts: Secondary | ICD-10-CM | POA: Diagnosis not present

## 2023-08-22 MED ORDER — CLONAZEPAM 0.5 MG PO TABS
0.5000 mg | ORAL_TABLET | Freq: Two times a day (BID) | ORAL | Status: DC
  Administered 2023-08-22 – 2023-08-24 (×5): 0.5 mg via ORAL
  Filled 2023-08-22 (×5): qty 1

## 2023-08-22 MED ORDER — SERTRALINE HCL 50 MG PO TABS
100.0000 mg | ORAL_TABLET | Freq: Every day | ORAL | Status: DC
  Administered 2023-08-23 – 2023-08-30 (×8): 100 mg via ORAL
  Filled 2023-08-22 (×8): qty 2

## 2023-08-22 NOTE — BH IP Treatment Plan (Signed)
 Interdisciplinary Treatment and Diagnostic Plan Update  08/22/2023 Time of Session: 10:23 AM  [name] MRN: [id number]  Principal Diagnosis: OCD (obsessive compulsive disorder)  Secondary Diagnoses: Principal Problem:   OCD (obsessive compulsive disorder)   Current Medications:  Current Facility-Administered Medications  Medication Dose Route Frequency Provider Last Rate Last Admin   acetaminophen  (TYLENOL ) tablet 650 mg  650 mg Oral Q6H PRN Tex Drilling, NP       alum & mag hydroxide-simeth (MAALOX/MYLANTA) 200-200-20 MG/5ML suspension 30 mL  30 mL Oral Q4H PRN Nkwenti, Doris, NP       clonazePAM  (KLONOPIN ) tablet 0.5 mg  0.5 mg Oral BID Jadapalle, Sree, MD       hydrOXYzine  (ATARAX ) tablet 25 mg  25 mg Oral Q6H PRN Jadapalle, Sree, MD       magnesium  hydroxide (MILK OF MAGNESIA) suspension 30 mL  30 mL Oral Daily PRN Nkwenti, Doris, NP       OLANZapine  zydis (ZYPREXA ) disintegrating tablet 5 mg  5 mg Oral TID PRN Tex Drilling, NP       pantoprazole  (PROTONIX ) EC tablet 40 mg  40 mg Oral Daily Nkwenti, Doris, NP   40 mg at 08/22/23 0935   polyethylene glycol (MIRALAX  / GLYCOLAX ) packet 17 g  17 g Oral Daily PRN Tex Drilling, NP       QUEtiapine  (SEROQUEL ) tablet 50 mg  50 mg Oral QHS Jadapalle, Sree, MD   50 mg at 08/21/23 2226   [START ON 08/23/2023] sertraline  (ZOLOFT ) tablet 100 mg  100 mg Oral Daily Jadapalle, Sree, MD       Vitamin D  (Ergocalciferol ) (DRISDOL ) 1.25 MG (50000 UNIT) capsule 50,000 Units  50,000 Units Oral Q7 days Tex Drilling, NP   50,000 Units at 08/21/23 1150   PTA Medications: Facility-Administered Medications Prior to Admission  Medication Dose Route Frequency Provider Last Rate Last Admin   denosumab  (PROLIA ) injection 60 mg  60 mg Subcutaneous Once Shamleffer, Ibtehal Jaralla, MD       [START ON 12/23/2023] denosumab  (PROLIA ) injection 60 mg  60 mg Subcutaneous Q6 months Shamleffer, Ibtehal Jaralla, MD       Medications Prior to Admission   Medication Sig Dispense Refill Last Dose/Taking   Calcium  200 MG TABS       clonazePAM  (KLONOPIN ) 0.5 MG tablet Take 0.5 mg by mouth daily.      famotidine  (PEPCID ) 20 MG tablet Take 20 mg by mouth 2 (two) times daily.      ondansetron  (ZOFRAN -ODT) 4 MG disintegrating tablet Take 4 mg by mouth every 8 (eight) hours as needed.      pantoprazole  (PROTONIX ) 40 MG tablet Take 1 tablet (40 mg total) by mouth 2 (two) times daily. 60 tablet 5    pantoprazole  (PROTONIX ) 40 MG tablet Take by mouth.      PARoxetine (PAXIL) 30 MG tablet Take 30 mg by mouth daily.      Plecanatide 3 MG TABS Take by mouth.      polyethylene glycol (MIRALAX  / GLYCOLAX ) 17 g packet as needed.      QUEtiapine  (SEROQUEL ) 25 MG tablet Take by mouth. (Patient not taking: Reported on 05/11/2023)      rosuvastatin  (CRESTOR ) 20 MG tablet Take by mouth.      sucralfate  (CARAFATE ) 1 GM/10ML suspension Take by mouth.      Vitamin D , Ergocalciferol , (DRISDOL ) 1.25 MG (50000 UNIT) CAPS capsule Take by mouth.       Patient Stressors: Medication change or noncompliance  Patient Strengths: Ability for insight  Motivation for treatment/growth  Religious Affiliation  Supportive family/friends   Treatment Modalities: Medication Management, Group therapy, Case management,  1 to 1 session with clinician, Psychoeducation, Recreational therapy.   Physician Treatment Plan for Primary Diagnosis: OCD (obsessive compulsive disorder) Long Term Goal(s): Improvement in symptoms so as ready for discharge   Short Term Goals: Ability to identify changes in lifestyle to reduce recurrence of condition will improve Ability to verbalize feelings will improve Ability to disclose and discuss suicidal ideas Ability to demonstrate self-control will improve Ability to identify and develop effective coping behaviors will improve  Medication Management: Evaluate patient's response, side effects, and tolerance of medication regimen.  Therapeutic  Interventions: 1 to 1 sessions, Unit Group sessions and Medication administration.  Evaluation of Outcomes: Not Progressing  Physician Treatment Plan for Secondary Diagnosis: Principal Problem:   OCD (obsessive compulsive disorder)  Long Term Goal(s): Improvement in symptoms so as ready for discharge   Short Term Goals: Ability to identify changes in lifestyle to reduce recurrence of condition will improve Ability to verbalize feelings will improve Ability to disclose and discuss suicidal ideas Ability to demonstrate self-control will improve Ability to identify and develop effective coping behaviors will improve     Medication Management: Evaluate patient's response, side effects, and tolerance of medication regimen.  Therapeutic Interventions: 1 to 1 sessions, Unit Group sessions and Medication administration.  Evaluation of Outcomes: Not Progressing   RN Treatment Plan for Primary Diagnosis: OCD (obsessive compulsive disorder) Long Term Goal(s): Knowledge of disease and therapeutic regimen to maintain health will improve  Short Term Goals: Ability to remain free from injury will improve, Ability to verbalize frustration and anger appropriately will improve, Ability to demonstrate self-control, Ability to participate in decision making will improve, Ability to verbalize feelings will improve, Ability to disclose and discuss suicidal ideas, Ability to identify and develop effective coping behaviors will improve, and Compliance with prescribed medications will improve  Medication Management: RN will administer medications as ordered by provider, will assess and evaluate patient's response and provide education to patient for prescribed medication. RN will report any adverse and/or side effects to prescribing provider.  Therapeutic Interventions: 1 on 1 counseling sessions, Psychoeducation, Medication administration, Evaluate responses to treatment, Monitor vital signs and CBGs as  ordered, Perform/monitor CIWA, COWS, AIMS and Fall Risk screenings as ordered, Perform wound care treatments as ordered.  Evaluation of Outcomes: Not Progressing   LCSW Treatment Plan for Primary Diagnosis: OCD (obsessive compulsive disorder) Long Term Goal(s): Safe transition to appropriate next level of care at discharge, Engage patient in therapeutic group addressing interpersonal concerns.  Short Term Goals: Engage patient in aftercare planning with referrals and resources, Increase social support, Increase ability to appropriately verbalize feelings, Increase emotional regulation, Facilitate acceptance of mental health diagnosis and concerns, Facilitate patient progression through stages of change regarding substance use diagnoses and concerns, Identify triggers associated with mental health/substance abuse issues, and Increase skills for wellness and recovery  Therapeutic Interventions: Assess for all discharge needs, 1 to 1 time with Social worker, Explore available resources and support systems, Assess for adequacy in community support network, Educate family and significant other(s) on suicide prevention, Complete Psychosocial Assessment, Interpersonal group therapy.  Evaluation of Outcomes: Not Progressing   Progress in Treatment: Attending groups: Yes. and No. Participating in groups: Yes. and No. Taking medication as prescribed: Yes. Toleration medication: Yes. Family/Significant other contact made: No, will contact:  CSW will contact if given permission Patient understands diagnosis:  Yes. Discussing patient identified problems/goals with staff: Yes. Medical problems stabilized or resolved: Yes. Denies suicidal/homicidal ideation: Yes. Issues/concerns per patient self-inventory: No. Other: None  New problem(s) identified: No, Describe:  None identified   New Short Term/Long Term Goal(s): elimination of symptoms of psychosis, medication management for mood stabilization;  elimination of SI thoughts; development of comprehensive mental wellness/sobriety plan.   Patient Goals:  To become feeling connected and not so panic driven  Discharge Plan or Barriers: CSW will assist with appropriate discharge planning   Reason for Continuation of Hospitalization: Anxiety Medication stabilization Withdrawal symptoms  Estimated Length of Stay: 1 to 7 days   Last 3 [place] Suicide Severity Risk Score: Flowsheet Row Admission (Current) from 08/20/2023 in [facility] Most recent reading at 08/20/2023 10:00 PM ED from 08/20/2023 in [facility] Most recent reading at 08/20/2023  4:32 PM ED from 02/15/2022 in [facility] Emergency Department at [facility] Most recent reading at 02/15/2022  3:50 PM  C-SSRS RISK CATEGORY No Risk No Risk No Risk    Last PHQ 2/9 Scores:    03/07/2017    8:12 AM 01/31/2017    8:28 AM 01/17/2017   10:11 AM  Depression screen PHQ 2/9  Decreased Interest 0 0 0  Down, Depressed, Hopeless 0 0 0  PHQ - 2 Score 0 0 0    Scribe for Treatment Team: Lum [name], [place] 08/22/2023 1:35 PM

## 2023-08-22 NOTE — Group Note (Signed)
 Physical/Occupational Therapy Group Note  Group Topic: Transfer Training   Group Date: 08/22/2023 Start Time: 1300 End Time: 1330 Facilitators: Peretz Thieme, Alm Hamilton, PT   Group Description: Group educated on sequence and techniques to maximize safety with functional transfers.  Additionally, integrated education on impact of seating surfaces, use of assistive device and management of orthostasis with movement transitions.  Patients actively engaged with functional transfers (sit/stand) from various seating surfaces, with and without assist devices, working to integrate and retain education provided during session.  Allowed time for questions and further discussion on mobility concerns/needs.   Therapeutic Goal(s):  Identify and demonstrate safe technique for sit/stand transfers from various seating surfaces. Identify and demonstrate safe use of assistive devices with basic transfers and simple mobility. Identify and demonstrate ability to recognize signs/symptoms of orthostasis and appropriate compensatory/safety techniques.  Individual Participation: Pt actively participated with the discussion and physical activity components of the session.  Pt was steady with standing activities without the need of an AD.  Participation Level: Active and Engaged   Participation Quality: Minimal Cues   Behavior: Appropriate and Attentive    Speech/Thought Process: Coherent, Focused, and Relevant   Affect/Mood: Appropriate   Insight: Good   Judgement: Good   Individualization: Per above   Modes of Intervention: Activity, Discussion, and Education  Patient Response to Interventions:  Attentive, Engaged, Interested , and Receptive   Plan: Continue to engage patient in PT/OT groups 1 - 2x/week.  CHARM Hamilton Bertin PT, DPT 08/22/23, 4:28 PM

## 2023-08-22 NOTE — Group Note (Signed)
 Date:  08/22/2023 Time:  11:22 AM  Group Topic/Focus:  Emotional Education:   The focus of this group is to discuss what feelings/emotions are, and how they are experienced.    Participation Level:  Active  Participation Quality:  Appropriate  Affect:  Appropriate  Cognitive:  Appropriate  Insight: Appropriate  Engagement in Group:  Engaged  Modes of Intervention:  Discussion   [name] 08/22/2023, 11:22 AM

## 2023-08-22 NOTE — Progress Notes (Signed)
 [facility] MD Progress Note  08/22/2023 9:00 PM [name]  MRN:  [id number]  [name] 68 year old female accompanied by [name]) reports for the past 10 days experience anxiety and desocialization, I just don't feel like I am home. History of dx anxiety, eating disorder, and OCD. Report her psychiatrist Dr. Elouise Rous with [facility] thinks it's her OCD thoughts. Therapist Warren Delaine and eating disorder dietitian. Report when wakes up with sweats and does not know where she it. During writer's encounter with patient, mood is depressed, & is extremely anxious, reports being unsure if I can go on and live like this . Patient is admitted to [facility] unit with Q15 min safety monitoring. Multidisciplinary team approach is offered. Medication management; group/milieu therapy is offered.   Subjective:  Chart reviewed, case discussed in multidisciplinary meeting, patient seen during rounds.  Patient met with the treatment team and remains focused on the derealization and had a fear of the symptoms she had when she woke up at home.  She remains hyper fixated on the tingling sensations down her arms and is very persistent that her doctor told her that it is fight-or-flight response but denies having any traumatic experience that includes physical abuse or sexual abuse or witnessing any traumatic events in her life.  She reports the trauma in her life is loss of multiple family members.  She denies SI/HI/plan and denies auditory/visual hallucinations.  Provider discussed different treatment plan including EMDR to help with the dissociation episodes patient is reporting.  Patient is encouraged to practice some deep breathing throughout the day.  Provider educated her on the expectations of the action of this medications and the timeframe of seeing a benefit.  Patient also was educated on the treatment resistance of any of the OCD symptoms in general the timeframe it takes for anybody to feel  relieved. Discussed the plan to increase Zoloft  to 100 mg, increase Klonopin  to 0.5 mg twice daily.  Also discussed further plans of either increasing the Seroquel  to assist with the OCD or if patient remains significantly impaired by anxiety will replace Seroquel  with Risperdal which is more evidence-based as an adjunct for OCD treatment.  Can also consider adding clomipramine eventually to help with the ongoing OCD symptoms.   Sleep: Fair  Appetite:  Fair  Past Psychiatric History: see h&P Family History:  Family History  Problem Relation Age of Onset   Polymyalgia rheumatica Mother    Osteoporosis Mother    Cancer Father    Heart disease Father        CHF   Diabetes Sister        Obesity   Stomach cancer Maternal Grandfather    Colon cancer Neg Hx    Esophageal cancer Neg Hx    Rectal cancer Neg Hx    Social History:  Social History   Substance and Sexual Activity  Alcohol Use No     Social History   Substance and Sexual Activity  Drug Use No    Social History   Socioeconomic History   Marital status: Divorced    Spouse name: Not on file   Number of children: 1   Years of education: Not on file   Highest education level: Not on file  Occupational History   Occupation: Scientist, product/process development education    Employer: [employer]   Occupation: Consulting civil engineer   Occupation: Restaurant manager, fast food   Occupation: Architect    Comment: for person's with Parkinson's Disease  Tobacco  Use   Smoking status: Never   Smokeless tobacco: Never  Vaping Use   Vaping status: Never Used  Substance and Sexual Activity   Alcohol use: No   Drug use: No   Sexual activity: Not on file  Other Topics Concern   Not on file  Social History Narrative   ** Merged History Encounter **       Social Drivers of Health   Financial Resource Strain: Low Risk  (04/19/2023)   Received from [facility]   Overall Financial Resource Strain (CARDIA)    Difficulty of Paying Living Expenses: Not  hard at all  Food Insecurity: No Food Insecurity (08/20/2023)   Hunger Vital Sign    Worried About Running Out of Food in the Last Year: Never true    Ran Out of Food in the Last Year: Never true  Transportation Needs: No Transportation Needs (08/20/2023)   PRAPARE - Administrator, Civil Service (Medical): No    Lack of Transportation (Non-Medical): No  Physical Activity: Sufficiently Active (03/12/2023)   [facility]   Exercise Vital Sign    On average, how many days per week do you engage in moderate to strenuous exercise (like a brisk walk)?: 7 days    On average, how many minutes do you engage in exercise at this level?: 60 min  Stress: Stress Concern Present (03/12/2023)   Received from [facility] - Occupational Stress Questionnaire    Feeling of Stress : Very much  Social Connections: Unknown (08/21/2023)   Social Connection and Isolation Panel    Frequency of Communication with Friends and Family: Three times a week    Frequency of Social Gatherings with Friends and Family: Once a week    Attends Religious Services: 1 to 4 times per year    Active Member of [employer] or Organizations: Yes    Attends Banker Meetings: 1 to 4 times per year    Marital Status: Patient declined   Past Medical History:  Past Medical History:  Diagnosis Date   Acute pain of right knee 03/07/2017   Anemia    Anorexia    Anxiety    Bitemporal hemianopsia    Borderline personality disorder (HCC)    Bulimia    Depression    Hyperlipidemia    Ocular migraine    Osteoporosis    Pigmentary retinal dystrophy    Retinitis pigmentosa    TBI (traumatic brain injury) (HCC)     Past Surgical History:  Procedure Laterality Date   CESAREAN SECTION      Current Medications: Current Facility-Administered Medications  Medication Dose Route Frequency Provider Last Rate Last Admin   acetaminophen  (TYLENOL ) tablet 650 mg  650  mg Oral Q6H PRN Nkwenti, Doris, NP       alum & mag hydroxide-simeth (MAALOX/MYLANTA) 200-200-20 MG/5ML suspension 30 mL  30 mL Oral Q4H PRN Nkwenti, Doris, NP       clonazePAM  (KLONOPIN ) tablet 0.5 mg  0.5 mg Oral BID Mamoru Takeshita, MD       hydrOXYzine  (ATARAX ) tablet 25 mg  25 mg Oral Q6H PRN Evangelina Delancey, MD       magnesium  hydroxide (MILK OF MAGNESIA) suspension 30 mL  30 mL Oral Daily PRN Nkwenti, Doris, NP       OLANZapine  zydis (ZYPREXA ) disintegrating tablet 5 mg  5 mg Oral TID PRN Tex Drilling, NP       pantoprazole  (  PROTONIX ) EC tablet 40 mg  40 mg Oral Daily Nkwenti, Doris, NP   40 mg at 08/22/23 0935   polyethylene glycol (MIRALAX  / GLYCOLAX ) packet 17 g  17 g Oral Daily PRN Tex Drilling, NP       QUEtiapine  (SEROQUEL ) tablet 50 mg  50 mg Oral QHS Jadzia Ibsen, MD   50 mg at 08/21/23 2226   [START ON 08/23/2023] sertraline  (ZOLOFT ) tablet 100 mg  100 mg Oral Daily Laray Rivkin, MD       Vitamin D  (Ergocalciferol ) (DRISDOL ) 1.25 MG (50000 UNIT) capsule 50,000 Units  50,000 Units Oral Q7 days Tex Drilling, NP   50,000 Units at 08/21/23 1150    Lab Results: No results found for this or any previous visit (from the past 48 hours).  Blood Alcohol level:  Lab Results  Component Value Date   [facility] <15 08/20/2023    Metabolic Disorder Labs: Lab Results  Component Value Date   HGBA1C 4.9 08/20/2023   MPG 93.93 08/20/2023   Lab Results  Component Value Date   PROLACTIN 5.7 08/12/2013   Lab Results  Component Value Date   CHOL 152 08/20/2023   TRIG 76 08/20/2023   HDL 68 08/20/2023   CHOLHDL 2.2 08/20/2023   VLDL 15 08/20/2023   LDLCALC 69 08/20/2023    Physical Findings: AIMS:  , ,  ,  ,    CIWA:    COWS:      Psychiatric Specialty Exam:  Presentation  General Appearance:  Appropriate for Environment; Casual  Eye Contact: Fair  Speech: Clear and Coherent  Speech Volume: Normal    Mood and Affect  Mood: Anxious;  Depressed  Affect: Depressed; Flat   Thought Process  Thought Processes: Coherent  Descriptions of Associations:Intact  Orientation:Full (Time, Place and Person)  Thought Content:Illogical; Rumination; Perseveration; Obsessions  Hallucinations:Hallucinations: None  Ideas of Reference:None  Suicidal Thoughts:Suicidal Thoughts: No  Homicidal Thoughts:Homicidal Thoughts: No   Sensorium  Memory: Immediate Fair; Recent Fair; Remote Fair  Judgment: Impaired  Insight: Shallow   Executive Functions  Concentration: Poor  Attention Span: Poor  Recall: [employer] of Knowledge: Fair  Language: Fair   Psychomotor Activity  Psychomotor Activity: Psychomotor Activity: Normal  Musculoskeletal: Strength & Muscle Tone: within normal limits Gait & Station: normal Assets  Assets: Manufacturing systems engineer; Desire for Improvement; Resilience    Physical Exam: Physical Exam Vitals and nursing note reviewed.  HENT:     Head: Normocephalic.  Cardiovascular:     Heart sounds: Normal heart sounds.  Neurological:     Mental Status: She is alert.    Review of Systems  Constitutional: Negative.   HENT: Negative.    Eyes: Negative.   Skin: Negative.    Blood pressure 121/66, pulse 71, temperature 97.8 F (36.6 C), resp. rate 20, height 5' 3 (1.6 m), weight 61.7 kg, last menstrual period 02/18/2004, SpO2 97%. Body mass index is 24.09 kg/m.  Clinical decision making: Patient presenting to the inpatient unit with disabling anxiety and endorsing derealization episodes started 10 days ago.  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  Patient has been tapered off of her Klonopin  in the last few weeks after being on Klonopin  for 4 years which probably is making her anxiety worse and go through benzo withdrawal delirium her cognitive clouding.  Patient will be monitored closely and she needs ongoing hospitalization for further management  of her symptoms Diagnosis: Principal Problem:   OCD (obsessive compulsive  disorder)  Treatment Plan Summary:   Safety and Monitoring:             -- Voluntary admission to inpatient psychiatric unit for safety, stabilization and treatment             -- Daily contact with patient to assess and evaluate symptoms and progress in treatment             -- Patient's case to be discussed in multi-disciplinary team meeting             -- Observation Level: q15 minute checks             -- Vital signs:  q12 hours             -- Precautions: suicide, elopement, and assault   2. Psychiatric Diagnoses and Treatment:               Increased Zoloft  to 100 mg daily, Klonopin  increased to 0.5 mg BID, hydroxyzine  25 mg every 6 hours as needed anxiety. Seroquel  50 mg QHS   -- The risks/benefits/side-effects/alternatives to this medication were discussed in detail with the patient and time was given for questions. The patient consents to medication trial.                -- Metabolic profile and EKG monitoring obtained while on an atypical antipsychotic (BMI: Lipid Panel: HbgA1c: QTc:)              -- Encouraged patient to participate in unit milieu and in scheduled group therapies                4. Discharge Planning:   -- Social work and case management to assist with discharge planning and identification of hospital follow-up needs prior to discharge  -- Estimated LOS: 3-4 days  Ayame Rena, MD 08/22/2023, 9:00 PM

## 2023-08-22 NOTE — Plan of Care (Signed)

## 2023-08-22 NOTE — Group Note (Signed)
 Date:  08/22/2023 Time:  10:22 PM  Group Topic/Focus:  Identifying Needs:   The focus of this group is to help patients identify their personal needs that have been historically problematic and identify healthy behaviors to address their needs.    Participation Level:  Active  Participation Quality:  Appropriate  Affect:  Appropriate  Cognitive:  Appropriate  Insight: Appropriate  Engagement in Group:  Engaged  Modes of Intervention:  Education  Additional Comments:    [name] 08/22/2023, 10:22 PM

## 2023-08-22 NOTE — Progress Notes (Signed)
   08/22/23 2100  Psych Admission Type (Psych Patients Only)  Admission Status Voluntary  Psychosocial Assessment  Patient Complaints Anxiety  Eye Contact Fair  Facial Expression Anxious  Affect Appropriate to circumstance  Speech Logical/coherent  Interaction Assertive  Motor Activity Restless  Appearance/Hygiene Unremarkable  Behavior Characteristics Cooperative;Anxious  Thought Process  Coherency WDL  Content WDL  Delusions None reported or observed  Perception WDL  Hallucination None reported or observed  Judgment Impaired  Confusion Mild  Danger to Self  Current suicidal ideation? Denies  Danger to Others  Danger to Others None reported or observed

## 2023-08-22 NOTE — Progress Notes (Signed)
   08/22/23 1200  Psych Admission Type (Psych Patients Only)  Admission Status Voluntary  Psychosocial Assessment  Patient Complaints Anxiety  Eye Contact Fair  Facial Expression Anxious  Affect Anxious;Appropriate to circumstance  Speech Logical/coherent  Interaction Assertive  Motor Activity Restless  Appearance/Hygiene Unremarkable  Behavior Characteristics Cooperative;Anxious  Thought Process  Coherency WDL  Content WDL  Delusions None reported or observed  Perception WDL  Hallucination None reported or observed  Judgment Impaired  Confusion Mild  Danger to Self  Current suicidal ideation? Denies  Danger to Others  Danger to Others None reported or observed

## 2023-08-22 NOTE — Plan of Care (Signed)
  Problem: Self-Concept: Goal: Ability to identify factors that promote anxiety will improve Outcome: Progressing Goal: Level of anxiety will decrease Outcome: Progressing Goal: Ability to modify response to factors that promote anxiety will improve Outcome: Progressing   

## 2023-08-23 DIAGNOSIS — F422 Mixed obsessional thoughts and acts: Secondary | ICD-10-CM | POA: Diagnosis not present

## 2023-08-23 MED ORDER — ROSUVASTATIN CALCIUM 10 MG PO TABS
20.0000 mg | ORAL_TABLET | Freq: Every day | ORAL | Status: DC
  Administered 2023-08-23 – 2023-08-30 (×8): 20 mg via ORAL
  Filled 2023-08-23 (×8): qty 2

## 2023-08-23 MED ORDER — HYDROXYZINE HCL 50 MG PO TABS
50.0000 mg | ORAL_TABLET | Freq: Four times a day (QID) | ORAL | Status: DC | PRN
  Administered 2023-08-23 – 2023-08-29 (×6): 50 mg via ORAL
  Filled 2023-08-23 (×6): qty 1

## 2023-08-23 NOTE — Plan of Care (Signed)

## 2023-08-23 NOTE — Plan of Care (Signed)
 Patient alert and oriented. Denies SI, HI, AVH and pain. Scheduled medications per MAR. Support and encouragement provided.  Routine safety checks conducted every 15 minutes. Patient informed to notify staff with problems or concerns. No adverse drug reactions noted. Patient verbally contracts for safety at this time. Patient interacts well with others on the unit.  Patient remains safe at this time.  Problem: Education: Goal: Ability to state activities that reduce stress will improve Outcome: Progressing   Problem: Coping: Goal: Ability to identify and develop effective coping behavior will improve Outcome: Progressing   Problem: Education: Goal: Knowledge of [facility] Education information/materials will improve Outcome: Progressing Goal: Emotional status will improve Outcome: Progressing Goal: Mental status will improve Outcome: Progressing Goal: Verbalization of understanding the information provided will improve Outcome: Progressing   Problem: Health Behavior/Discharge Planning: Goal: Identification of resources available to assist in meeting health care needs will improve Outcome: Progressing Goal: Compliance with treatment plan for underlying cause of condition will improve Outcome: Progressing   Problem: Safety: Goal: Periods of time without injury will increase Outcome: Progressing

## 2023-08-23 NOTE — Group Note (Signed)
 Date:  08/23/2023 Time:  10:37 AM  Group Topic/Focus:  Movement Therapy    Participation Level:  Active  Participation Quality:  Appropriate  Affect:  Appropriate  Cognitive:  Appropriate  Insight: Appropriate  Engagement in Group:  Engaged  Modes of Intervention:  Activity  Additional Comments:  none  Norleen SHAUNNA Bias 08/23/2023, 10:37 AM

## 2023-08-23 NOTE — Group Note (Signed)
 Recreation Therapy Group Note   Group Topic:Emotion Expression  Group Date: 08/23/2023 Start Time: 1500 End Time: 1550 Facilitators: Celestia Jeoffrey BRAVO, LRT, CTRS Location: Dayroom  Group Description: Painting a Diplomatic Services operational officer. Patients and LRT discuss what it means to be "at peace", what it feels like physically and mentally. Pts are given a canvas and watercolor paint to use and encouraged to draw their idea of a peaceful place. Pts and LRT discuss how they use this in their daily life post discharge. Pts are encouraged to take their canvas home with them as a reminder to find their peaceful place whenever they are feeling depressed, anxious, etc.    Goal Area(s) Addressed:  Patient will identify what it means to experience a "peaceful" emotion. Patient will identify a new coping skill.  Patient will express their emotions through art. Patients will increase communication by talking with LRT and peers while in group.   Affect/Mood: Appropriate and Flat   Participation Level: Active and Engaged   Participation Quality: Independent   Behavior: Appropriate, Calm, and Cooperative   Speech/Thought Process: Coherent   Insight: Good   Judgement: Good   Modes of Intervention: Art   Patient Response to Interventions:  Attentive, Engaged, Interested , and Receptive   Education Outcome:  Acknowledges education   Clinical Observations/Individualized Feedback: [name] was active in their participation of session activities and group discussion. Pt identified this dove exudes peace and tranquility. Pt interacted well with LRT and peers duration of session.    Plan: Continue to engage patient in RT group sessions 2-3x/week.   Jeoffrey BRAVO Celestia, LRT, CTRS 08/23/2023 4:56 PM

## 2023-08-23 NOTE — Group Note (Signed)
 Recreation Therapy Group Note   Group Topic:General Recreation  Group Date: 08/23/2023 Start Time: 1100 End Time: 1135 Facilitators: Celestia Jeoffrey BRAVO, LRT, CTRS Location: Courtyard  Group Description: Outdoor Recreation. Patients had the option to play corn hole, ring toss, bowling or listening to music while outside in the courtyard getting fresh air and sunlight. Patients helped water and prune the raised garden beds. LRT and patients discussed things that they enjoy doing in their free time outside of the hospital. LRT encouraged patients to drink water after being active and getting their heart rate up.   Goal Area(s) Addressed: Patient will identify leisure interests.  Patient will practice healthy decision making. Patient will engage in recreation activity.   Affect/Mood: Appropriate   Participation Level: Active   Participation Quality: Independent   Behavior: Appropriate   Speech/Thought Process: Coherent   Insight: Good   Judgement: Good   Modes of Intervention: Activity   Patient Response to Interventions:  Receptive   Education Outcome:  Acknowledges education   Clinical Observations/Individualized Feedback: [name] was active in their participation of session activities and group discussion. Pt interacted well with LRT and peers duration of session.    Plan: Continue to engage patient in RT group sessions 2-3x/week.   Jeoffrey BRAVO Celestia, LRT, CTRS 08/23/2023 2:08 PM

## 2023-08-23 NOTE — Progress Notes (Signed)
 [facility] MD Progress Note  08/23/2023 9:22 AM [name]  MRN:  [id number]  [name] 68 year old female accompanied by her friend [name]) reports for the past 10 days experience anxiety and desocialization, I just don't feel like I am home. History of dx anxiety, eating disorder, and OCD. Report her psychiatrist Dr. Elouise Rous with [facility] thinks it's her OCD thoughts. Therapist Warren Delaine and eating disorder dietitian. Report when wakes up with sweats and does not know where she it. During writer's encounter with patient, mood is depressed, & is extremely anxious, reports being unsure if I can go on and live like this . Patient is admitted to [facility] unit with Q15 min safety monitoring. Multidisciplinary team approach is offered. Medication management; group/milieu therapy is offered.   Subjective:  Chart reviewed, case discussed in multidisciplinary meeting, patient seen during rounds.   Discussed the plan to increase Zoloft  to 100 mg, increase Klonopin  to 0.5 mg twice daily.  Also discussed further plans of either increasing the Seroquel  to assist with the OCD or if patient remains significantly impaired by anxiety will replace Seroquel  with Risperdal which is more evidence-based as an adjunct for OCD treatment.  Can also consider adding clomipramine eventually to help with the ongoing OCD symptoms.  On interview today patient continues to report that she feels the same but on observation she looks pretty calm and her vitals are very stable.  When she is in the milieu with staff and peers she is the calm person.  She continues to talk about experiences of derealization but is unable to give any details while she is on the unit.  She reports she is sleeping well at night but talks about night sweats.  When provider tried to educate her about any medical issues that could cause night sweats or tingling she dismisses the conversation and states she was told that it is all anxiety.   Provider tried to educate her that just medications are not going acute everything and that she needs to work with exposure response prevention therapy or even try EMDR for the episodes of dissociation that she reports which has not been witnessed since her admission.  On observation both by therapists and staff and this provider patient is very much in touch with reality and very much engaging.  She denies SI/HI/plan and denies hallucinations.  Patient was very upset about the treatment plan that was discussed today for OCD-switching her Seroquel  to Risperdal and adding clomipramine as an adjunct for the OCD symptoms.  Called:Information for Dr. Rous [phone] Call or Text-Dr. Waldemar had extensive discussion about the treatment plan and the struggles he had and implementing the treatment plan for OCD with the patient for a long time.  He made it very clear that patient will not be continued on any benzos and it has been finalized with both PCP and the psychiatrist.  He requested patient to be transitioned off of benzos and replace Seroquel  with Risperdal I will as he has been having this discussions multiple times with the patient to help with the OCD.  He also informed that he has looked into for residential facilities for OCD where patient is recommended to but patient has been not following any of the treatment plan and remains fixated on the benzodiazepines for anxiety and OCD.    [email or web address]   Sleep: Fair  Appetite:  Fair  Past Psychiatric History: see h&P Family History:  Family History  Problem Relation Age of Onset  Polymyalgia rheumatica Mother    Osteoporosis Mother    Cancer Father    Heart disease Father        CHF   Diabetes Sister        Obesity   Stomach cancer Maternal Grandfather    Colon cancer Neg Hx    Esophageal cancer Neg Hx    Rectal cancer Neg Hx    Social History:  Social History   Substance and Sexual Activity  Alcohol Use No      Social History   Substance and Sexual Activity  Drug Use No    Social History   Socioeconomic History   Marital status: Divorced    Spouse name: Not on file   Number of children: 1   Years of education: Not on file   Highest education level: Not on file  Occupational History   Occupation: Scientist, product/process development education    Employer: [employer]   Occupation: Consulting civil engineer   Occupation: Restaurant manager, fast food   Occupation: Architect    Comment: for person's with Parkinson's Disease  Tobacco Use   Smoking status: Never   Smokeless tobacco: Never  Vaping Use   Vaping status: Never Used  Substance and Sexual Activity   Alcohol use: No   Drug use: No   Sexual activity: Not on file  Other Topics Concern   Not on file  Social History Narrative   ** Merged History Encounter **       Social Drivers of Health   Financial Resource Strain: Low Risk  (04/19/2023)   Received from [facility]   Overall Financial Resource Strain (CARDIA)    Difficulty of Paying Living Expenses: Not hard at all  Food Insecurity: No Food Insecurity (08/20/2023)   Hunger Vital Sign    Worried About Running Out of Food in the Last Year: Never true    Ran Out of Food in the Last Year: Never true  Transportation Needs: No Transportation Needs (08/20/2023)   PRAPARE - Administrator, Civil Service (Medical): No    Lack of Transportation (Non-Medical): No  Physical Activity: Sufficiently Active (03/12/2023)   Received from [facility]   Exercise Vital Sign    On average, how many days per week do you engage in moderate to strenuous exercise (like a brisk walk)?: 7 days    On average, how many minutes do you engage in exercise at this level?: 60 min  Stress: Stress Concern Present (03/12/2023)   Received from [facility] of Occupational Health - Occupational Stress Questionnaire    Feeling of Stress : Very much  Social Connections: Unknown (08/21/2023)   Social Connection and  Isolation Panel    Frequency of Communication with Friends and Family: Three times a week    Frequency of Social Gatherings with Friends and Family: Once a week    Attends Religious Services: 1 to 4 times per year    Active Member of [employer] or Organizations: Yes    Attends Banker Meetings: 1 to 4 times per year    Marital Status: Patient declined   Past Medical History:  Past Medical History:  Diagnosis Date   Acute pain of right knee 03/07/2017   Anemia    Anorexia    Anxiety    Bitemporal hemianopsia    Borderline personality disorder (HCC)    Bulimia    Depression    Hyperlipidemia    Ocular migraine    Osteoporosis  Pigmentary retinal dystrophy    Retinitis pigmentosa    TBI (traumatic brain injury) (HCC)     Past Surgical History:  Procedure Laterality Date   CESAREAN SECTION      Current Medications: Current Facility-Administered Medications  Medication Dose Route Frequency Provider Last Rate Last Admin   acetaminophen  (TYLENOL ) tablet 650 mg  650 mg Oral Q6H PRN Nkwenti, Doris, NP       alum & mag hydroxide-simeth (MAALOX/MYLANTA) 200-200-20 MG/5ML suspension 30 mL  30 mL Oral Q4H PRN Nkwenti, Doris, NP       clonazePAM  (KLONOPIN ) tablet 0.5 mg  0.5 mg Oral BID Keyanah Kozicki, MD   0.5 mg at 08/23/23 9088   hydrOXYzine  (ATARAX ) tablet 25 mg  25 mg Oral Q6H PRN Liandra Mendia, MD       magnesium  hydroxide (MILK OF MAGNESIA) suspension 30 mL  30 mL Oral Daily PRN Nkwenti, Doris, NP       OLANZapine  zydis (ZYPREXA ) disintegrating tablet 5 mg  5 mg Oral TID PRN Tex Drilling, NP       pantoprazole  (PROTONIX ) EC tablet 40 mg  40 mg Oral Daily Nkwenti, Doris, NP   40 mg at 08/23/23 0911   polyethylene glycol (MIRALAX  / GLYCOLAX ) packet 17 g  17 g Oral Daily PRN Tex Drilling, NP       QUEtiapine  (SEROQUEL ) tablet 50 mg  50 mg Oral QHS Yomayra Tate, MD   50 mg at 08/22/23 2231   sertraline  (ZOLOFT ) tablet 100 mg  100 mg Oral Daily Makela Niehoff, MD    100 mg at 08/23/23 0911   Vitamin D  (Ergocalciferol ) (DRISDOL ) 1.25 MG (50000 UNIT) capsule 50,000 Units  50,000 Units Oral Q7 days Tex Drilling, NP   50,000 Units at 08/21/23 1150    Lab Results: No results found for this or any previous visit (from the past 48 hours).  Blood Alcohol level:  Lab Results  Component Value Date   [facility] <15 08/20/2023    Metabolic Disorder Labs: Lab Results  Component Value Date   HGBA1C 4.9 08/20/2023   MPG 93.93 08/20/2023   Lab Results  Component Value Date   PROLACTIN 5.7 08/12/2013   Lab Results  Component Value Date   CHOL 152 08/20/2023   TRIG 76 08/20/2023   HDL 68 08/20/2023   CHOLHDL 2.2 08/20/2023   VLDL 15 08/20/2023   LDLCALC 69 08/20/2023    Physical Findings: AIMS:  , ,  ,  ,    CIWA:    COWS:      Psychiatric Specialty Exam:  Presentation  General Appearance:  Appropriate for Environment; Casual  Eye Contact: Fair  Speech: Clear and Coherent  Speech Volume: Normal    Mood and Affect  Mood: Anxious; Depressed  Affect: Depressed; Flat   Thought Process  Thought Processes: Coherent  Descriptions of Associations:Intact  Orientation:Full (Time, Place and Person)  Thought Content:Illogical; Rumination; Perseveration; Obsessions  Hallucinations:No data recorded  Ideas of Reference:None  Suicidal Thoughts:No data recorded  Homicidal Thoughts:No data recorded   Sensorium  Memory: Immediate Fair; Recent Fair; Remote Fair  Judgment: Impaired  Insight: Shallow   Executive Functions  Concentration: Poor  Attention Span: Poor  Recall: [employer] of Knowledge: Fair  Language: Fair   Psychomotor Activity  Psychomotor Activity: No data recorded  Musculoskeletal: Strength & Muscle Tone: within normal limits Gait & Station: normal Assets  Assets: Manufacturing systems engineer; Desire for Improvement; Resilience    Physical Exam: Physical Exam Vitals and nursing  note  reviewed.  HENT:     Head: Normocephalic.  Cardiovascular:     Heart sounds: Normal heart sounds.  Neurological:     Mental Status: She is alert.    Review of Systems  Constitutional: Negative.   HENT: Negative.    Eyes: Negative.   Skin: Negative.    Blood pressure 111/71, pulse 74, temperature (!) 97.3 F (36.3 C), resp. rate 18, height 5' 3 (1.6 m), weight 61.7 kg, last menstrual period 02/18/2004, SpO2 97%. Body mass index is 24.09 kg/m.  Clinical decision making: Patient presenting to the inpatient unit with disabling anxiety and endorsing derealization episodes started 10 days ago.  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  Patient has been tapered off of her Klonopin  in the last few weeks after being on Klonopin  for 4 years which probably is making her anxiety worse and go through benzo withdrawal delirium her cognitive clouding.  Patient will be monitored closely and she needs ongoing hospitalization for further management of her symptoms Diagnosis: Principal Problem:   OCD (obsessive compulsive disorder)  Treatment Plan Summary:   Safety and Monitoring:             -- Voluntary admission to inpatient psychiatric unit for safety, stabilization and treatment             -- Daily contact with patient to assess and evaluate symptoms and progress in treatment             -- Patient's case to be discussed in multi-disciplinary team meeting             -- Observation Level: q15 minute checks             -- Vital signs:  q12 hours             -- Precautions: suicide, elopement, and assault   2. Psychiatric Diagnoses and Treatment:               Increased Zoloft  to 100 mg daily, Klonopin  increased to 0.5 mg BID, hydroxyzine  25 mg every 6 hours as needed anxiety. Seroquel  50 mg QHS   -- The risks/benefits/side-effects/alternatives to this medication were discussed in detail with the patient and time was given for questions. The patient consents  to medication trial.                -- Metabolic profile and EKG monitoring obtained while on an atypical antipsychotic (BMI: Lipid Panel: HbgA1c: QTc:)              -- Encouraged patient to participate in unit milieu and in scheduled group therapies                4. Discharge Planning:   -- Social work and case management to assist with discharge planning and identification of hospital follow-up needs prior to discharge  -- Estimated LOS: 3-4 days  Allyn Foil, MD 08/23/2023, 9:22 AM

## 2023-08-23 NOTE — Group Note (Signed)
 Date:  08/23/2023 Time:  8:32 PM  Group Topic/Focus:  Goals Group:   The focus of this group is to help patients establish daily goals to achieve during treatment and discuss how the patient can incorporate goal setting into their daily lives to aide in recovery.    Participation Level:  Active  Participation Quality:  Appropriate  Affect:  Appropriate  Cognitive:  Appropriate  Insight: Good  Engagement in Group:  Engaged  Modes of Intervention:  Discussion  Additional Comments:    [name] 08/23/2023, 8:32 PM

## 2023-08-23 NOTE — Group Note (Signed)
 [facility] LCSW Group Therapy Note   Group Date: 08/23/2023 Start Time: 1300 End Time: 1345   Type of Therapy/Topic:  Group Therapy:  Emotion Regulation  Participation Level:  Active   Mood:  Description of Group:    The purpose of this group is to assist patients in learning to regulate negative emotions and experience positive emotions. Patients will be guided to discuss ways in which they have been vulnerable to their negative emotions. These vulnerabilities will be juxtaposed with experiences of positive emotions or situations, and patients challenged to use positive emotions to combat negative ones. Special emphasis will be placed on coping with negative emotions in conflict situations, and patients will process healthy conflict resolution skills.  Therapeutic Goals: Patient will identify two positive emotions or experiences to reflect on in order to balance out negative emotions:  Patient will label two or more emotions that they find the most difficult to experience:  Patient will be able to demonstrate positive conflict resolution skills through discussion or role plays:   Summary of Patient Progress:   Pt was active an appropriate throughout group. Pt provided thoughtful insights to both facilitators and peers.     Therapeutic Modalities:   Cognitive Behavioral Therapy Feelings Identification Dialectical Behavioral Therapy   [name], LCSWA

## 2023-08-23 NOTE — BHH Counselor (Signed)
 Adult Comprehensive Assessment  Patient ID: [name], female   DOB: [birth year], 68 y.o.   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 Family Relationships: not at the Biomedical scientist / Lack of resources (include bankruptcy): not at the [employer] / Lack of housing: None reported Physical health (include injuries & life threatening diseases): just spaced out Social relationships: None reported Substance abuse: None reported Bereavement / Loss: Pt reports her therapist passed away  Living/Environment/Situation:  Living Arrangements: Alone Living conditions (as described by patient or guardian): Pt report she lives in an apartment but one day she woke up disassociated, reporting that nothing feels familiar anymore Who else lives in the home?: Pt reports she lives a lone How long has patient lived in current situation?: Pt reports for 12 years What is atmosphere in current home: Other (Comment) ( I don't know what happened all of a sudden I woke and nothing looked familiar)  Family History:  Marital status: Married Number of Years Married: 2 What types of issues is patient dealing with in the relationship?:  I think we were better as friends, than husban and wife Additional relationship information: None reported Are you sexually active?: No What is your sexual orientation?: heterosexual Has your sexual activity been affected by drugs, alcohol, medication, or emotional stress?: N/A Does patient have children?: Yes How many children?: 1 How is patient's  relationship with their children?: Pt reports she and her child have no contact, she reports,  I don't know, we had a very volatile relationship when she was growing up  Childhood History:  By whom was/is the patient raised?: Both parents Additional childhood history information: None reported Description of patient's relationship with caregiver when they were a child: Pt reports she and her mother had a symbiotic relationship. She reports they always fought but were also very close as well. Pt reports she wanted her father to be proud of her Patient's description of current relationship with people who raised him/her: Pt reports her mom passed 3 years ago and her father passed in 2017. Pt reports that when her father passed she found out he was a crossdresser but reports that did not affect their relationship How were you disciplined when you got in trouble as a child/adolescent?: Pt does not report Does patient have siblings?: Yes Number of Siblings: 3 Description of patient's current relationship with siblings: Pt reports she had 2 brothers and 1 sister. She reports one of her brother's died of a drug overdose and her sister died of an obesity related heart attack. Pt reports she has one living brother who lives in [place],  he calls and talks at me and I listen Did patient suffer any verbal/emotional/physical/sexual abuse as a child?: No Did patient suffer from severe childhood neglect?: No Was the patient ever a victim of a crime or a disaster?: No Witnessed domestic violence?: No Has patient been affected by domestic violence as an adult?: No  Education:  Highest grade of school patient has completed: 2 Master's Degrees, one in [place] and the other in [place] American Literature and [place] Currently a student?: No Learning disability?: No  Employment/Work Situation:   Employment Situation:  Employed Where is Patient Currently Employed?: 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 for 28 years Where was the Patient Employed at that Time?: [employer]. Has Patient ever Been in the [employer]?: No  Financial Resources:   Financial resources: Income from employment Does patient have a representative payee or guardian?: No  Alcohol/Substance Abuse:   What has been your use of drugs/alcohol within the last 12 months?: None reported If attempted suicide, did drugs/alcohol play a role in this?: No Alcohol/Substance Abuse Treatment Hx: Denies past history If yes, describe treatment: N/A Has alcohol/substance abuse ever caused legal problems?: No  Social Support System:   Conservation officer, nature Support System: Fair Describe Community Support System: My friend Nathanel Type of faith/religion: Jewish How does patient's faith help to cope with current illness?: Not since my mother died, we went to [place] every Friday together  Leisure/Recreation:   Do You Have Hobbies?: Yes Leisure and Hobbies: workout, got to the movies  Strengths/Needs:   What is the patient's perception of their strengths?: Working with people, nurturing them, growing with people, compassionate Patient states they can use these personal strengths during their treatment to contribute to their recovery: Pt does not report Patient states these barriers may affect/interfere with their treatment: None reported Patient states these barriers may affect their return to the community: None reported Other important information patient would like considered in  planning for their treatment: I just feel disconnected  Discharge Plan:   Currently receiving community mental health services: Yes (From Whom) (Pt reports Warren Shields and Elouise Rous at [employer] [facility]) Patient states concerns and preferences for aftercare planning are: Pt reports she wants to continue with her mental health providers Patient states they will know when they are safe and ready for discharge when: When I don't feel electric and I feel connected Does patient have access to transportation?: Yes Does patient have financial barriers related to discharge medications?: No Patient description of barriers related to discharge medications: N/A Will patient be returning to same living situation after discharge?: Yes  Summary/Recommendations:   Summary and Recommendations (to be completed by the evaluator): Patient is a 68 year old female from [place], [place] [facility] According to H&P pt presented voluntarily to [facility], 68 year old female accompanied by her friend [name]) reports for the past 10 days experience anxiety and desocialization, I just don't feel like I am home. History of dx anxiety, eating disorder, and OCD. Report her psychiatrist Dr. Elouise Rous with [facility] thinks it's her OCD thoughts. Therapist Warren Delaine and eating disorder dietitian. Report when wakes up with sweats and does not know where she it. During writer's encounter with patient, mood is depressed, & is extremely anxious, reports being unsure if I can go on and live like this .  Upon assessment pt reports that she had increasing anxiety for the last couple of weeks. She reports she talked with her psychiatrist about it and they feel it's her OCD. Pt reports that she feels differently and reports she woke up one morning  in her apartment, feeling as if it was not her apartment and she was not at home. Pt reports there is nothing that she can think of that preceded this feeling. Pt reports  a long hx of ED, reporting that her last hospitalization for ED was in 2019. Pt reports she has 1 living brother, and that they talk on the phone but are not very close. 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 Pt reports that her fear is that this feeling will last forever. Pt states multiple times throughout assessment that she feels dissassociated. Pt's primary diagnosis is OCD (obsessive compulsive disorder) (F42.9). Recommendations include: crisis stabilization, therapeutic milieu, encourage group attendance and participation, medication management for mood stabilization and development of comprehensive mental wellness/sobriety plan.  Lum [name]. 08/23/2023

## 2023-08-24 DIAGNOSIS — F422 Mixed obsessional thoughts and acts: Secondary | ICD-10-CM | POA: Diagnosis not present

## 2023-08-24 MED ORDER — RISPERIDONE 0.25 MG PO TABS
0.2500 mg | ORAL_TABLET | Freq: Every day | ORAL | Status: DC
  Filled 2023-08-24 (×2): qty 1

## 2023-08-24 MED ORDER — CLONAZEPAM 0.25 MG PO TBDP
0.2500 mg | ORAL_TABLET | Freq: Two times a day (BID) | ORAL | Status: DC
  Administered 2023-08-25 – 2023-08-30 (×11): 0.25 mg via ORAL
  Filled 2023-08-24 (×11): qty 1

## 2023-08-24 MED ORDER — CLONAZEPAM 0.25 MG PO TBDP: 0.2500 mg | ORAL_TABLET | Freq: Two times a day (BID) | ORAL | Status: DC

## 2023-08-24 NOTE — Progress Notes (Signed)
   08/24/23 2100  Psych Admission Type (Psych Patients Only)  Admission Status Voluntary  Psychosocial Assessment  Patient Complaints Anxiety  Eye Contact Fair  Facial Expression Anxious  Affect Appropriate to circumstance  Speech Logical/coherent  Interaction Assertive  Motor Activity Restless  Appearance/Hygiene In scrubs  Behavior Characteristics Appropriate to situation  Mood Pleasant  Thought Process  Coherency WDL  Content WDL  Delusions None reported or observed  Perception WDL  Hallucination None reported or observed  Judgment Impaired  Confusion Mild  Danger to Self  Current suicidal ideation? Denies  Danger to Others  Danger to Others None reported or observed

## 2023-08-24 NOTE — Group Note (Signed)
 Physical/Occupational Therapy Group Note  Group Topic: Pain Management and Coping   Group Date: 08/24/2023 Start Time: 1300 End Time: 1345 Facilitators: Clive Warren CROME, OT   Group Description:  Group discussed impact of chronic/acute pain on safety and independence with functional tasks and impact on mental health.  Identified and discussed any previously learned or implemented strategies used.  Discussed and reviewed cognitive behavioral pain coping strategies to address/improve overall management of pain. Discussed relaxation, distraction techniques, cognitive restructuring, activity pacing/energy conservation, environment/home safety modifications, and role of sleep and sleep hygiene. Allowed time for questions and further discussion.      Therapeutic Goal(s):   -  Identify and discuss previously utilized pain coping strategies and implications of pain on function/well-being -  Identify and discuss implementing new cognitive behavioral pain coping strategies into daily routines -  Demonstrate understanding and performance of learned cognitive behavioral pain coping strategies.   Individual Participation: Pt present for majority of session. 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  Politely excused herself prior to the progressive muscle relaxation activity, noting that her anxiety was too bad in that moment to relax and she needed to go walk to address it, demonstrating fair to good awareness of effective coping skills for her.   Participation Level: Moderate   Participation Quality: Independent   Behavior: Alert, Appropriate, Attentive , and Calm   Speech/Thought Process: Organized and Relevant    Affect/Mood: Appropriate and Anxious   Insight: Moderate and Good   Judgement: Good   Modes of Intervention: Clarification, Discussion, Education, Exploration, Problem-solving, Rapport Building, and Socialization  Patient Response to Interventions:  Disengaged, Engaged, and Receptive   Plan: Continue to engage patient in PT/OT groups 1 - 2x/week.  Terrion Gencarelli R., MPH, MS, OTR/L ascom [phone] 08/24/23, 3:48 PM

## 2023-08-24 NOTE — Progress Notes (Signed)
 Patient is a voluntary [facility] to [name] for [facility] and anxiety. Patient has been calm, cooperative with only complaint being constipation for which she received miralax  and later prune juice.  Patient denies SI, HI, AVH, and depression.  Rates her anxiety as a 12.  Interacts well with peers and staff and participated in all groups.  Will continue to monitor.

## 2023-08-24 NOTE — Progress Notes (Signed)
 [facility] MD Progress Note  08/24/2023 11:59 AM [name]  MRN:  [id number]  [name] 68 year old female accompanied by her friend [name]) reports for the past 10 days experience anxiety and desocialization, I just don't feel like I am home. History of dx anxiety, eating disorder, and OCD. Report her psychiatrist Dr. Elouise Rous with [facility] thinks it's her OCD thoughts. Therapist Warren Delaine and eating disorder dietitian. Report when wakes up with sweats and does not know where she it. During writer's encounter with patient, mood is depressed, & is extremely anxious, reports being unsure if I can go on and live like this . Patient is admitted to [facility] unit with Q15 min safety monitoring. Multidisciplinary team approach is offered. Medication management; group/milieu therapy is offered.   Subjective:  Chart reviewed, case discussed in multidisciplinary meeting, patient seen during rounds.   Discussed the plan to increase Zoloft  to 100 mg, increase Klonopin  to 0.5 mg twice daily.  Also discussed further plans of either increasing the Seroquel  to assist with the OCD or if patient remains significantly impaired by anxiety will replace Seroquel  with Risperdal  which is more evidence-based as an adjunct for OCD treatment.  Can also consider adding clomipramine eventually to help with the ongoing OCD symptoms.  Called:Information for Dr. Rous [phone] Call or Text-Dr. Waldemar had extensive discussion about the treatment plan and the struggles he had and implementing the treatment plan for OCD with the patient for a long time.  He made it very clear that patient will not be continued on any benzos and it has been finalized with both PCP and the psychiatrist.  He requested patient to be transitioned off of benzos and replace Seroquel  with Risperdal  I will as he has been having this discussions multiple times with the patient to help with the OCD.  He also informed that he has looked  into for residential facilities for OCD where patient is recommended to but patient has been not following any of the treatment plan and remains fixated on the benzodiazepines for anxiety and OCD.    [email or web address]   Today on interview patient was updated on the conversation this provider had with her outpatient provider.  Patient was informed that the outpatient provider has made it very clear that when she gets discharged from this hospital the plan of discontinuing the benzos remains at.  Provider discussed the plan of reducing her Klonopin  to 0.25 twice daily and also the plan that she will not be receiving any benzos on discharge or at least not more than 7 days as outpatient provider will not be continuing it.  Provider educated on Risperdal  to be added in the place of Seroquel  as per the outpatient doctor request and prior conversations of this provider to help with the treatment resistant OCD.  Her intrusive thoughts are mainly about her anxiety and derealization sensation but patient is not noted to be responding to any of the derealization sensations even when she is seen by herself or when she is with the crowd.  Patient requested to have the Seroquel  tonight and to try the Risperdal  tomorrow night.  She denies SI/HI/plan.  She continues to report that she feels the derealization but is unable to give any details and is not displaying any functional impairment on the unit because of that.  Provider discussed at length about the residential facilities for OCD that her outpatient provider has already sorted out for her.  We reached out to the clinic through  email and the psychiatrist has sent down the list of the residential facilities that he has worked for her.  Sleep: Fair  Appetite:  Fair  Past Psychiatric History: see h&P Family History:  Family History  Problem Relation Age of Onset   Polymyalgia rheumatica Mother    Osteoporosis Mother    Cancer Father    Heart  disease Father        CHF   Diabetes Sister        Obesity   Stomach cancer Maternal Grandfather    Colon cancer Neg Hx    Esophageal cancer Neg Hx    Rectal cancer Neg Hx    Social History:  Social History   Substance and Sexual Activity  Alcohol Use No     Social History   Substance and Sexual Activity  Drug Use No    Social History   Socioeconomic History   Marital status: Divorced    Spouse name: Not on file   Number of children: 1   Years of education: Not on file   Highest education level: Not on file  Occupational History   Occupation: Scientist, product/process development education    Employer: [employer]   Occupation: Consulting civil engineer   Occupation: Restaurant manager, fast food   Occupation: Architect    Comment: for person's with Parkinson's Disease  Tobacco Use   Smoking status: Never   Smokeless tobacco: Never  Vaping Use   Vaping status: Never Used  Substance and Sexual Activity   Alcohol use: No   Drug use: No   Sexual activity: Not on file  Other Topics Concern   Not on file  Social History Narrative   ** Merged History Encounter **       Social Drivers of Health   Financial Resource Strain: Low Risk  (04/19/2023)   Received from [facility]   Overall Financial Resource Strain (CARDIA)    Difficulty of Paying Living Expenses: Not hard at all  Food Insecurity: No Food Insecurity (08/20/2023)   Hunger Vital Sign    Worried About Running Out of Food in the Last Year: Never true    Ran Out of Food in the Last Year: Never true  Transportation Needs: No Transportation Needs (08/20/2023)   PRAPARE - Administrator, Civil Service (Medical): No    Lack of Transportation (Non-Medical): No  Physical Activity: Sufficiently Active (03/12/2023)   Received from [facility]   Exercise Vital Sign    On average, how many days per week do you engage in moderate to strenuous exercise (like a brisk walk)?: 7 days    On average, how many minutes do you engage in exercise at  this level?: 60 min  Stress: Stress Concern Present (03/12/2023)   Received from [facility] of Occupational Health - Occupational Stress Questionnaire    Feeling of Stress : Very much  Social Connections: Unknown (08/21/2023)   Social Connection and Isolation Panel    Frequency of Communication with Friends and Family: Three times a week    Frequency of Social Gatherings with Friends and Family: Once a week    Attends Religious Services: 1 to 4 times per year    Active Member of [employer] or Organizations: Yes    Attends Banker Meetings: 1 to 4 times per year    Marital Status: Patient declined   Past Medical History:  Past Medical History:  Diagnosis Date   Acute pain of right knee 03/07/2017  Anemia    Anorexia    Anxiety    Bitemporal hemianopsia    Borderline personality disorder (HCC)    Bulimia    Depression    Hyperlipidemia    Ocular migraine    Osteoporosis    Pigmentary retinal dystrophy    Retinitis pigmentosa    TBI (traumatic brain injury) (HCC)     Past Surgical History:  Procedure Laterality Date   CESAREAN SECTION      Current Medications: Current Facility-Administered Medications  Medication Dose Route Frequency Provider Last Rate Last Admin   acetaminophen  (TYLENOL ) tablet 650 mg  650 mg Oral Q6H PRN Nkwenti, Doris, NP       alum & mag hydroxide-simeth (MAALOX/MYLANTA) 200-200-20 MG/5ML suspension 30 mL  30 mL Oral Q4H PRN Nkwenti, Doris, NP       clonazePAM  (KLONOPIN ) tablet 0.5 mg  0.5 mg Oral BID Nalanie Winiecki, MD   0.5 mg at 08/24/23 0931   hydrOXYzine  (ATARAX ) tablet 50 mg  50 mg Oral Q6H PRN Breionna Punt, MD   50 mg at 08/23/23 1815   magnesium  hydroxide (MILK OF MAGNESIA) suspension 30 mL  30 mL Oral Daily PRN Tex Drilling, NP       OLANZapine  zydis (ZYPREXA ) disintegrating tablet 5 mg  5 mg Oral TID PRN Tex Drilling, NP       pantoprazole  (PROTONIX ) EC tablet 40 mg  40 mg Oral Daily Nkwenti, Doris, NP   40  mg at 08/24/23 0925   polyethylene glycol (MIRALAX  / GLYCOLAX ) packet 17 g  17 g Oral Daily PRN Tex Drilling, NP   17 g at 08/24/23 9074   QUEtiapine  (SEROQUEL ) tablet 50 mg  50 mg Oral QHS Dartha Rozzell, MD   50 mg at 08/23/23 2236   rosuvastatin  (CRESTOR ) tablet 20 mg  20 mg Oral Daily Merion Grimaldo, MD   20 mg at 08/24/23 9073   sertraline  (ZOLOFT ) tablet 100 mg  100 mg Oral Daily Wyllow Seigler, MD   100 mg at 08/24/23 9074   Vitamin D  (Ergocalciferol ) (DRISDOL ) 1.25 MG (50000 UNIT) capsule 50,000 Units  50,000 Units Oral Q7 days Tex Drilling, NP   50,000 Units at 08/21/23 1150    Lab Results: No results found for this or any previous visit (from the past 48 hours).  Blood Alcohol level:  Lab Results  Component Value Date   [facility] <15 08/20/2023    Metabolic Disorder Labs: Lab Results  Component Value Date   HGBA1C 4.9 08/20/2023   MPG 93.93 08/20/2023   Lab Results  Component Value Date   PROLACTIN 5.7 08/12/2013   Lab Results  Component Value Date   CHOL 152 08/20/2023   TRIG 76 08/20/2023   HDL 68 08/20/2023   CHOLHDL 2.2 08/20/2023   VLDL 15 08/20/2023   LDLCALC 69 08/20/2023    Physical Findings: AIMS:  , ,  ,  ,    CIWA:    COWS:      Psychiatric Specialty Exam:  Presentation  General Appearance:  Appropriate for Environment; Casual  Eye Contact: Fair  Speech: Clear and Coherent  Speech Volume: Normal    Mood and Affect  Mood: Anxious; Depressed  Affect: Depressed; Flat   Thought Process  Thought Processes: Coherent  Descriptions of Associations:Intact  Orientation:Full (Time, Place and Person)  Thought Content:Illogical; Rumination; Perseveration; Obsessions  Hallucinations: Denies  Ideas of Reference:None  Suicidal Thoughts: Denies  Homicidal Thoughts: Denies   Sensorium  Memory: Immediate Fair; Recent Fair; Remote Fair  Judgment: Impaired  Insight: Shallow   Executive Functions   Concentration: Poor  Attention Span: Poor  Recall: [employer] of Knowledge: Fair  Language: Fair   Psychomotor Activity  Psychomotor Activity: No data recorded  Musculoskeletal: Strength & Muscle Tone: within normal limits Gait & Station: normal Assets  Assets: Manufacturing systems engineer; Desire for Improvement; Resilience    Physical Exam: Physical Exam Vitals and nursing note reviewed.  HENT:     Head: Normocephalic.  Cardiovascular:     Heart sounds: Normal heart sounds.  Neurological:     Mental Status: She is alert.    Review of Systems  Constitutional: Negative.   HENT: Negative.    Eyes: Negative.   Skin: Negative.    Blood pressure 114/73, pulse 71, temperature 98.2 F (36.8 C), resp. rate 16, height 5' 3 (1.6 m), weight 61.7 kg, last menstrual period 02/18/2004, SpO2 97%. Body mass index is 24.09 kg/m.  Clinical decision making: Patient presenting to the inpatient unit with disabling anxiety and endorsing derealization episodes started 10 days ago.  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  Patient has been tapered off of her Klonopin  in the last few weeks after being on Klonopin  for 4 years which probably is making her anxiety worse and go through benzo withdrawal delirium her cognitive clouding.  Patient will be monitored closely and she needs ongoing hospitalization for further management of her symptoms Diagnosis: Principal Problem:   OCD (obsessive compulsive disorder)  Treatment Plan Summary:   Safety and Monitoring:             -- Voluntary admission to inpatient psychiatric unit for safety, stabilization and treatment             -- Daily contact with patient to assess and evaluate symptoms and progress in treatment             -- Patient's case to be discussed in multi-disciplinary team meeting             -- Observation Level: q15 minute checks             -- Vital signs:  q12 hours             --  Precautions: suicide, elopement, and assault   2. Psychiatric Diagnoses and Treatment:                Zoloft  to 100 mg daily, Klonopin  increased to 0.5 mg BID, hydroxyzine  25 mg every 6 hours as needed anxiety. Seroquel  50 mg QHS will be discontinued on 08/25/2023 and initiated on Risperdal  0.25 nightly as patient is very anxious about this transition and wants to start on the lowest dose possible.     -- The risks/benefits/side-effects/alternatives to this medication were discussed in detail with the patient and time was given for questions. The patient consents to medication trial.                -- Metabolic profile and EKG monitoring obtained while on an atypical antipsychotic (BMI: Lipid Panel: HbgA1c: QTc:)              -- Encouraged patient to participate in unit milieu and in scheduled group therapies                4. Discharge Planning:   -- Social work and case management to assist with discharge planning and identification of hospital follow-up needs prior to discharge  -- Estimated LOS: 3-4 days  Allyn Foil, MD 08/24/2023, 11:59 AM

## 2023-08-24 NOTE — Group Note (Signed)
 Date:  08/24/2023 Time:  11:03 AM  Group Topic/Focus:  Fresh air Therapy with music and conversation.    Participation Level:  Active  Participation Quality:  Appropriate  Affect:  Appropriate  Cognitive:  Appropriate  Insight: Appropriate  Engagement in Group:  Engaged  Modes of Intervention:  Socialization and music  Additional Comments:  none  Norleen SHAUNNA Bias 08/24/2023, 11:03 AM

## 2023-08-24 NOTE — Plan of Care (Signed)
  Problem: Coping: Goal: Ability to identify and develop effective coping behavior will improve Outcome: Progressing   Problem: Self-Concept: Goal: Ability to identify factors that promote anxiety will improve Outcome: Progressing Goal: Level of anxiety will decrease Outcome: Progressing   

## 2023-08-24 NOTE — [facility] Counselor (Signed)
 CSW sent an email per provider's request to asking pt's outpatient provider for resource list for substance use programs per provider's request.   Lum Croft, MSW, [facility] 08/24/2023 3:27 PM

## 2023-08-24 NOTE — [facility] Counselor (Signed)
 CSW spoke to pt's care manager from [place], who reports she was recently assigned to pt's case.   CSW took down [name] direct line to give to pt should pt want to make contact with her care manager.   CSW provided information to pt. Pt seemed agreeable to making contact before leaving the hospital.   Lum Croft, MSW, [facility] 08/24/2023 11:06 AM

## 2023-08-24 NOTE — Group Note (Signed)
 Date:  08/24/2023 Time:  4:15 PM  Group Topic/Focus:  Bingo Dayroom Activity   This group allows patients to come together and play bingo and also get to know one another while winning small prizes and rewards when they won bingo     Participation Level:  Active  Participation Quality:  Appropriate  Affect:  Appropriate  Cognitive:  Appropriate  Insight: Appropriate  Engagement in Group:  Engaged  Modes of Intervention:  Activity  Additional Comments:    [name] 08/24/2023, 4:15 PM

## 2023-08-25 MED ORDER — RISPERIDONE 1 MG PO TABS
0.5000 mg | ORAL_TABLET | Freq: Every day | ORAL | Status: DC
  Administered 2023-08-25 – 2023-08-26 (×2): 0.5 mg via ORAL
  Filled 2023-08-25 (×2): qty 1

## 2023-08-25 NOTE — Progress Notes (Signed)
 Pt interjects herself in conversations between nursing staff and other pts on this unit, Geropsych.  When ignored she gets louder and more persistent.

## 2023-08-25 NOTE — Plan of Care (Signed)

## 2023-08-25 NOTE — Group Note (Signed)
 Date:  08/25/2023 Time:  8:32 PM  Group Topic/Focus:  Self Care:   The focus of this group is to help patients understand the importance of self-care in order to improve or restore emotional, physical, spiritual, interpersonal, and financial health.    Participation Level:  Active  Participation Quality:  Appropriate  Affect:  Appropriate  Cognitive:  Appropriate  Insight: Appropriate  Engagement in Group:  Engaged  Modes of Intervention:  Education  Additional Comments:    Laymon ONEIDA Finder 08/25/2023, 8:32 PM

## 2023-08-25 NOTE — Progress Notes (Signed)
   08/25/23 2100  Psych Admission Type (Psych Patients Only)  Admission Status Voluntary  Psychosocial Assessment  Patient Complaints Anxiety  Eye Contact Fair  Facial Expression Anxious  Affect Appropriate to circumstance  Speech Logical/coherent  Interaction Assertive  Motor Activity Restless  Appearance/Hygiene In scrubs  Behavior Characteristics Appropriate to situation  Mood Pleasant  Aggressive Behavior  Effect No apparent injury  Thought Process  Coherency WDL  Content WDL  Delusions None reported or observed  Perception WDL  Hallucination None reported or observed  Judgment Impaired  Confusion Mild  Danger to Self  Current suicidal ideation? Denies  Danger to Others  Danger to Others None reported or observed

## 2023-08-25 NOTE — [facility] Suicide Risk Assessment (Signed)
 [facility] INPATIENT:  Family/Significant Other Suicide Prevention Education  Suicide Prevention Education:  Contact Attempts: Annelyse Rey brother [phone] and Nathanel Gallon friend [phone], (name of family member/significant other) has been identified by the patient as the family member/significant other with whom the patient will be residing, and identified as the person(s) who will aid the patient in the event of a mental health crisis.  With written consent from the patient, two attempts were made to provide suicide prevention education, prior to and/or following the patient's discharge.  We were unsuccessful in providing suicide prevention education.  A suicide education pamphlet was given to the patient to share with family/significant other.  Date and time of first attempt:08/25/2023/12:00pm Date and time of second attempt: Pamila Nine 08/25/2023, 11:59 AM

## 2023-08-25 NOTE — Progress Notes (Signed)
   08/25/23 1100  Psych Admission Type (Psych Patients Only)  Admission Status Voluntary  Psychosocial Assessment  Patient Complaints Anxiety  Eye Contact Fair  Facial Expression Anxious  Affect Appropriate to circumstance  Speech Logical/coherent  Interaction Assertive  Motor Activity Restless  Appearance/Hygiene In scrubs  Behavior Characteristics Appropriate to situation  Mood Pleasant  Aggressive Behavior  Effect No apparent injury  Thought Process  Coherency WDL  Content WDL  Delusions None reported or observed  Perception WDL  Hallucination None reported or observed  Judgment Impaired  Confusion Mild  Danger to Self  Current suicidal ideation? Denies  Danger to Others  Danger to Others None reported or observed

## 2023-08-25 NOTE — Plan of Care (Signed)
  Problem: Education: Goal: Ability to state activities that reduce stress will improve Outcome: Progressing   Problem: Coping: Goal: Ability to identify and develop effective coping behavior will improve Outcome: Progressing   Problem: Self-Concept: Goal: Ability to identify factors that promote anxiety will improve Outcome: Progressing Goal: Level of anxiety will decrease Outcome: Progressing Goal: Ability to modify response to factors that promote anxiety will improve Outcome: Progressing   Problem: Education: Goal: Knowledge of [facility] Education information/materials will improve Outcome: Progressing Goal: Emotional status will improve Outcome: Progressing Goal: Mental status will improve Outcome: Progressing Goal: Verbalization of understanding the information provided will improve Outcome: Progressing   Problem: Activity: Goal: Interest or engagement in activities will improve Outcome: Progressing Goal: Sleeping patterns will improve Outcome: Progressing   Problem: Coping: Goal: Ability to verbalize frustrations and anger appropriately will improve Outcome: Progressing Goal: Ability to demonstrate self-control will improve Outcome: Progressing   Problem: Health Behavior/Discharge Planning: Goal: Identification of resources available to assist in meeting health care needs will improve Outcome: Progressing Goal: Compliance with treatment plan for underlying cause of condition will improve Outcome: Progressing

## 2023-08-25 NOTE — Progress Notes (Signed)
 [facility] MD Progress Note  08/25/2023 1:53 PM [name]  MRN:  [id number]  [name] 68 year old female accompanied by her friend [name]) reports for the past 10 days experience anxiety and desocialization, I just don't feel like I am home. History of dx anxiety, eating disorder, and OCD. Report her psychiatrist Dr. Elouise Rous with [facility] thinks it's her OCD thoughts. Therapist Warren Delaine and eating disorder dietitian. Report when wakes up with sweats and does not know where she it. During writer's encounter with patient, mood is depressed, & is extremely anxious, reports being unsure if I can go on and live like this . Patient is admitted to [facility] unit with Q15 min safety monitoring. Multidisciplinary team approach is offered. Medication management; group/milieu therapy is offered.   Subjective:  Chart reviewed, case discussed in multidisciplinary meeting, patient seen during rounds. Patient is seen sitting in the dayroom and she ambulates with provider to discuss her treatment plan and current symptoms. She continues to be very anxious and reports that she is scared that she will never return to her life as she knew it. Continues to endorse derealization however there continues to be no impairment of her abilities. Intrussive thoughts continue and unchanged. Endorses anxiety symptoms with increased nausea and feeling as though she needs to vomit. Patient continues to be agreeable to changing to risperidone  tonight and titrate dose as tolerated and increase sertraline  as well. Continues to contemplate transition to residential facility after discharge. She slept well and has been eating well. Does stationery exercises in the day room.     Sleep: Good  Appetite:  Good  Past Psychiatric History: see h&P Family History:  Family History  Problem Relation Age of Onset   Polymyalgia rheumatica Mother    Osteoporosis Mother    Cancer Father    Heart disease Father         CHF   Diabetes Sister        Obesity   Stomach cancer Maternal Grandfather    Colon cancer Neg Hx    Esophageal cancer Neg Hx    Rectal cancer Neg Hx    Social History:  Social History   Substance and Sexual Activity  Alcohol Use No     Social History   Substance and Sexual Activity  Drug Use No    Social History   Socioeconomic History   Marital status: Divorced    Spouse name: Not on file   Number of children: 1   Years of education: Not on file   Highest education level: Not on file  Occupational History   Occupation: Scientist, product/process development education    Employer: [employer]   Occupation: Consulting civil engineer   Occupation: Restaurant manager, fast food   Occupation: Architect    Comment: for person's with Parkinson's Disease  Tobacco Use   Smoking status: Never   Smokeless tobacco: Never  Vaping Use   Vaping status: Never Used  Substance and Sexual Activity   Alcohol use: No   Drug use: No   Sexual activity: Not on file  Other Topics Concern   Not on file  Social History Narrative   ** Merged History Encounter **       Social Drivers of Health   Financial Resource Strain: Low Risk  (04/19/2023)   Received from [facility]   Overall Financial Resource Strain (CARDIA)    Difficulty of Paying Living Expenses: Not hard at all  Food Insecurity: No Food Insecurity (08/20/2023)   Hunger Vital  Sign    Worried About Programme researcher, broadcasting/film/video in the Last Year: Never true    Ran Out of Food in the Last Year: Never true  Transportation Needs: No Transportation Needs (08/20/2023)   PRAPARE - Administrator, Civil Service (Medical): No    Lack of Transportation (Non-Medical): No  Physical Activity: Sufficiently Active (03/12/2023)   Received [facility]   Exercise Vital Sign    On average, how many days per week do you engage in moderate to strenuous exercise (like a brisk walk)?: 7 days    On average, how many minutes do you engage in exercise at this level?: 60 min   Stress: Stress Concern Present (03/12/2023)   Received from [facility] - Occupational Stress Questionnaire    Feeling of Stress : Very much  Social Connections: Unknown (08/21/2023)   Social Connection and Isolation Panel    Frequency of Communication with Friends and Family: Three times a week    Frequency of Social Gatherings with Friends and Family: Once a week    Attends Religious Services: 1 to 4 times per year    Active Member of [employer] or Organizations: Yes    Attends Banker Meetings: 1 to 4 times per year    Marital Status: Patient declined   Past Medical History:  Past Medical History:  Diagnosis Date   Acute pain of right knee 03/07/2017   Anemia    Anorexia    Anxiety    Bitemporal hemianopsia    Borderline personality disorder (HCC)    Bulimia    Depression    Hyperlipidemia    Ocular migraine    Osteoporosis    Pigmentary retinal dystrophy    Retinitis pigmentosa    TBI (traumatic brain injury) (HCC)     Past Surgical History:  Procedure Laterality Date   CESAREAN SECTION      Current Medications: Current Facility-Administered Medications  Medication Dose Route Frequency Provider Last Rate Last Admin   acetaminophen  (TYLENOL ) tablet 650 mg  650 mg Oral Q6H PRN Nkwenti, Doris, NP       alum & mag hydroxide-simeth (MAALOX/MYLANTA) 200-200-20 MG/5ML suspension 30 mL  30 mL Oral Q4H PRN Nkwenti, Doris, NP       clonazePAM  (KLONOPIN ) disintegrating tablet 0.25 mg  0.25 mg Oral BID Dail Rankin RAMAN, RPH   0.25 mg at 08/25/23 9096   hydrOXYzine  (ATARAX ) tablet 50 mg  50 mg Oral Q6H PRN Jadapalle, Sree, MD   50 mg at 08/23/23 1815   magnesium  hydroxide (MILK OF MAGNESIA) suspension 30 mL  30 mL Oral Daily PRN Tex Drilling, NP       OLANZapine  zydis (ZYPREXA ) disintegrating tablet 5 mg  5 mg Oral TID PRN Tex Drilling, NP       pantoprazole  (PROTONIX ) EC tablet 40 mg  40 mg Oral Daily Nkwenti, Doris, NP   40  mg at 08/25/23 0903   polyethylene glycol (MIRALAX  / GLYCOLAX ) packet 17 g  17 g Oral Daily PRN Tex Drilling, NP   17 g at 08/25/23 0902   risperiDONE  (RISPERDAL ) tablet 0.25 mg  0.25 mg Oral QHS Jadapalle, Sree, MD       rosuvastatin  (CRESTOR ) tablet 20 mg  20 mg Oral Daily Jadapalle, Sree, MD   20 mg at 08/25/23 0903   sertraline  (ZOLOFT ) tablet 100 mg  100 mg Oral Daily Jadapalle, Sree, MD   100 mg at 08/25/23 [id number]  Vitamin D  (Ergocalciferol ) (DRISDOL ) 1.25 MG (50000 UNIT) capsule 50,000 Units  50,000 Units Oral Q7 days Tex Drilling, NP   50,000 Units at 08/21/23 1150    Lab Results: No results found for this or any previous visit (from the past 48 hours).  Blood Alcohol level:  Lab Results  Component Value Date   [facility] <15 08/20/2023    Metabolic Disorder Labs: Lab Results  Component Value Date   HGBA1C 4.9 08/20/2023   MPG 93.93 08/20/2023   Lab Results  Component Value Date   PROLACTIN 5.7 08/12/2013   Lab Results  Component Value Date   CHOL 152 08/20/2023   TRIG 76 08/20/2023   HDL 68 08/20/2023   CHOLHDL 2.2 08/20/2023   VLDL 15 08/20/2023   LDLCALC 69 08/20/2023    Physical Findings: AIMS:  , ,  ,  ,    CIWA:    COWS:      Psychiatric Specialty Exam:  Presentation  General Appearance:  Appropriate for Environment  Eye Contact: Good  Speech: Normal Rate  Speech Volume: Normal    Mood and Affect  Mood: Anxious  Affect: Tearful   Thought Process  Thought Processes: Coherent  Descriptions of Associations:Intact  Orientation:Full (Time, Place and Person)  Thought Content:Logical  Hallucinations:Hallucinations: None  Ideas of Reference:None  Suicidal Thoughts:Suicidal Thoughts: No  Homicidal Thoughts:Homicidal Thoughts: No   Sensorium  Memory: Immediate Good; Recent Good; Remote Good  Judgment: Good  Insight: Fair   Art therapist  Concentration: Good  Attention Span: Good  Recall: Good  Fund of  Knowledge: Good  Language: Good   Psychomotor Activity  Psychomotor Activity: Psychomotor Activity: Normal  Musculoskeletal: Strength & Muscle Tone: within normal limits Gait & Station: normal Assets  Assets: Desire for Improvement; Physical Health    Physical Exam: Physical Exam ROS Blood pressure 106/66, pulse 63, temperature 97.9 F (36.6 C), resp. rate 16, height 5' 3 (1.6 m), weight 61.7 kg, last menstrual period 02/18/2004, SpO2 98%. Body mass index is 24.09 kg/m.  Diagnosis: Principal Problem:   OCD (obsessive compulsive disorder)   PLAN: Safety and Monitoring:  -- Voluntary admission to inpatient psychiatric unit for safety, stabilization and treatment  -- Daily contact with patient to assess and evaluate symptoms and progress in treatment  -- Patient's case to be discussed in multi-disciplinary team meeting  -- Observation Level : q15 minute checks  -- Vital signs:  q12 hours  -- Precautions: suicide, elopement, and assault -- Encouraged patient to participate in unit milieu and in scheduled group therapies  2. Psychiatric Diagnoses and Treatment:  Will continue sertraline  200 mg. Order for risperidone  has been entered. Will add order for hydroxyzine  50 mg at bedtime if needed to assist with sleep. Will continue with current dose of clonazepam  and continue taper as discussed.        3. Medical Issues Being Addressed:  None   4. Discharge Planning:   -- Social work and case management to assist with discharge planning and identification of hospital follow-up needs prior to discharge  -- Estimated LOS: 3-4 days  Daine KATHEE Ober, NP 08/25/2023, 1:53 PM

## 2023-08-25 NOTE — Group Note (Signed)
 Date:  08/25/2023 Time:  10:47 AM  Group Topic/Focus:  Fresh air Therapy wirh music and conversation    Participation Level:  Active  Participation Quality:  Appropriate  Affect:  Appropriate  Cognitive:  Appropriate  Insight: Appropriate  Engagement in Group:  Engaged  Modes of Intervention:  Socialization and music  Additional Comments:  none   Norleen SHAUNNA Bias 08/25/2023, 10:47 AM

## 2023-08-26 NOTE — Group Note (Signed)
 LCSW Group Therapy Note  Group Date: 08/26/2023 Start Time: 1300 End Time: 1400   Type of Therapy and Topic:  Group Therapy - Healthy vs Unhealthy Coping Skills  Participation Level:  Active   Description of Group The focus of this group was to determine what unhealthy coping techniques typically are used by group members and what healthy coping techniques would be helpful in coping with various problems. Patients were guided in becoming aware of the differences between healthy and unhealthy coping techniques. Patients were asked to identify 2-3 healthy coping skills they would like to learn to use more effectively.  Therapeutic Goals Patients learned that coping is what human beings do all day long to deal with various situations in their lives Patients defined and discussed healthy vs unhealthy coping techniques Patients identified their preferred coping techniques and identified whether these were healthy or unhealthy Patients determined 2-3 healthy coping skills they would like to become more familiar with and use more often. Patients provided support and ideas to each other   Summary of Patient Progress:  Patient proved open to input from peers and feedback from CSW. Patient demonstrated positive insight into the subject matter, was respectful of peers, and participated throughout the entire session.   Therapeutic Modalities Cognitive Behavioral Therapy Motivational Interviewing  Aldo CHRISTELLA Niece, [place] 08/26/2023  2:09 PM

## 2023-08-26 NOTE — Group Note (Signed)
 Date:  08/26/2023 Time:  10:03 PM  Group Topic/Focus:  Wrap-Up Group:   The focus of this group is to help patients review their daily goal of treatment and discuss progress on daily workbooks.    Participation Level:  Active  Participation Quality:  Appropriate  Affect:  Appropriate  Cognitive:  Appropriate  Insight: Good  Engagement in Group:  Engaged  Modes of Intervention:  Discussion  Additional Comments:    Tommas CHRISTELLA Bunker 08/26/2023, 10:03 PM

## 2023-08-26 NOTE — Plan of Care (Signed)

## 2023-08-26 NOTE — [facility] Suicide Risk Assessment (Signed)
 [facility] INPATIENT:  Family/Significant Other Suicide Prevention Education  Suicide Prevention Education:  Contact Attempts: Raine Blodgett, patient's brother, [phone], (name of family member/significant other) has been identified by the patient as the family member/significant other with whom the patient will be residing, and identified as the person(s) who will aid the patient in the event of a mental health crisis.  With written consent from the patient, two attempts were made to provide suicide prevention education, prior to and/or following the patient's discharge.  We were unsuccessful in providing suicide prevention education.  A suicide education pamphlet was given to the patient to share with family/significant other.  Date and time of first attempt:08/25/2023 Date and time of second attempt:08/26/2023/3:32pm   CSW was unable to complete SPE with patient's brother as he is requesting to speak with a physician.    Aldo HERO Lalah Durango, LCSW 08/26/2023, 3:55 PM

## 2023-08-26 NOTE — Group Note (Signed)
 Date:  08/26/2023 Time:  11:49 AM  Group Topic/Focus:  Self Esteem Action Plan:   The focus of this group is to help patients create a plan to continue to build self-esteem after discharge.    Participation Level:  Active  Participation Quality:  Appropriate  Affect:  Appropriate  Cognitive:  Alert  Insight: Appropriate  Engagement in Group:  Engaged  Modes of Intervention:  Discussion  Additional Comments:  N/A  Harlene LITTIE Gavel 08/26/2023, 11:49 AM

## 2023-08-26 NOTE — Progress Notes (Signed)
 [facility] MD Progress Note  08/26/2023 12:39 PM [name]  MRN:  [id number]  [name] 68 year old female accompanied by [name]) reports for the past 10 days experience anxiety and desocialization, I just don't feel like I am home. History of dx anxiety, eating disorder, and OCD. Report her psychiatrist Dr. Elouise Rous with [facility] thinks it's her OCD thoughts. Therapist Warren Delaine and eating disorder dietitian. Report when wakes up with sweats and does not know where she it. During writer's encounter with patient, mood is depressed, & is extremely anxious, reports being unsure if I can go on and live like this . Patient is admitted to [facility] unit with Q15 min safety monitoring. Multidisciplinary team approach is offered. Medication management; group/milieu therapy is offered.   Subjective:  Chart reviewed, case discussed in multidisciplinary meeting, patient seen during rounds. Patient is standing in the hall near the nursing station and is very restless. She reports that she did not sleep well 'I didn't get good sleep like the the seroquel . She received a PRN dose of hydroxyzine  that she does not feel as though improved her sleep or anxiety last night. During assessment, she is very restless and tearful regarding life as I knew it, I have people that need me.  Continues to endorse derealization however there continues to be no impairment of her abilities. As she recounts history of her mother's mental illness she continues to say that she is disassociating and there is no evidence of this and she cannot elaborate on her symptoms. Intrussive thoughts continue and unchanged. Has been making lists of all of my patients to make sure that I know they are real. \Continues to contemplate transition to residential facility after discharge. She did not sleep well and has did not eat well per her accounts. Does stationery exercises in the day room and incorporates her  peers.  Denies SI, HI, AVH at this time.     Sleep: Good  Appetite:  Good  Past Psychiatric History: see h&P Family History:  Family History  Problem Relation Age of Onset   Polymyalgia rheumatica Mother    Osteoporosis Mother    Cancer Father    Heart disease Father        CHF   Diabetes Sister        Obesity   Stomach cancer Maternal Grandfather    Colon cancer Neg Hx    Esophageal cancer Neg Hx    Rectal cancer Neg Hx    Social History:  Social History   Substance and Sexual Activity  Alcohol Use No     Social History   Substance and Sexual Activity  Drug Use No    Social History   Socioeconomic History   Marital status: Divorced    Spouse name: Not on file   Number of children: 1   Years of education: Not on file   Highest education level: Not on file  Occupational History   Occupation: Scientist, product/process development education    Employer: [employer]   Occupation: Consulting civil engineer   Occupation: Restaurant manager, fast food   Occupation: Architect    Comment: for person's with Parkinson's Disease  Tobacco Use   Smoking status: Never   Smokeless tobacco: Never  Vaping Use   Vaping status: Never Used  Substance and Sexual Activity   Alcohol use: No   Drug use: No   Sexual activity: Not on file  Other Topics Concern   Not on file  Social History Narrative   **  Merged History Encounter **       Social Drivers of Health   Financial Resource Strain: Low Risk  (04/19/2023)   Received from [facility]   Overall Financial Resource Strain (CARDIA)    Difficulty of Paying Living Expenses: Not hard at all  Food Insecurity: No Food Insecurity (08/20/2023)   Hunger Vital Sign    Worried About Running Out of Food in the Last Year: Never true    Ran Out of Food in the Last Year: Never true  Transportation Needs: No Transportation Needs (08/20/2023)   PRAPARE - Administrator, Civil Service (Medical): No    Lack of Transportation (Non-Medical): No  Physical Activity:  Sufficiently Active (03/12/2023)   Received [facility]   Exercise Vital Sign    On average, how many days per week do you engage in moderate to strenuous exercise (like a brisk walk)?: 7 days    On average, how many minutes do you engage in exercise at this level?: 60 min  Stress: Stress Concern Present (03/12/2023)   Received from [facility] - Occupational Stress Questionnaire    Feeling of Stress : Very much  Social Connections: Unknown (08/21/2023)   Social Connection and Isolation Panel    Frequency of Communication with Friends and Family: Three times a week    Frequency of Social Gatherings with Friends and Family: Once a week    Attends Religious Services: 1 to 4 times per year    Active Member of [employer] or Organizations: Yes    Attends Banker Meetings: 1 to 4 times per year    Marital Status: Patient declined   Past Medical History:  Past Medical History:  Diagnosis Date   Acute pain of right knee 03/07/2017   Anemia    Anorexia    Anxiety    Bitemporal hemianopsia    Borderline personality disorder (HCC)    Bulimia    Depression    Hyperlipidemia    Ocular migraine    Osteoporosis    Pigmentary retinal dystrophy    Retinitis pigmentosa    TBI (traumatic brain injury) (HCC)     Past Surgical History:  Procedure Laterality Date   CESAREAN SECTION      Current Medications: Current Facility-Administered Medications  Medication Dose Route Frequency Provider Last Rate Last Admin   acetaminophen  (TYLENOL ) tablet 650 mg  650 mg Oral Q6H PRN Nkwenti, Doris, NP       alum & mag hydroxide-simeth (MAALOX/MYLANTA) 200-200-20 MG/5ML suspension 30 mL  30 mL Oral Q4H PRN Nkwenti, Doris, NP       clonazePAM  (KLONOPIN ) disintegrating tablet 0.25 mg  0.25 mg Oral BID Dail Rankin RAMAN, RPH   0.25 mg at 08/26/23 0950   hydrOXYzine  (ATARAX ) tablet 50 mg  50 mg Oral Q6H PRN Jadapalle, Sree, MD   50 mg at 08/25/23 2350    magnesium  hydroxide (MILK OF MAGNESIA) suspension 30 mL  30 mL Oral Daily PRN Tex Drilling, NP       OLANZapine  zydis (ZYPREXA ) disintegrating tablet 5 mg  5 mg Oral TID PRN Tex Drilling, NP       pantoprazole  (PROTONIX ) EC tablet 40 mg  40 mg Oral Daily Nkwenti, Doris, NP   40 mg at 08/26/23 0949   polyethylene glycol (MIRALAX  / GLYCOLAX ) packet 17 g  17 g Oral Daily PRN Tex Drilling, NP   17 g at 08/26/23 0955   risperiDONE  (RISPERDAL )  tablet 0.5 mg  0.5 mg Oral QHS Demondre Aguas B, NP   0.5 mg at 08/25/23 2226   rosuvastatin  (CRESTOR ) tablet 20 mg  20 mg Oral Daily Jadapalle, Sree, MD   20 mg at 08/26/23 0950   sertraline  (ZOLOFT ) tablet 100 mg  100 mg Oral Daily Jadapalle, Sree, MD   100 mg at 08/26/23 0949   Vitamin D  (Ergocalciferol ) (DRISDOL ) 1.25 MG (50000 UNIT) capsule 50,000 Units  50,000 Units Oral Q7 days Tex Drilling, NP   50,000 Units at 08/21/23 1150    Lab Results: No results found for this or any previous visit (from the past 48 hours).  Blood Alcohol level:  Lab Results  Component Value Date   [facility] <15 08/20/2023    Metabolic Disorder Labs: Lab Results  Component Value Date   HGBA1C 4.9 08/20/2023   MPG 93.93 08/20/2023   Lab Results  Component Value Date   PROLACTIN 5.7 08/12/2013   Lab Results  Component Value Date   CHOL 152 08/20/2023   TRIG 76 08/20/2023   HDL 68 08/20/2023   CHOLHDL 2.2 08/20/2023   VLDL 15 08/20/2023   LDLCALC 69 08/20/2023    Physical Findings: AIMS:  , ,  ,  ,    CIWA:    COWS:      Psychiatric Specialty Exam:  Presentation  General Appearance:  Appropriate for Environment  Eye Contact: Good  Speech: Normal Rate  Speech Volume: Normal    Mood and Affect  Mood: Anxious  Affect: Tearful   Thought Process  Thought Processes: Coherent  Descriptions of Associations:Intact  Orientation:Full (Time, Place and Person)  Thought Content:Obsessions  Hallucinations:Hallucinations: None  Ideas of  Reference:None  Suicidal Thoughts:Suicidal Thoughts: No  Homicidal Thoughts:Homicidal Thoughts: No   Sensorium  Memory: Immediate Good; Recent Good; Remote Good  Judgment: Fair  Insight: Fair   Chartered certified accountant: Fair  Attention Span: Fair  Recall: Good  Fund of Knowledge: Good  Language: Good   Psychomotor Activity  Psychomotor Activity: Psychomotor Activity: Restlessness  Musculoskeletal: Strength & Muscle Tone: within normal limits Gait & Station: normal Assets  Assets: Desire for Improvement; Social Support; Resilience; Housing; Talents/Skills; Financial Resources/Insurance; Communication Skills    Physical Exam: Physical Exam ROS Blood pressure 122/78, pulse 76, temperature 97.6 F (36.4 C), resp. rate 16, height 5' 3 (1.6 m), weight 61.7 kg, last menstrual period 02/18/2004, SpO2 98%. Body mass index is 24.09 kg/m.  Diagnosis: Principal Problem:   OCD (obsessive compulsive disorder)   PLAN: Safety and Monitoring:  -- Voluntary admission to inpatient psychiatric unit for safety, stabilization and treatment  -- Daily contact with patient to assess and evaluate symptoms and progress in treatment  -- Patient's case to be discussed in multi-disciplinary team meeting  -- Observation Level : q15 minute checks  -- Vital signs:  q12 hours  -- Precautions: suicide, elopement, and assault -- Encouraged patient to participate in unit milieu and in scheduled group therapies  2. Psychiatric Diagnoses and Treatment:  Will continue sertraline  200 mg. Will continue with current dose of clonazepam  and continue taper as discussed.  Documentation of meal intake would be helpful.      3. Medical Issues Being Addressed:  None   4. Discharge Planning:   -- Social work and case management to assist with discharge planning and identification of hospital follow-up needs prior to discharge  -- Estimated LOS: 3-4 days  Daine KATHEE Ober,  NP 08/26/2023, 12:40 PM

## 2023-08-27 DIAGNOSIS — F422 Mixed obsessional thoughts and acts: Secondary | ICD-10-CM | POA: Diagnosis not present

## 2023-08-27 MED ORDER — QUETIAPINE FUMARATE 25 MG PO TABS
25.0000 mg | ORAL_TABLET | Freq: Three times a day (TID) | ORAL | Status: DC | PRN
  Administered 2023-08-27: 25 mg via ORAL
  Filled 2023-08-27: qty 1

## 2023-08-27 MED ORDER — QUETIAPINE FUMARATE 25 MG PO TABS
50.0000 mg | ORAL_TABLET | Freq: Every day | ORAL | Status: DC
  Administered 2023-08-27 – 2023-08-29 (×3): 50 mg via ORAL
  Filled 2023-08-27 (×3): qty 2

## 2023-08-27 NOTE — Group Note (Signed)
 Date:  08/27/2023 Time:  10:03 PM  Group Topic/Focus:  Wrap-Up Group:   The focus of this group is to help patients review their daily goal of treatment and discuss progress on daily workbooks.    Participation Level:  Active  Participation Quality:  Appropriate  Affect:  Appropriate  Cognitive:  Appropriate  Insight: Appropriate  Engagement in Group:  Engaged  Modes of Intervention:  Discussion  Additional Comments:    [name] CHRISTELLA Bunker 08/27/2023, 10:03 PM

## 2023-08-27 NOTE — BH IP Treatment Plan (Unsigned)
 Interdisciplinary Treatment and Diagnostic Plan Update  08/27/2023 Time of Session: 2:30PM [name] MRN: [id number]  Principal Diagnosis: OCD (obsessive compulsive disorder)  Secondary Diagnoses: Principal Problem:   OCD (obsessive compulsive disorder)   Current Medications:  Current Facility-Administered Medications  Medication Dose Route Frequency Provider Last Rate Last Admin   acetaminophen  (TYLENOL ) tablet 650 mg  650 mg Oral Q6H PRN Tex Drilling, NP       alum & mag hydroxide-simeth (MAALOX/MYLANTA) 200-200-20 MG/5ML suspension 30 mL  30 mL Oral Q4H PRN Nkwenti, Doris, NP       clonazePAM  (KLONOPIN ) disintegrating tablet 0.25 mg  0.25 mg Oral BID Dail Rankin RAMAN, RPH   0.25 mg at 08/27/23 1015   hydrOXYzine  (ATARAX ) tablet 50 mg  50 mg Oral Q6H PRN Jadapalle, Sree, MD   50 mg at 08/26/23 2311   magnesium  hydroxide (MILK OF MAGNESIA) suspension 30 mL  30 mL Oral Daily PRN Tex Drilling, NP       OLANZapine  zydis (ZYPREXA ) disintegrating tablet 5 mg  5 mg Oral TID PRN Tex Drilling, NP       pantoprazole  (PROTONIX ) EC tablet 40 mg  40 mg Oral Daily Nkwenti, Doris, NP   40 mg at 08/27/23 1015   polyethylene glycol (MIRALAX  / GLYCOLAX ) packet 17 g  17 g Oral Daily PRN Tex Drilling, NP   17 g at 08/27/23 0802   QUEtiapine  (SEROQUEL ) tablet 25 mg  25 mg Oral TID PRN Jadapalle, Sree, MD   25 mg at 08/27/23 1502   QUEtiapine  (SEROQUEL ) tablet 50 mg  50 mg Oral QHS Jadapalle, Sree, MD       rosuvastatin  (CRESTOR ) tablet 20 mg  20 mg Oral Daily Jadapalle, Sree, MD   20 mg at 08/27/23 1015   sertraline  (ZOLOFT ) tablet 100 mg  100 mg Oral Daily Jadapalle, Sree, MD   100 mg at 08/27/23 1015   Vitamin D  (Ergocalciferol ) (DRISDOL ) 1.25 MG (50000 UNIT) capsule 50,000 Units  50,000 Units Oral Q7 days Tex Drilling, NP   50,000 Units at 08/21/23 1150   PTA Medications: Facility-Administered Medications Prior to Admission  Medication Dose Route Frequency Provider Last Rate Last Admin    denosumab  (PROLIA ) injection 60 mg  60 mg Subcutaneous Once Shamleffer, Ibtehal Jaralla, MD       [START ON 12/23/2023] denosumab  (PROLIA ) injection 60 mg  60 mg Subcutaneous Q6 months Shamleffer, Ibtehal Jaralla, MD       Medications Prior to Admission  Medication Sig Dispense Refill Last Dose/Taking   Calcium  200 MG TABS       clonazePAM  (KLONOPIN ) 0.5 MG tablet Take 0.5 mg by mouth daily.      famotidine  (PEPCID ) 20 MG tablet Take 20 mg by mouth 2 (two) times daily.      ondansetron  (ZOFRAN -ODT) 4 MG disintegrating tablet Take 4 mg by mouth every 8 (eight) hours as needed.      pantoprazole  (PROTONIX ) 40 MG tablet Take 1 tablet (40 mg total) by mouth 2 (two) times daily. 60 tablet 5    pantoprazole  (PROTONIX ) 40 MG tablet Take by mouth.      PARoxetine (PAXIL) 30 MG tablet Take 30 mg by mouth daily.      Plecanatide 3 MG TABS Take by mouth.      polyethylene glycol (MIRALAX  / GLYCOLAX ) 17 g packet as needed.      QUEtiapine  (SEROQUEL ) 25 MG tablet Take by mouth. (Patient not taking: Reported on 05/11/2023)      rosuvastatin  (CRESTOR ) 20  MG tablet Take by mouth.      sucralfate  (CARAFATE ) 1 GM/10ML suspension Take by mouth.      Vitamin D , Ergocalciferol , (DRISDOL ) 1.25 MG (50000 UNIT) CAPS capsule Take by mouth.       Patient Stressors: Medication change or noncompliance    Patient Strengths: Ability for insight  Motivation for treatment/growth  Religious Affiliation  Supportive family/friends   Treatment Modalities: Medication Management, Group therapy, Case management,  1 to 1 session with clinician, Psychoeducation, Recreational therapy.   Physician Treatment Plan for Primary Diagnosis: OCD (obsessive compulsive disorder) Long Term Goal(s): Improvement in symptoms so as ready for discharge   Short Term Goals: Ability to identify changes in lifestyle to reduce recurrence of condition will improve Ability to verbalize feelings will improve Ability to disclose and discuss  suicidal ideas Ability to demonstrate self-control will improve Ability to identify and develop effective coping behaviors will improve  Medication Management: Evaluate patient's response, side effects, and tolerance of medication regimen.  Therapeutic Interventions: 1 to 1 sessions, Unit Group sessions and Medication administration.  Evaluation of Outcomes: Progressing  Physician Treatment Plan for Secondary Diagnosis: Principal Problem:   OCD (obsessive compulsive disorder)  Long Term Goal(s): Improvement in symptoms so as ready for discharge   Short Term Goals: Ability to identify changes in lifestyle to reduce recurrence of condition will improve Ability to verbalize feelings will improve Ability to disclose and discuss suicidal ideas Ability to demonstrate self-control will improve Ability to identify and develop effective coping behaviors will improve     Medication Management: Evaluate patient's response, side effects, and tolerance of medication regimen.  Therapeutic Interventions: 1 to 1 sessions, Unit Group sessions and Medication administration.  Evaluation of Outcomes: Progressing   RN Treatment Plan for Primary Diagnosis: OCD (obsessive compulsive disorder) Long Term Goal(s): Knowledge of disease and therapeutic regimen to maintain health will improve  Short Term Goals: Ability to demonstrate self-control, Ability to participate in decision making will improve, Ability to verbalize feelings will improve, Ability to disclose and discuss suicidal ideas, Ability to identify and develop effective coping behaviors will improve, and Compliance with prescribed medications will improve  Medication Management: RN will administer medications as ordered by provider, will assess and evaluate patient's response and provide education to patient for prescribed medication. RN will report any adverse and/or side effects to prescribing provider.  Therapeutic Interventions: 1 on 1  counseling sessions, Psychoeducation, Medication administration, Evaluate responses to treatment, Monitor vital signs and CBGs as ordered, Perform/monitor CIWA, COWS, AIMS and Fall Risk screenings as ordered, Perform wound care treatments as ordered.  Evaluation of Outcomes: Progressing   LCSW Treatment Plan for Primary Diagnosis: OCD (obsessive compulsive disorder) Long Term Goal(s): Safe transition to appropriate next level of care at discharge, Engage patient in therapeutic group addressing interpersonal concerns.  Short Term Goals: Engage patient in aftercare planning with referrals and resources, Increase social support, Increase ability to appropriately verbalize feelings, Increase emotional regulation, Facilitate acceptance of mental health diagnosis and concerns, and Increase skills for wellness and recovery  Therapeutic Interventions: Assess for all discharge needs, 1 to 1 time with Social worker, Explore available resources and support systems, Assess for adequacy in community support network, Educate family and significant other(s) on suicide prevention, Complete Psychosocial Assessment, Interpersonal group therapy.  Evaluation of Outcomes: Progressing   Progress in Treatment: Attending groups: Yes. Participating in groups: Yes. Taking medication as prescribed: Yes. Toleration medication: Yes. Family/Significant other contact made: Yes, individual(s) contacted:  collaterals attempt were made  with pt's brother have been unsuccessful Patient understands diagnosis: Yes Discussing patient identified problems/goals with staff: Yes. Medical problems stabilized or resolved: Yes. Denies suicidal/homicidal ideation: Yes. Issues/concerns per patient self-inventory: No. Other: none  New problem(s) identified: No, Describe:  None identified  Update 08/27/2023:  No changes at this time.    New Short Term/Long Term Goal(s): elimination of symptoms of psychosis, medication management for  mood stabilization; elimination of SI thoughts; development of comprehensive mental wellness/sobriety plan. Update 08/27/2023:  No changes at this time.    Patient Goals:  To become feeling connected and not so panic driven Update 08/27/2023:  No changes at this time.    Discharge Plan or Barriers: CSW will assist with appropriate discharge planning Update 08/27/2023:  Patient continues to struggle with anxiety and poor insight into reasons for admission.  Patient continues to self-sabotage her wellbeing.    Reason for Continuation of Hospitalization: Anxiety Medication stabilization Withdrawal symptoms   Estimated Length of Stay: 1 to 7 days  Update 08/27/2023:  TBD   Last 3 [place] Suicide Severity Risk Score: Flowsheet Row Admission (Current) from 08/20/2023 in [facility] Most recent reading at 08/20/2023 10:00 PM ED from 08/20/2023 in [facility] Most recent reading at 08/20/2023  4:32 PM ED from 02/15/2022 in [facility] Emergency Department at [facility] Most recent reading at 02/15/2022  3:50 PM  C-SSRS RISK CATEGORY No Risk No Risk No Risk    Last PHQ 2/9 Scores:    03/07/2017    8:12 AM 01/31/2017    8:28 AM 01/17/2017   10:11 AM  Depression screen PHQ 2/9  Decreased Interest 0 0 0  Down, Depressed, Hopeless 0 0 0  PHQ - 2 Score 0 0 0    Scribe for Treatment Team: Sherryle JINNY Margo, LCSW 08/27/2023 4:18 PM

## 2023-08-27 NOTE — Progress Notes (Signed)
   08/26/23 2200  Psych Admission Type (Psych Patients Only)  Admission Status Voluntary  Psychosocial Assessment  Patient Complaints Anxiety  Eye Contact Fair  Facial Expression Anxious  Affect Appropriate to circumstance  Speech Logical/coherent  Interaction Assertive  Motor Activity Restless  Appearance/Hygiene In scrubs  Behavior Characteristics Appropriate to situation  Mood Pleasant  Aggressive Behavior  Effect No apparent injury  Thought Process  Coherency Loose associations  Content WDL  Delusions None reported or observed  Perception Derealization  Hallucination None reported or observed  Judgment Impaired  Confusion Mild  Danger to Self  Current suicidal ideation? Denies  Danger to Others  Danger to Others None reported or observed

## 2023-08-27 NOTE — Progress Notes (Signed)
 [facility] MD Progress Note  08/27/2023 3:33 PM [name]  MRN:  [id number]  [name] 68 year old female accompanied by [name]) reports for the past 10 days experience anxiety and desocialization, I just don't feel like I am home. History of dx anxiety, eating disorder, and OCD. Report her psychiatrist Dr. Elouise Rous with [facility] thinks it's her OCD thoughts. Therapist Warren Delaine and eating disorder dietitian. Report when wakes up with sweats and does not know where she it. During writer's encounter with patient, mood is depressed, & is extremely anxious, reports being unsure if I can go on and live like this . Patient is admitted to [facility] unit with Q15 min safety monitoring. Multidisciplinary team approach is offered. Medication management; group/milieu therapy is offered.   Subjective:  Chart reviewed, case discussed in multidisciplinary meeting, patient seen during rounds.  Patient is noted to be walking in the hallway.  She talked to the provider in the room.  She reports that she is not at all doing well on Risperdal  and she absolutely does not want to be on Risperdal .  She reports since her Seroquel  was stopped she was not able to sleep and she feels that she is losing it.  She reports that she wants to go home and want to be with her cats.  Provider updated her on the safety concerns that his brother has shared with the Child psychotherapist.  Patient reports that she can have a friend come stay with her.  Given the tearfulness and mood lability that she is displaying with both providers since yesterday provider did agree to switch her Risperdal  to Seroquel  and added extra Seroquel  25 mg every 8 hours as needed to be utilized throughout the day.  Patient also encouraged patient to look into the rehab for OCD.  She denies SI/HI/plan and denies hallucinations.   Sleep: Good  Appetite:  Good  Past Psychiatric History: see h&P Family History:  Family History  Problem  Relation Age of Onset   Polymyalgia rheumatica Mother    Osteoporosis Mother    Cancer Father    Heart disease Father        CHF   Diabetes Sister        Obesity   Stomach cancer Maternal Grandfather    Colon cancer Neg Hx    Esophageal cancer Neg Hx    Rectal cancer Neg Hx    Social History:  Social History   Substance and Sexual Activity  Alcohol Use No     Social History   Substance and Sexual Activity  Drug Use No    Social History   Socioeconomic History   Marital status: Divorced    Spouse name: Not on file   Number of children: 1   Years of education: Not on file   Highest education level: Not on file  Occupational History   Occupation: Scientist, product/process development education    Employer: [employer]   Occupation: Consulting civil engineer   Occupation: Restaurant manager, fast food   Occupation: Architect    Comment: for person's with Parkinson's Disease  Tobacco Use   Smoking status: Never   Smokeless tobacco: Never  Vaping Use   Vaping status: Never Used  Substance and Sexual Activity   Alcohol use: No   Drug use: No   Sexual activity: Not on file  Other Topics Concern   Not on file  Social History Narrative   ** Merged History Encounter **       Social  Drivers of Health   Financial Resource Strain: Low Risk  (04/19/2023)   Received from [facility]   Overall Financial Resource Strain (CARDIA)    Difficulty of Paying Living Expenses: Not hard at all  Food Insecurity: No Food Insecurity (08/20/2023)   Hunger Vital Sign    Worried About Running Out of Food in the Last Year: Never true    Ran Out of Food in the Last Year: Never true  Transportation Needs: No Transportation Needs (08/20/2023)   PRAPARE - Administrator, Civil Service (Medical): No    Lack of Transportation (Non-Medical): No  Physical Activity: Sufficiently Active (03/12/2023)   Received from [facility]   Exercise Vital Sign    On average, how many days per week do you engage in moderate to  strenuous exercise (like a brisk walk)?: 7 days    On average, how many minutes do you engage in exercise at this level?: 60 min  Stress: Stress Concern Present (03/12/2023)   Received from [facility] - Occupational Stress Questionnaire    Feeling of Stress : Very much  Social Connections: Unknown (08/21/2023)   Social Connection and Isolation Panel    Frequency of Communication with Friends and Family: Three times a week    Frequency of Social Gatherings with Friends and Family: Once a week    Attends Religious Services: 1 to 4 times per year    Active Member of [employer] or Organizations: Yes    Attends Banker Meetings: 1 to 4 times per year    Marital Status: Patient declined   Past Medical History:  Past Medical History:  Diagnosis Date   Acute pain of right knee 03/07/2017   Anemia    Anorexia    Anxiety    Bitemporal hemianopsia    Borderline personality disorder (HCC)    Bulimia    Depression    Hyperlipidemia    Ocular migraine    Osteoporosis    Pigmentary retinal dystrophy    Retinitis pigmentosa    TBI (traumatic brain injury) (HCC)     Past Surgical History:  Procedure Laterality Date   CESAREAN SECTION      Current Medications: Current Facility-Administered Medications  Medication Dose Route Frequency Provider Last Rate Last Admin   acetaminophen  (TYLENOL ) tablet 650 mg  650 mg Oral Q6H PRN Nkwenti, Doris, NP       alum & mag hydroxide-simeth (MAALOX/MYLANTA) 200-200-20 MG/5ML suspension 30 mL  30 mL Oral Q4H PRN Nkwenti, Doris, NP       clonazePAM  (KLONOPIN ) disintegrating tablet 0.25 mg  0.25 mg Oral BID Dail Rankin RAMAN, RPH   0.25 mg at 08/27/23 1015   hydrOXYzine  (ATARAX ) tablet 50 mg  50 mg Oral Q6H PRN Yashas Camilli, MD   50 mg at 08/26/23 2311   magnesium  hydroxide (MILK OF MAGNESIA) suspension 30 mL  30 mL Oral Daily PRN Tex Drilling, NP       OLANZapine  zydis (ZYPREXA ) disintegrating tablet 5  mg  5 mg Oral TID PRN Tex Drilling, NP       pantoprazole  (PROTONIX ) EC tablet 40 mg  40 mg Oral Daily Nkwenti, Doris, NP   40 mg at 08/27/23 1015   polyethylene glycol (MIRALAX  / GLYCOLAX ) packet 17 g  17 g Oral Daily PRN Tex Drilling, NP   17 g at 08/27/23 0802   QUEtiapine  (SEROQUEL ) tablet 25 mg  25 mg Oral TID PRN Chrislyn Seedorf,  MD   25 mg at 08/27/23 1502   QUEtiapine  (SEROQUEL ) tablet 50 mg  50 mg Oral QHS Kaleesi Guyton, MD       rosuvastatin  (CRESTOR ) tablet 20 mg  20 mg Oral Daily Teddrick Mallari, MD   20 mg at 08/27/23 1015   sertraline  (ZOLOFT ) tablet 100 mg  100 mg Oral Daily Theda Payer, MD   100 mg at 08/27/23 1015   Vitamin D  (Ergocalciferol ) (DRISDOL ) 1.25 MG (50000 UNIT) capsule 50,000 Units  50,000 Units Oral Q7 days Tex Drilling, NP   50,000 Units at 08/21/23 1150    Lab Results: No results found for this or any previous visit (from the past 48 hours).  Blood Alcohol level:  Lab Results  Component Value Date   [facility] <15 08/20/2023    Metabolic Disorder Labs: Lab Results  Component Value Date   HGBA1C 4.9 08/20/2023   MPG 93.93 08/20/2023   Lab Results  Component Value Date   PROLACTIN 5.7 08/12/2013   Lab Results  Component Value Date   CHOL 152 08/20/2023   TRIG 76 08/20/2023   HDL 68 08/20/2023   CHOLHDL 2.2 08/20/2023   VLDL 15 08/20/2023   LDLCALC 69 08/20/2023    Physical Findings: AIMS:  , ,  ,  ,    CIWA:    COWS:      Psychiatric Specialty Exam:  Presentation  General Appearance:  Appropriate for Environment  Eye Contact: Good  Speech: Normal Rate  Speech Volume: Normal    Mood and Affect  Mood: Anxious  Affect: Tearful   Thought Process  Thought Processes: Coherent  Descriptions of Associations:Intact  Orientation:Full (Time, Place and Person)  Thought Content:Obsessions  Hallucinations:Hallucinations: None  Ideas of Reference:None  Suicidal Thoughts:Suicidal Thoughts: No  Homicidal  Thoughts:Homicidal Thoughts: No   Sensorium  Memory: Immediate Good; Recent Good; Remote Good  Judgment: Fair  Insight: Fair   Chartered certified accountant: Fair  Attention Span: Fair  Recall: Good  Fund of Knowledge: Good  Language: Good   Psychomotor Activity  Psychomotor Activity: Psychomotor Activity: Restlessness  Musculoskeletal: Strength & Muscle Tone: within normal limits Gait & Station: normal Assets  Assets: Desire for Improvement; Social Support; Resilience; Housing; Talents/Skills; Financial Resources/Insurance; Communication Skills    Physical Exam: Physical Exam Vitals and nursing note reviewed.    ROS Blood pressure 115/73, pulse 79, temperature 97.8 F (36.6 C), resp. rate 16, height 5' 3 (1.6 m), weight 61.7 kg, last menstrual period 02/18/2004, SpO2 96%. Body mass index is 24.09 kg/m.  Diagnosis: Principal Problem:   OCD (obsessive compulsive disorder)   PLAN: Safety and Monitoring:  -- Voluntary admission to inpatient psychiatric unit for safety, stabilization and treatment  -- Daily contact with patient to assess and evaluate symptoms and progress in treatment  -- Patient's case to be discussed in multi-disciplinary team meeting  -- Observation Level : q15 minute checks  -- Vital signs:  q12 hours  -- Precautions: suicide, elopement, and assault -- Encouraged patient to participate in unit milieu and in scheduled group therapies  2. Psychiatric Diagnoses and Treatment:  Will continue sertraline  100 mg.  Patient continues to refuse to take Risperdal  due to reported minimal benefit, discontinued on 08/27/2023 and restarted Seroquel  50 mg nightly and added Seroquel  25 mg every 8 hours as needed for anxiety Will continue with current dose of clonazepam  and continue taper as discussed.  Documentation of meal intake would be helpful.      3. Medical Issues Being  Addressed:  None   4. Discharge Planning:   -- Social  work and case management to assist with discharge planning and identification of hospital follow-up needs prior to discharge  -- Estimated LOS: 3-4 days  Allyn Foil, MD 08/27/2023, 3:33 PM

## 2023-08-27 NOTE — Group Note (Signed)
 Date:  08/27/2023 Time:  11:33 AM  Group Topic/Focus:  Personal Choices and Values:   The focus of this group is to help patients assess and explore the importance of values in their lives, how their values affect their decisions, how they express their values and what opposes their expression.    Participation Level:  Active  Participation Quality:  Appropriate  Affect:  Appropriate  Cognitive:  Appropriate  Insight: Good  Engagement in Group:  Engaged  Modes of Intervention:  Discussion  Additional Comments:  N/A  Harlene LITTIE Gavel 08/27/2023, 11:33 AM

## 2023-08-27 NOTE — Plan of Care (Signed)
   Problem: Education: Goal: Ability to state activities that reduce stress will improve Outcome: Progressing   Problem: Coping: Goal: Ability to identify and develop effective coping behavior will improve Outcome: Progressing

## 2023-08-27 NOTE — Progress Notes (Signed)
   08/27/23 2200  Psych Admission Type (Psych Patients Only)  Admission Status Voluntary  Psychosocial Assessment  Patient Complaints Anxiety  Eye Contact Fair  Facial Expression Anxious  Affect Appropriate to circumstance  Speech Logical/coherent  Interaction Assertive  Motor Activity Restless  Appearance/Hygiene Unremarkable;In scrubs  Behavior Characteristics Cooperative;Appropriate to situation  Mood Pleasant  Aggressive Behavior  Effect No apparent injury  Thought Process  Coherency WDL  Content WDL  Delusions None reported or observed  Perception WDL  Hallucination None reported or observed  Judgment Impaired  Confusion Mild  Danger to Self  Current suicidal ideation? Denies  Danger to Others  Danger to Others None reported or observed

## 2023-08-27 NOTE — Progress Notes (Signed)
 Patient is pleasant and cooperative. Restless and pacing the halls. Endorses anxiety.  Denies SI/HI and AVH.  Denies depression.  Denies pain.   Compliant with scheduled medications.  PRN medications given for constipation and anxiety. 15 min checks in place for safety.  Patient is present in the milieu.  Appropriate interaction with peers and staff.

## 2023-08-27 NOTE — Group Note (Signed)
 Recreation Therapy Group Note   Group Topic:Communication  Group Date: 08/27/2023 Start Time: 1400 End Time: 1455 Facilitators: Celestia Jeoffrey BRAVO, LRT, CTRS Location: Dayroom  Group Description: Life Boat. Patients were given the scenario that they are on a boat that is about to become shipwrecked, leaving them stranded [place]. They are asked to make a list of 10 different items that they want to take with them when they are stranded on the [place]. Patients are asked to rank their items from most important to least important, #1 being the most important and #10 being the least. Patients will work individually for the first round to come up with 10 items and then pair up with a peer(s) to condense their list and come up with one list of 10 items between the two of them. Patients or LRT will read aloud the 10 different items to the group after each round. LRT facilitated post-activity processing to discuss how this activity can be used in daily life post discharge.   Goal Area(s) Addressed:  Patient will identify priorities, wants and needs. Patient will communicate with LRT and peers. Patient will work Engineering geologist as a Administrator, Civil Service. Patient will work on Product manager   Affect/Mood: Appropriate and Flat   Participation Level: Active and Engaged   Participation Quality: Independent   Behavior: Appropriate, Calm, and Cooperative   Speech/Thought Process: Coherent   Insight: Good   Judgement: Good   Modes of Intervention: Group work, Guided Discussion, and Problem-solving   Patient Response to Interventions:  Attentive, Engaged, Interested , and Receptive   Education Outcome:  Acknowledges education   Clinical Observations/Individualized Feedback: [name] was active in their participation of session activities and group discussion. Pt identified dogs, communication device, tools, sleeping bag, and solar flash light as things she would bring with her.    Plan: Continue  to engage patient in RT group sessions 2-3x/week.   Jeoffrey BRAVO Celestia, LRT, CTRS 08/27/2023 4:52 PM

## 2023-08-27 NOTE — BHH Counselor (Signed)
 CSW received email from pt's outpatient provider Dr.Faust who gave list of recommended OCD focused facilities.   CSW provided list to pt per her request.   CSW also provided pt phone number for her brother Lael and her outpatient psychiatrist per her request.   Lum Croft, MSW, [facility] 08/27/2023 1:05 PM

## 2023-08-27 NOTE — Plan of Care (Signed)
   Problem: Education: Goal: Mental status will improve Outcome: Progressing Goal: Verbalization of understanding the information provided will improve Outcome: Progressing   Problem: Activity: Goal: Interest or engagement in activities will improve Outcome: Progressing

## 2023-08-28 DIAGNOSIS — F422 Mixed obsessional thoughts and acts: Secondary | ICD-10-CM | POA: Diagnosis not present

## 2023-08-28 NOTE — Progress Notes (Signed)
   08/28/23 2200  Psych Admission Type (Psych Patients Only)  Admission Status Voluntary  Psychosocial Assessment  Patient Complaints Anxiety  Eye Contact Fair  Facial Expression Anxious  Affect Anxious  Speech Logical/coherent  Interaction Assertive  Motor Activity Restless  Appearance/Hygiene Unremarkable  Behavior Characteristics Cooperative;Appropriate to situation  Mood Pleasant;Anxious  Thought Process  Coherency WDL  Content WDL  Delusions None reported or observed  Perception WDL  Hallucination None reported or observed  Judgment Impaired  Confusion Mild  Danger to Self  Current suicidal ideation? Denies  Danger to Others  Danger to Others None reported or observed

## 2023-08-28 NOTE — Plan of Care (Addendum)
  Problem: Coping: Goal: Ability to identify and develop effective coping behavior will improve Outcome: Progressing   Problem: Education: Goal: Verbalization of understanding the information provided will improve Outcome: Progressing   Problem: Activity: Goal: Interest or engagement in activities will improve Outcome: Progressing   Problem: Self-Concept: Goal: Level of anxiety will decrease Outcome: Not Progressing

## 2023-08-28 NOTE — Progress Notes (Signed)
 [facility] MD Progress Note  08/28/2023 4:36 PM [name]  MRN:  [id number]  [name] 68 year old female accompanied by [name]) reports for the past 10 days experience anxiety and desocialization, I just don't feel like I am home. History of dx anxiety, eating disorder, and OCD. Report her psychiatrist [name] with [facility] thinks it's her OCD thoughts. Therapist [name] and eating disorder dietitian. Report when wakes up with sweats and does not know where she it. During writer's encounter with patient, mood is depressed, & is extremely anxious, reports being unsure if I can go on and live like this . Patient is admitted to [name] unit with Q15 min safety monitoring. Multidisciplinary team approach is offered. Medication management; group/milieu therapy is offered.   Subjective:  Chart reviewed, case discussed in multidisciplinary meeting, today on interview patient is observed to be talking to her peers, laughing, eating her breakfast, calm, walking in the hallway.  During the interview with the provider she makes statements of not doing well and endorses that anxiety about being in the hospital and not able to see her dogs.  Provider explained her intrusive thoughts about worrying about her worries and being fixated about feeling anxious as her vitals have been stable with no physiological response to her anxiety.  Provider continue to educate patient to consider residential facilities and intensive outpatient therapy including exposure and response prevention and even EMDR.  Patient is agreeable for all the treatment plan.  Patient informed the provider that she is planning to stay with 1 or 2 of her friends after discharge and continue to work with her outpatient doctor to get into the residential facilities.  She reports calling a facility and the wait line is for 3 to 4 months.  She denies SI/HI/plan and denies hallucinations.  She is not responding to any  internal stimuli and is not noted to be acting on any intrusive thoughts. Sleep: Good  Appetite:  Good  Past Psychiatric History: see h&P Family History:  Family History  Problem Relation Age of Onset   Polymyalgia rheumatica Mother    Osteoporosis Mother    Cancer Father    Heart disease Father        CHF   Diabetes Sister        Obesity   Stomach cancer Maternal Grandfather    Colon cancer Neg Hx    Esophageal cancer Neg Hx    Rectal cancer Neg Hx    Social History:  Social History   Substance and Sexual Activity  Alcohol Use No     Social History   Substance and Sexual Activity  Drug Use No    Social History   Socioeconomic History   Marital status: Divorced    Spouse name: Not on file   Number of children: 1   Years of education: Not on file   Highest education level: Not on file  Occupational History   Occupation: Scientist, product/process development education    Employer: [name]   Occupation: Consulting civil engineer   Occupation: Restaurant manager, fast food   Occupation: Architect    Comment: for person's with Parkinson's Disease  Tobacco Use   Smoking status: Never   Smokeless tobacco: Never  Vaping Use   Vaping status: Never Used  Substance and Sexual Activity   Alcohol use: No   Drug use: No   Sexual activity: Not on file  Other Topics Concern   Not on file  Social History Narrative   **  Merged History Encounter **       Social Drivers of Health   Financial Resource Strain: Low Risk  (04/19/2023)   Received from [facility]   Overall Financial Resource Strain (CARDIA)    Difficulty of Paying Living Expenses: Not hard at all  Food Insecurity: No Food Insecurity (08/20/2023)   Hunger Vital Sign    Worried About Running Out of Food in the Last Year: Never true    Ran Out of Food in the Last Year: Never true  Transportation Needs: No Transportation Needs (08/20/2023)   PRAPARE - Administrator, Civil Service (Medical): No    Lack of Transportation  (Non-Medical): No  Physical Activity: Sufficiently Active (03/12/2023)   Received from [facility]   Exercise Vital Sign    On average, how many days per week do you engage in moderate to strenuous exercise (like a brisk walk)?: 7 days    On average, how many minutes do you engage in exercise at this level?: 60 min  Stress: Stress Concern Present (03/12/2023)   Received from [facility] - Occupational Stress Questionnaire    Feeling of Stress : Very much  Social Connections: Unknown (08/21/2023)   Social Connection and Isolation Panel    Frequency of Communication with Friends and Family: Three times a week    Frequency of Social Gatherings with Friends and Family: Once a week    Attends Religious Services: 1 to 4 times per year    Active Member of [employer] or Organizations: Yes    Attends Banker Meetings: 1 to 4 times per year    Marital Status: Patient declined   Past Medical History:  Past Medical History:  Diagnosis Date   Acute pain of right knee 03/07/2017   Anemia    Anorexia    Anxiety    Bitemporal hemianopsia    Borderline personality disorder (HCC)    Bulimia    Depression    Hyperlipidemia    Ocular migraine    Osteoporosis    Pigmentary retinal dystrophy    Retinitis pigmentosa    TBI (traumatic brain injury) (HCC)     Past Surgical History:  Procedure Laterality Date   CESAREAN SECTION      Current Medications: Current Facility-Administered Medications  Medication Dose Route Frequency Provider Last Rate Last Admin   acetaminophen  (TYLENOL ) tablet 650 mg  650 mg Oral Q6H PRN Nkwenti, Doris, NP       alum & mag hydroxide-simeth (MAALOX/MYLANTA) 200-200-20 MG/5ML suspension 30 mL  30 mL Oral Q4H PRN Nkwenti, Doris, NP   30 mL at 08/28/23 0906   clonazePAM  (KLONOPIN ) disintegrating tablet 0.25 mg  0.25 mg Oral BID Dail Rankin RAMAN, RPH   0.25 mg at 08/28/23 9097   hydrOXYzine  (ATARAX ) tablet 50 mg  50 mg  Oral Q6H PRN Voyd Groft, MD   50 mg at 08/28/23 0032   magnesium  hydroxide (MILK OF MAGNESIA) suspension 30 mL  30 mL Oral Daily PRN Tex Drilling, NP       OLANZapine  zydis (ZYPREXA ) disintegrating tablet 5 mg  5 mg Oral TID PRN Tex Drilling, NP       pantoprazole  (PROTONIX ) EC tablet 40 mg  40 mg Oral Daily Nkwenti, Doris, NP   40 mg at 08/28/23 0717   polyethylene glycol (MIRALAX  / GLYCOLAX ) packet 17 g  17 g Oral Daily PRN Tex Drilling, NP   17 g at 08/28/23 0818  QUEtiapine  (SEROQUEL ) tablet 25 mg  25 mg Oral TID PRN Diavian Furgason, MD   25 mg at 08/27/23 1502   QUEtiapine  (SEROQUEL ) tablet 50 mg  50 mg Oral QHS Essence Merle, MD   50 mg at 08/27/23 2231   rosuvastatin  (CRESTOR ) tablet 20 mg  20 mg Oral Daily Antwon Rochin, MD   20 mg at 08/28/23 0902   sertraline  (ZOLOFT ) tablet 100 mg  100 mg Oral Daily Nhat Hearne, MD   100 mg at 08/28/23 9097   Vitamin D  (Ergocalciferol ) (DRISDOL ) 1.25 MG (50000 UNIT) capsule 50,000 Units  50,000 Units Oral Q7 days Tex Drilling, NP   50,000 Units at 08/28/23 9097    Lab Results: No results found for this or any previous visit (from the past 48 hours).  Blood Alcohol level:  Lab Results  Component Value Date   [facility] <15 08/20/2023    Metabolic Disorder Labs: Lab Results  Component Value Date   HGBA1C 4.9 08/20/2023   MPG 93.93 08/20/2023   Lab Results  Component Value Date   PROLACTIN 5.7 08/12/2013   Lab Results  Component Value Date   CHOL 152 08/20/2023   TRIG 76 08/20/2023   HDL 68 08/20/2023   CHOLHDL 2.2 08/20/2023   VLDL 15 08/20/2023   LDLCALC 69 08/20/2023    Physical Findings: AIMS:  , ,  ,  ,    CIWA:    COWS:      Psychiatric Specialty Exam:  Presentation  General Appearance:  Appropriate for Environment  Eye Contact: Good  Speech: Normal Rate  Speech Volume: Normal    Mood and Affect  Mood: Anxious  Affect: Tearful   Thought Process  Thought  Processes: Coherent  Descriptions of Associations:Intact  Orientation:Full (Time, Place and Person)  Thought Content:Obsessions  Hallucinations: Denies  Ideas of Reference:None  Suicidal Thoughts: Denies  Homicidal Thoughts: Denies   Sensorium  Memory: Immediate Good; Recent Good; Remote Good  Judgment: Fair  Insight: Fair   Chartered certified accountant: Fair  Attention Span: Fair  Recall: Good  Fund of Knowledge: Good  Language: Good   Psychomotor Activity  Psychomotor Activity: No data recorded  Musculoskeletal: Strength & Muscle Tone: within normal limits Gait & Station: normal Assets  Assets: Desire for Improvement; Social Support; Resilience; Housing; Talents/Skills; Financial Resources/Insurance; Communication Skills    Physical Exam: Physical Exam Vitals and nursing note reviewed.    ROS Blood pressure 122/70, pulse 72, temperature (!) 97.4 F (36.3 C), resp. rate 18, height 5' 3 (1.6 m), weight 61.7 kg, last menstrual period 02/18/2004, SpO2 98%. Body mass index is 24.09 kg/m.  Diagnosis: Principal Problem:   OCD (obsessive compulsive disorder)   PLAN: Safety and Monitoring:  -- Voluntary admission to inpatient psychiatric unit for safety, stabilization and treatment  -- Daily contact with patient to assess and evaluate symptoms and progress in treatment  -- Patient's case to be discussed in multi-disciplinary team meeting  -- Observation Level : q15 minute checks  -- Vital signs:  q12 hours  -- Precautions: suicide, elopement, and assault -- Encouraged patient to participate in unit milieu and in scheduled group therapies  2. Psychiatric Diagnoses and Treatment:  Will continue sertraline  100 mg.  Patient continues to refuse to take Risperdal  due to reported minimal benefit, discontinued on 08/27/2023 and restarted Seroquel  50 mg nightly and added Seroquel  25 mg every 8 hours as needed for anxiety Will continue with  current dose of clonazepam  and continue taper as discussed.  Documentation of meal intake would be helpful.      3. Medical Issues Being Addressed:  None   4. Discharge Planning:   -- Social work and case management to assist with discharge planning and identification of hospital follow-up needs prior to discharge  -- Estimated LOS: 3-4 days  Allyn Foil, MD 08/28/2023, 4:36 PM

## 2023-08-28 NOTE — Group Note (Signed)
 Recreation Therapy Group Note   Group Topic:Health and Wellness  Group Date: 08/28/2023 Start Time: 1400 End Time: 1440 Facilitators: Celestia Jeoffrey BRAVO, LRT, CTRS Location: Dayroom  Group Description: Seated Exercise. LRT discussed the mental and physical benefits of exercise. LRT and group discussed how physical activity can be used as a coping skill. Pt's and LRT followed along to an exercise video on the TV screen that provided a visual representation and audio description of every exercise performed. Pt's encouraged to listen to their bodies and stop at any time if they experience feelings of discomfort or pain. Pts were encouraged to drink water and stay hydrated.   Goal Area(s) Addressed: Patient will learn benefits of physical activity. Patient will identify exercise as a coping skill.  Patient will follow multistep directions. Patient will try a new leisure interest.    Affect/Mood: Appropriate   Participation Level: Active and Engaged   Participation Quality: Independent   Behavior: Appropriate, Calm, and Cooperative   Speech/Thought Process: Coherent   Insight: Good and Improved   Judgement: Good   Modes of Intervention: Activity, Exploration, and Support   Patient Response to Interventions:  Attentive, Engaged, Interested , and Receptive   Education Outcome:  Acknowledges education   Clinical Observations/Individualized Feedback: [name] was active in their participation of session activities and group discussion. Pt completed all exercises as prompted. Pt shared that exercise is good for the mind. Pt interacted well with LRT and peers duration of session.    Plan: Continue to engage patient in RT group sessions 2-3x/week.   Jeoffrey BRAVO Celestia, LRT, CTRS 08/28/2023 4:38 PM

## 2023-08-28 NOTE — Progress Notes (Signed)
 Patient is restless and pacing the halls.  Endorses anxiety.  Denies SI/HI and AVH.  Denies depression.  Denies pain.  Reports constipation, heart burn and indigestion. Fair sleep last night.   Compliant with scheduled medications.  PRN medications given for indigestion and constipation.  15 min checks in place for safety.  Patient is present in the milieu at times.  Mostly pacing in the hallways. Appropriate interaction with peers and staff.

## 2023-08-28 NOTE — Group Note (Signed)
 Date:  08/28/2023 Time:  11:21 AM  Group Topic/Focus:  Outside Rec/Music Therapy The purpose of this group is for patients to go outside and get fresh air while participating in outside activities while  listening to soothing music.     Participation Level:  Active  Participation Quality:  Appropriate  Affect:  Appropriate  Cognitive:  Appropriate  Insight: Appropriate  Engagement in Group:  Engaged  Modes of Intervention:  Activity  Additional Comments:    Beatris ONEIDA Hasten 08/28/2023, 11:21 AM

## 2023-08-28 NOTE — Plan of Care (Signed)

## 2023-08-28 NOTE — BHH Counselor (Signed)
 CSW spoke with pt per her request.   Pt reported to CSW that she put herself on the wait list at some of the OCD treatment centers that her outpatient provider sent to CSW.   Pt reports she can stay with her friend Nathanel Gallon following discharge for a few days and that CSW can contact her.   Pt reports she is thinking about going to [place]  to be closer to her brother and would like a list of facilities in [place]  for OCD.   CSW did a brief search for facilities and provided information on OCD facilities in [place] per pt's request.   Lum Croft, MSW, [facility] 08/28/2023 2:08 PM

## 2023-08-29 NOTE — Group Note (Signed)
 Date:  08/29/2023 Time:  11:20 AM  Group Topic/Focus:  Goals/Karaoke Group:   The focus of this group is to help patients establish daily goals to achieve during treatment and discuss how the patient can incorporate goal setting into their daily lives to aide in recovery. Also singing along to their favorite tunes and interacting with one another.     Participation Level:  Active  Participation Quality:  Appropriate  Affect:  Appropriate  Cognitive:  Appropriate  Insight: Appropriate  Engagement in Group:  Engaged  Modes of Intervention:  Activity  Additional Comments:    Beatris ONEIDA Hasten 08/29/2023, 11:20 AM

## 2023-08-29 NOTE — Progress Notes (Signed)
 Patient is a voluntary admission to [facility] for OCD and anxiety with history of borderline and eating disorder. While here patient has been eating well and interacting well with peers and staff and participating in group activities.  Patient is scheduled for discharge tomorrow.  Will continue to monitor.

## 2023-08-29 NOTE — Group Note (Signed)
 Physical/Occupational Therapy Group Note  Group Topic: Yoga  Group Date: 08/29/2023 Start Time: 1308 End Time: 1338 Facilitators: Thula Stewart, Alm Hamilton, PT   Group Description: Group participated with series of yoga poses, designed to emphasize functional sitting balance, core stability, generalized flexibility and overall posture.  Incorporated deep breathing techniques with poses, working to promote relaxation, mindfulness and focus with targeted activities.   Discussed benefits of yoga in improving mood and self-esteem, reducing stress and anxiety, and promoting functional strength and balance for each participant.  Discussed ways to integrate into each participant's daily routine.  Provided handout with written and pictorial descriptions of included yoga movements to be utilized as appropriate outside of group time.  Therapeutic Goal(s):  Demonstrate safe ability to participate with yoga poses during group activity. Identify one benefit of participation with yoga poses as part of each participant's exercise/movement routine. Identify 1-2 individual poses that participant feels most beneficial to his/her needs and that he/she can easily replicate outside of group.  Individual Participation: Pt was primarily quietly observant during the session with occasional participation during the activity portion of the session.  Participation Level: Minimal   Participation Quality: Minimal Cues   Behavior: Appropriate   Speech/Thought Process: Quietly observant only this session    Affect/Mood: Appropriate   Insight: Good   Judgement: Good   Modes of Intervention: Activity, Discussion, and Education  Patient Response to Interventions:  Attentive   Plan: Continue to engage patient in PT/OT groups 1 - 2x/week.  CHARM Hamilton Bertin PT, DPT 08/29/23, 4:43 PM

## 2023-08-29 NOTE — Plan of Care (Signed)

## 2023-08-29 NOTE — BHH Counselor (Signed)
 CSW contacted Nathanel Ket, [phone] pt's friend per provider's request.   CSW unable to reach, left HIPAA compliant VM requesting return call.   Lum Croft, MSW, [place] 08/29/2023 3:58 PM

## 2023-08-29 NOTE — Progress Notes (Signed)
 [facility] MD Progress Note  08/29/2023 4:17 PM [name]  MRN:  [id number]  [name] 68 year old female accompanied by her friend [name]) reports for the past 10 days experience anxiety and desocialization, I just don't feel like I am home. History of dx anxiety, eating disorder, and OCD. Report her psychiatrist Dr. Elouise Rous with [facility] thinks it's her OCD thoughts. Therapist Warren Delaine and eating disorder dietitian. Report when wakes up with sweats and does not know where she it. During writer's encounter with patient, mood is depressed, & is extremely anxious, reports being unsure if I can go on and live like this . Patient is admitted to [facility] unit with Q15 min safety monitoring. Multidisciplinary team approach is offered. Medication management; group/milieu therapy is offered.   Subjective:  Chart reviewed, case discussed in multidisciplinary meeting, patient is noted to be resting in her room.  She reports that she made phone calls to one of the residential facility for OCD in [place]  and they are going to email her the application.  She also identified one of [name] as her safe person who will be staying with her once she gets discharged home.  Requested LCSW to reach out to [facility] and confirm the postdischarge planning.  Patient did confirm no firearms in the house.  She is taking her medications with no reported side effects.  Patient talks about the anxiety about feeling her mind is zapping.  Provider educated her that her vitals consistently remained stable and that the thought process and intrusive thoughts about anxious being anxious and derealization will be addressed through exposure response prevention therapy and EMDR.  Patient denies SI/HI/plan denies hallucinations.   Sleep: Good  Appetite:  Good  Past Psychiatric History: see h&P Family History:  Family History  Problem Relation Age of Onset   Polymyalgia rheumatica Mother     Osteoporosis Mother    Cancer Father    Heart disease Father        CHF   Diabetes Sister        Obesity   Stomach cancer Maternal Grandfather    Colon cancer Neg Hx    Esophageal cancer Neg Hx    Rectal cancer Neg Hx    Social History:  Social History   Substance and Sexual Activity  Alcohol Use No     Social History   Substance and Sexual Activity  Drug Use No    Social History   Socioeconomic History   Marital status: Divorced    Spouse name: Not on file   Number of children: 1   Years of education: Not on file   Highest education level: Not on file  Occupational History   Occupation: Scientist, product/process development education    Employer: [employer]   Occupation: Consulting civil engineer   Occupation: Restaurant manager, fast food   Occupation: Architect    Comment: for person's with Parkinson's Disease  Tobacco Use   Smoking status: Never   Smokeless tobacco: Never  Vaping Use   Vaping status: Never Used  Substance and Sexual Activity   Alcohol use: No   Drug use: No   Sexual activity: Not on file  Other Topics Concern   Not on file  Social History Narrative   ** Merged History Encounter **       Social Drivers of Health   Financial Resource Strain: Low Risk  (04/19/2023)   Received from [facility]   Overall Financial Resource Strain (CARDIA)    Difficulty of  Paying Living Expenses: Not hard at all  Food Insecurity: No Food Insecurity (08/20/2023)   Hunger Vital Sign    Worried About Running Out of Food in the Last Year: Never true    Ran Out of Food in the Last Year: Never true  Transportation Needs: No Transportation Needs (08/20/2023)   PRAPARE - Administrator, Civil Service (Medical): No    Lack of Transportation (Non-Medical): No  Physical Activity: Sufficiently Active (03/12/2023)   Received from [facility]   Exercise Vital Sign    On average, how many days per week do you engage in moderate to strenuous exercise (like a brisk walk)?: 7 days    On average,  how many minutes do you engage in exercise at this level?: 60 min  Stress: Stress Concern Present (03/12/2023)   Received from [facility] - Occupational Stress Questionnaire    Feeling of Stress : Very much  Social Connections: Unknown (08/21/2023)   Social Connection and Isolation Panel    Frequency of Communication with Friends and Family: Three times a week    Frequency of Social Gatherings with Friends and Family: Once a week    Attends Religious Services: 1 to 4 times per year    Active Member of [employer] or Organizations: Yes    Attends Banker Meetings: 1 to 4 times per year    Marital Status: Patient declined   Past Medical History:  Past Medical History:  Diagnosis Date   Acute pain of right knee 03/07/2017   Anemia    Anorexia    Anxiety    Bitemporal hemianopsia    Borderline personality disorder (HCC)    Bulimia    Depression    Hyperlipidemia    Ocular migraine    Osteoporosis    Pigmentary retinal dystrophy    Retinitis pigmentosa    TBI (traumatic brain injury) (HCC)     Past Surgical History:  Procedure Laterality Date   CESAREAN SECTION      Current Medications: Current Facility-Administered Medications  Medication Dose Route Frequency Provider Last Rate Last Admin   acetaminophen  (TYLENOL ) tablet 650 mg  650 mg Oral Q6H PRN Nkwenti, Doris, NP       alum & mag hydroxide-simeth (MAALOX/MYLANTA) 200-200-20 MG/5ML suspension 30 mL  30 mL Oral Q4H PRN Nkwenti, Doris, NP   30 mL at 08/28/23 1813   clonazePAM  (KLONOPIN ) disintegrating tablet 0.25 mg  0.25 mg Oral BID Dail Rankin RAMAN, RPH   0.25 mg at 08/29/23 9078   hydrOXYzine  (ATARAX ) tablet 50 mg  50 mg Oral Q6H PRN Shelva Hetzer, MD   50 mg at 08/28/23 2159   magnesium  hydroxide (MILK OF MAGNESIA) suspension 30 mL  30 mL Oral Daily PRN Tex Drilling, NP       OLANZapine  zydis (ZYPREXA ) disintegrating tablet 5 mg  5 mg Oral TID PRN Tex Drilling, NP        pantoprazole  (PROTONIX ) EC tablet 40 mg  40 mg Oral Daily Nkwenti, Doris, NP   40 mg at 08/29/23 0921   polyethylene glycol (MIRALAX  / GLYCOLAX ) packet 17 g  17 g Oral Daily PRN Tex Drilling, NP   17 g at 08/28/23 0818   QUEtiapine  (SEROQUEL ) tablet 25 mg  25 mg Oral TID PRN Maisy Newport, MD   25 mg at 08/27/23 1502   QUEtiapine  (SEROQUEL ) tablet 50 mg  50 mg Oral QHS Duaine Radin, MD   50 mg  at 08/28/23 2213   rosuvastatin  (CRESTOR ) tablet 20 mg  20 mg Oral Daily Alexyss Balzarini, MD   20 mg at 08/29/23 9077   sertraline  (ZOLOFT ) tablet 100 mg  100 mg Oral Daily [place], MD   100 mg at 08/29/23 9071   Vitamin D  (Ergocalciferol ) (DRISDOL ) 1.25 MG (50000 UNIT) capsule 50,000 Units  50,000 Units Oral Q7 days Tex Drilling, NP   50,000 Units at 08/28/23 9097    Lab Results: No results found for this or any previous visit (from the past 48 hours).  Blood Alcohol level:  Lab Results  Component Value Date   [facility] <15 08/20/2023    Metabolic Disorder Labs: Lab Results  Component Value Date   HGBA1C 4.9 08/20/2023   MPG 93.93 08/20/2023   Lab Results  Component Value Date   PROLACTIN 5.7 08/12/2013   Lab Results  Component Value Date   CHOL 152 08/20/2023   TRIG 76 08/20/2023   HDL 68 08/20/2023   CHOLHDL 2.2 08/20/2023   VLDL 15 08/20/2023   LDLCALC 69 08/20/2023    Physical Findings: AIMS:  , ,  ,  ,    CIWA:    COWS:      Psychiatric Specialty Exam:  Presentation  General Appearance:  Appropriate for Environment  Eye Contact: Good  Speech: Normal Rate  Speech Volume: Normal    Mood and Affect  Mood: Anxious  Affect: Tearful   Thought Process  Thought Processes: Coherent  Descriptions of Associations:Intact  Orientation:Full (Time, Place and Person)  Thought Content:Obsessions  Hallucinations: Denies  Ideas of Reference:None  Suicidal Thoughts: Denies  Homicidal Thoughts: Denies   Sensorium  Memory: Immediate Good;  Recent Good; Remote Good  Judgment: Fair  Insight: Fair   Chartered certified accountant: Fair  Attention Span: Fair  Recall: Good  Fund of Knowledge: Good  Language: Good   Psychomotor Activity  Psychomotor Activity: No data recorded  Musculoskeletal: Strength & Muscle Tone: within normal limits Gait & Station: normal Assets  Assets: Desire for Improvement; Social Support; Resilience; Housing; Talents/Skills; Financial Resources/Insurance; Communication Skills    Physical Exam: Physical Exam Vitals and nursing note reviewed.    ROS Blood pressure 118/72, pulse 80, temperature (!) 97.4 F (36.3 C), resp. rate 18, height 5' 3 (1.6 m), weight 61.7 kg, last menstrual period 02/18/2004, SpO2 96%. Body mass index is 24.09 kg/m.  Diagnosis: Principal Problem:   OCD (obsessive compulsive disorder)   PLAN: Safety and Monitoring:  -- Voluntary admission to inpatient psychiatric unit for safety, stabilization and treatment  -- Daily contact with patient to assess and evaluate symptoms and progress in treatment  -- Patient's case to be discussed in multi-disciplinary team meeting  -- Observation Level : q15 minute checks  -- Vital signs:  q12 hours  -- Precautions: suicide, elopement, and assault -- Encouraged patient to participate in unit milieu and in scheduled group therapies  2. Psychiatric Diagnoses and Treatment:  Will continue sertraline  100 mg.  Patient continues to refuse to take Risperdal  due to reported minimal benefit, discontinued on 08/27/2023 and restarted Seroquel  50 mg nightly and added Seroquel  25 mg every 8 hours as needed for anxiety Will continue with current dose of clonazepam  and continue taper as discussed.  Documentation of meal intake would be helpful.      3. Medical Issues Being Addressed:  None   4. Discharge Planning:   -- Social work and case management to assist with discharge planning and identification of hospital  follow-up needs prior to discharge  -- Estimated LOS: 3-4 days  Zorana Brockwell, MD 08/29/2023, 4:17 PM

## 2023-08-29 NOTE — Group Note (Signed)
 Date:  08/29/2023 Time:  10:48 PM  Group Topic/Focus:  Wrap-Up Group:   The focus of this group is to help patients review their daily goal of treatment and discuss progress on daily workbooks.    Participation Level:  Active  Participation Quality:  Appropriate  Affect:  Appropriate  Cognitive:  Appropriate  Insight: Appropriate  Engagement in Group:  Engaged  Modes of Intervention:  Discussion  Additional Comments:    [name] 08/29/2023, 10:48 PM

## 2023-08-29 NOTE — Group Note (Signed)
 Date:  08/29/2023 Time:  4:14 PM  Group Topic/Focus:  Self Esteem Action Plan:   The focus of this group is to help patients create a plan to continue to build self-esteem after discharge.    Participation Level:  Active  Participation Quality:  Appropriate  Affect:  Appropriate  Cognitive:  Appropriate  Insight: Appropriate  Engagement in Group:  Engaged  Modes of Intervention:  Activity   Arland Nutting 08/29/2023, 4:14 PM

## 2023-08-30 DIAGNOSIS — F422 Mixed obsessional thoughts and acts: Secondary | ICD-10-CM | POA: Diagnosis not present

## 2023-08-30 MED ORDER — PANTOPRAZOLE SODIUM 40 MG PO TBEC: 40.0000 mg | DELAYED_RELEASE_TABLET | Freq: Every day | ORAL | 0 refills | Status: AC

## 2023-08-30 MED ORDER — QUETIAPINE FUMARATE 25 MG PO TABS: 25.0000 mg | ORAL_TABLET | Freq: Three times a day (TID) | ORAL | 0 refills | Status: AC | PRN

## 2023-08-30 MED ORDER — SERTRALINE HCL 100 MG PO TABS: 100.0000 mg | ORAL_TABLET | Freq: Every day | ORAL | 0 refills | Status: AC

## 2023-08-30 MED ORDER — QUETIAPINE FUMARATE 50 MG PO TABS: 50.0000 mg | ORAL_TABLET | Freq: Every day | ORAL | 0 refills | Status: AC

## 2023-08-30 MED ORDER — ROSUVASTATIN CALCIUM 20 MG PO TABS: 20.0000 mg | ORAL_TABLET | Freq: Every day | ORAL | 0 refills | Status: AC

## 2023-08-30 MED ORDER — VITAMIN D (ERGOCALCIFEROL) 1.25 MG (50000 UNIT) PO CAPS
50000.0000 [IU] | ORAL_CAPSULE | ORAL | 0 refills | Status: AC
Start: 2023-09-04 — End: ?

## 2023-08-30 MED ORDER — CLONAZEPAM 0.25 MG PO TBDP: 0.2500 mg | ORAL_TABLET | Freq: Two times a day (BID) | ORAL | 0 refills | Status: AC

## 2023-08-30 NOTE — Discharge Summary (Signed)
 Physician Discharge Summary Note  Patient:  [name] is an 68 y.o., female MRN:  [id number] DOB:  1955/04/06 Patient phone:  [phone] (home)  Patient address:   [address] [place] [address],    Date of Admission:  08/20/2023 Date of Discharge: 08/30/23  Reason for Admission:  [name] 68 year old female accompanied by [name]) reports for the past 10 days experience anxiety and desocialization, I just don't feel like I am home. History of dx anxiety, eating disorder, and OCD. Report her psychiatrist Dr. Elouise Rous with [facility] thinks it's her OCD thoughts. Therapist Warren Delaine and eating disorder dietitian. Report when wakes up with sweats and does not know where she it. During writer's encounter with patient, mood is depressed, & is extremely anxious, reports being unsure if I can go on and live like this . Patient is admitted to [facility] unit with Q15 min safety monitoring. Multidisciplinary team approach is offered. Medication management; group/milieu therapy is offered.   Principal Problem: OCD (obsessive compulsive disorder) Discharge Diagnoses: Principal Problem:   OCD (obsessive compulsive disorder)   Past Psychiatric History: see h&p  Family Psychiatric  History: see h&p Social History:  Social History   Substance and Sexual Activity  Alcohol Use No     Social History   Substance and Sexual Activity  Drug Use No    Social History   Socioeconomic History   Marital status: Divorced    Spouse name: Not on file   Number of children: 1   Years of education: Not on file   Highest education level: Not on file  Occupational History   Occupation: Scientist, product/process development education    Employer: [employer]   Occupation: Consulting civil engineer   Occupation: Restaurant manager, fast food   Occupation: Architect    Comment: for person's with Parkinson's Disease  Tobacco Use   Smoking status: Never   Smokeless tobacco: Never   Vaping Use   Vaping status: Never Used  Substance and Sexual Activity   Alcohol use: No   Drug use: No   Sexual activity: Not on file  Other Topics Concern   Not on file  Social History Narrative   ** Merged History Encounter **       Social Drivers of Health   Financial Resource Strain: Low Risk  (04/19/2023)   Received from [facility]   Overall Financial Resource Strain (CARDIA)    Difficulty of Paying Living Expenses: Not hard at all  Food Insecurity: No Food Insecurity (08/20/2023)   Hunger Vital Sign    Worried About Running Out of Food in the Last Year: Never true    Ran Out of Food in the Last Year: Never true  Transportation Needs: No Transportation Needs (08/20/2023)   PRAPARE - Administrator, Civil Service (Medical): No    Lack of Transportation (Non-Medical): No  Physical Activity: Sufficiently Active (03/12/2023)   Received from [facility]   Exercise Vital Sign    On average, how many days per week do you engage in moderate to strenuous exercise (like a brisk walk)?: 7 days    On average, how many minutes do you engage in exercise at this level?: 60 min  Stress: Stress Concern Present (03/12/2023)   Received from [facility] - Occupational Stress Questionnaire    Feeling of Stress : Very much  Social Connections: Unknown (08/21/2023)   Social Connection and Isolation Panel  Frequency of Communication with Friends and Family: Three times a week    Frequency of Social Gatherings with Friends and Family: Once a week    Attends Religious Services: 1 to 4 times per year    Active Member of [employer] or Organizations: Yes    Attends Banker Meetings: 1 to 4 times per year    Marital Status: Patient declined   Past Medical History:  Past Medical History:  Diagnosis Date   Acute pain of right knee 03/07/2017   Anemia    Anorexia    Anxiety    Bitemporal hemianopsia    Borderline personality  disorder (HCC)    Bulimia    Depression    Hyperlipidemia    Ocular migraine    Osteoporosis    Pigmentary retinal dystrophy    Retinitis pigmentosa    TBI (traumatic brain injury) (HCC)     Past Surgical History:  Procedure Laterality Date   CESAREAN SECTION     Family History:  Family History  Problem Relation Age of Onset   Polymyalgia rheumatica Mother    Osteoporosis Mother    Cancer Father    Heart disease Father        CHF   Diabetes Sister        Obesity   Stomach cancer Maternal Grandfather    Colon cancer Neg Hx    Esophageal cancer Neg Hx    Rectal cancer Neg Hx     Hospital Course:  [name] 68 year old female accompanied by [name]) reports for the past 10 days experience anxiety and desocialization, I just don't feel like I am home. History of dx anxiety, eating disorder, and OCD. Report her psychiatrist Dr. Elouise Rous with [facility] thinks it's her OCD thoughts. Therapist Warren Delaine and eating disorder dietitian. Report when wakes up with sweats and does not know where she it. During writer's encounter with patient, mood is depressed, & is extremely anxious, reports being unsure if I can go on and live like this . Patient is admitted to [facility] unit with Q15 min safety monitoring. Multidisciplinary team approach is offered. Medication management; group/milieu therapy is offered.  Detailed risk assessment is complete based on clinical exam and individual risk factors and acute suicide risk is low and acute violence risk is low.     On admission patient was noted to be on Klonopin  0.5 mg daily as needed as outpatient physician and her PCP were very consistent in tapering patient off of her oral benzos.  She was on Zoloft  50 mg daily.  She was started on Seroquel  50 mg nightly to help as an adjunct with her anxiety.  She was provided hydroxyzine  as as needed which patient reports of no help.  After discussing with the outpatient  psychiatrist patient was switched to Risperdal  0.25 mg titrated up to 0.5 mg to help as an adjunct with OCD intrusive thoughts.  Patient reports extreme anxiety on risperidone  and remained fixated on being anxious in spite of her physiological response has been normal that includes normal vitals throughout the stay.  Patient remains fixated on feeling derealization but is noted to be getting along with peers, participating in groups and fun activities, not responding to any internal stimuli or not acting or responding to any derealization that she explains to provider only during interviews.  Outpatient provider has extensive experience with this patient who has been resistant to work with residential treatment programs for OCD.  During  the stay on a lot of encouragement patient eventually made some phone calls to the residential facilities and put herself on the wait list.  She maintained safe behaviors throughout the hospitalization.  She consistently denied SI/HI/plan even on the day of discharge and denied any hallucinations.  She remains future oriented as she called her friend Nathanel to come stay with her and her brother was also involved in the treatment team.  Currently, all modifiable risk of harm to self/harm to others have been addressed and patient is no longer appropriate for the acute inpatient setting and is able to continue treatment for mental health needs in the community with the supports as indicated below.  Patient is educated and verbalized understanding of discharge plan of care including medications, follow-up appointments, mental health resources and further crisis services in the community.  Patient is instructed to call 911 or present to the nearest emergency room should s hide biological and any other nodules on he experience any decompensation in mood, disturbance of bowel or return of suicidal/homicidal ideations.  Patient verbalizes understanding of this education and agrees to this  plan of care  Physical Findings: AIMS:  , ,  ,  ,    CIWA:    COWS:        Psychiatric Specialty Exam:  Presentation  General Appearance:  Appropriate for Environment; Casual  Eye Contact: Fair  Speech: Clear and Coherent  Speech Volume: Normal    Mood and Affect  Mood: Anxious  Affect: Appropriate   Thought Process  Thought Processes: Coherent  Descriptions of Associations:Intact  Orientation:Full (Time, Place and Person)  Thought Content:Logical  Hallucinations:Hallucinations: None  Ideas of Reference:None  Suicidal Thoughts:Suicidal Thoughts: No  Homicidal Thoughts:Homicidal Thoughts: No   Sensorium  Memory: Immediate Fair; Recent Fair; Remote Fair  Judgment: Fair  Insight: Fair   Art therapist  Concentration: Fair  Attention Span: Fair  Recall: [employer] of Knowledge: Fair  Language: Fair   Psychomotor Activity  Psychomotor Activity: Psychomotor Activity: Normal  Musculoskeletal: Strength & Muscle Tone: within normal limits Gait & Station: normal Assets  Assets: Manufacturing systems engineer; Desire for Improvement; Resilience; Social Support   Sleep  Sleep: Sleep: Fair    Physical Exam: Physical Exam Vitals and nursing note reviewed.    ROS Blood pressure (!) 102/57, pulse 70, temperature (!) 97.3 F (36.3 C), resp. rate 18, height 5' 3 (1.6 m), weight 61.7 kg, last menstrual period 02/18/2004, SpO2 98%. Body mass index is 24.09 kg/m.   Social History   Tobacco Use  Smoking Status Never  Smokeless Tobacco Never   Tobacco Cessation:  N/A, patient does not currently use tobacco products   Blood Alcohol level:  Lab Results  Component Value Date   [facility] <15 08/20/2023    Metabolic Disorder Labs:  Lab Results  Component Value Date   HGBA1C 4.9 08/20/2023   MPG 93.93 08/20/2023   Lab Results  Component Value Date   PROLACTIN 5.7 08/12/2013   Lab Results  Component Value Date   CHOL  152 08/20/2023   TRIG 76 08/20/2023   HDL 68 08/20/2023   CHOLHDL 2.2 08/20/2023   VLDL 15 08/20/2023   LDLCALC 69 08/20/2023    See Psychiatric Specialty Exam and Suicide Risk Assessment completed by Attending Physician prior to discharge.  Discharge destination:  Home  Is patient on multiple antipsychotic therapies at discharge:  No   Has Patient had three or more failed trials of antipsychotic monotherapy by history:  No  Recommended Plan for Multiple Antipsychotic Therapies: NA  Discharge Instructions     Diet - low sodium heart healthy   Complete by: As directed    Increase activity slowly   Complete by: As directed       Allergies as of 08/30/2023       Reactions   Carbamazepine Palpitations   Prochlorperazine Edisylate    Muscle tremors Other reaction(s): Other   Keflex [cephalexin] Rash, Other (See Comments)   Other: thrush in mouth   Augmentin [amoxicillin-pot Clavulanate] Diarrhea   Prochlorperazine Other (See Comments)   Epinephrine  Palpitations, Other (See Comments)        Medication List     STOP taking these medications    clonazePAM  0.5 MG tablet Commonly known as: KLONOPIN  Replaced by: clonazePAM  0.25 MG disintegrating tablet   PARoxetine 30 MG tablet Commonly known as: PAXIL       TAKE these medications      Indication  Calcium  200 MG Tabs    clonazePAM  0.25 MG disintegrating tablet Commonly known as: KLONOPIN  Take 1 tablet (0.25 mg total) by mouth 2 (two) times daily. Replaces: clonazePAM  0.5 MG tablet    famotidine  20 MG tablet Commonly known as: PEPCID  Take 20 mg by mouth 2 (two) times daily.    ondansetron  4 MG disintegrating tablet Commonly known as: ZOFRAN -ODT Take 4 mg by mouth every 8 (eight) hours as needed.    pantoprazole  40 MG tablet Commonly known as: PROTONIX  Take 1 tablet (40 mg total) by mouth 2 (two) times daily. What changed: Another medication with the same name was added. Make sure you understand how  and when to take each.    pantoprazole  40 MG tablet Commonly known as: PROTONIX  Take by mouth. What changed: Another medication with the same name was added. Make sure you understand how and when to take each.    pantoprazole  40 MG tablet Commonly known as: PROTONIX  Take 1 tablet (40 mg total) by mouth daily. Start taking on: August 31, 2023 What changed: You were already taking a medication with the same name, and this prescription was added. Make sure you understand how and when to take each.  Indication: Gastroesophageal Reflux Disease   Plecanatide 3 MG Tabs Take by mouth.    polyethylene glycol 17 g packet Commonly known as: MIRALAX  / GLYCOLAX  as needed.    QUEtiapine  25 MG tablet Commonly known as: SEROQUEL  Take 1 tablet (25 mg total) by mouth 3 (three) times daily as needed (Anxiety). What changed:  how much to take when to take this reasons to take this    QUEtiapine  50 MG tablet Commonly known as: SEROQUEL  Take 1 tablet (50 mg total) by mouth at bedtime. What changed: You were already taking a medication with the same name, and this prescription was added. Make sure you understand how and when to take each.    rosuvastatin  20 MG tablet Commonly known as: CRESTOR  Take 1 tablet (20 mg total) by mouth daily. Start taking on: August 31, 2023 What changed:  how much to take when to take this    sertraline  100 MG tablet Commonly known as: ZOLOFT  Take 1 tablet (100 mg total) by mouth daily. Start taking on: August 31, 2023    sucralfate  1 GM/10ML suspension Commonly known as: CARAFATE  Take by mouth.    Vitamin D  (Ergocalciferol ) 1.25 MG (50000 UNIT) Caps capsule Commonly known as: DRISDOL  Take 1 capsule (50,000 Units total) by mouth every 7 (seven) days. Start taking on: September 04, 2023 What changed:  how much to take when to take this         Follow-up Information     [facility]. Go on 09/06/2023.   Why: Your appointment is scheduled for  09/06/23 at 1:30 PM. As discussed, if an earlier appointment becomes available you will be informed by your outpatient office. Contact information: Monday - Friday 8:00am - 5:00pm EST  [phone] Call or Text  [email or web address]                Follow-up recommendations:  Activity:  As tolerated    Signed: Clarke Amburn, MD 08/30/2023, 11:48 AM

## 2023-08-30 NOTE — Care Management Important Message (Signed)
 Important Message  Patient Details  Name: [name] MRN: [id number] Date of Birth: Mar 22, 1955   Important Message Given:  Yes - Medicare IM     Lum [name], LCSWA 08/30/2023, 9:52 AM

## 2023-08-30 NOTE — Plan of Care (Signed)

## 2023-08-30 NOTE — Group Note (Signed)
 Date:  08/30/2023 Time:  11:01 AM  Group Topic/Focus:  Healthy Communication:   The focus of this group is to discuss communication, barriers to communication, as well as healthy ways to communicate with others.    Participation Level:  Did Not Attend   Arland Nutting 08/30/2023, 11:01 AM

## 2023-08-30 NOTE — Progress Notes (Signed)
 Pt is alert and oriented. Flat affect. Endorses anxiety 10/10. PRN given po given for anxiety. Partially participated attend group. Interacting with peers and staff. No behavior issues noted. Denies SI/HIAVH. No c/o pain/discomfort noted.     08/29/23 2100  Psych Admission Type (Psych Patients Only)  Admission Status Voluntary  Psychosocial Assessment  Patient Complaints Anxiety  Eye Contact Fair  Facial Expression Anxious  Affect Anxious  Speech Logical/coherent  Interaction Assertive  Motor Activity Restless  Appearance/Hygiene Unremarkable  Behavior Characteristics Cooperative  Mood Pleasant  Thought Process  Coherency WDL  Content WDL  Delusions None reported or observed  Perception WDL  Hallucination None reported or observed  Judgment Impaired  Confusion Mild  Danger to Self  Current suicidal ideation? Denies

## 2023-08-30 NOTE — BHH Counselor (Signed)
 CSW contacted Kashina Mecum [phone]), pt's brother per provider's request.   Present during the call was Dr.Jadapalle and PA-S Julia W.  Provider gave updates regarding pt's toleration of medication and plan to taper pt off of Klonopin  through her outpatient provider.   Provider gave education around pt's medication and diagnosis.   Pt's brother agreeable to discharge of pt. Provider addressed all concerns presented and pt will discharge with wrap-around services.   Lum Croft, MSW, [place] 08/30/2023 10:11 AM

## 2023-08-30 NOTE — Progress Notes (Signed)
  [facility] Adult Case Management Discharge Plan :  Will you be returning to the same living situation after discharge:  Yes,  pt will return home. Pt's friend Nathanel will be staying with her following discharge  At discharge, do you have transportation home?: Yes,  pt's friend Nathanel will pick her up  Do you have the ability to pay for your medications: Yes,  AETNA MEDICARE / AETNA MEDICARE HMO/PPO  Release of information consent forms completed and in the chart;  Patient's signature needed at discharge.  Patient to Follow up at:  Follow-up Information     [facility]. Go on 09/06/2023.   Why: Your appointment is scheduled for 09/06/23 at 1:30 PM. As discussed, if an earlier appointment becomes available you will be informed by your outpatient office. Contact information: Monday - Friday 8:00am - 5:00pm EST  [phone] Call or Text  [email or web address]                Next level of care provider has access to [facility] Link:no  Safety Planning and Suicide Prevention discussed: Yes,  Ahana Najera, patient's brother, [phone], (name of family member/significant other)     Has patient been referred to the Quitline?: Patient does not use tobacco/nicotine products  Patient has been referred for addiction treatment: No known substance use disorder.  [address], LCSWA 08/30/2023, 9:49 AM

## 2023-08-30 NOTE — BHH Suicide Risk Assessment (Signed)
 [facility] Discharge Suicide Risk Assessment   Principal Problem: OCD (obsessive compulsive disorder) Discharge Diagnoses: Principal Problem:   OCD (obsessive compulsive disorder)   Total Time spent with patient: 30 minutes  Musculoskeletal: Strength & Muscle Tone: within normal limits Gait & Station: normal Patient leans: N/A  Psychiatric Specialty Exam  Presentation  General Appearance:  Appropriate for Environment; Casual  Eye Contact: Fair  Speech: Clear and Coherent  Speech Volume: Normal  Handedness: Right   Mood and Affect  Mood: Anxious  Duration of Depression Symptoms: No data recorded Affect: Appropriate   Thought Process  Thought Processes: Coherent  Descriptions of Associations:Intact  Orientation:Full (Time, Place and Person)  Thought Content:Logical  History of Schizophrenia/Schizoaffective disorder:No data recorded Duration of Psychotic Symptoms:No data recorded Hallucinations:Hallucinations: None  Ideas of Reference:None  Suicidal Thoughts:Suicidal Thoughts: No  Homicidal Thoughts:Homicidal Thoughts: No   Sensorium  Memory: Immediate Fair; Recent Fair; Remote Fair  Judgment: Fair  Insight: Fair   Art therapist  Concentration: Fair  Attention Span: Fair  Recall: [employer] of Knowledge: Fair  Language: Fair   Psychomotor Activity  Psychomotor Activity: Psychomotor Activity: Normal   Assets  Assets: Communication Skills; Desire for Improvement; Resilience; Social Support   Sleep  Sleep: Sleep: Fair  Estimated Sleeping Duration (Last 24 Hours): 6.50-7.75 hours  Physical Exam: Physical Exam ROS Blood pressure (!) 102/57, pulse 70, temperature (!) 97.3 F (36.3 C), resp. rate 18, height 5' 3 (1.6 m), weight 61.7 kg, last menstrual period 02/18/2004, SpO2 98%. Body mass index is 24.09 kg/m.  Mental Status Per Nursing Assessment::   On Admission:  NA  Demographic Factors:   Caucasian  Loss Factors: Decrease in vocational status  Historical Factors: Impulsivity  Risk Reduction Factors:   Positive social support, Positive therapeutic relationship, and Positive coping skills or problem solving skills  Continued Clinical Symptoms:  Severe Anxiety and/or Agitation  Cognitive Features That Contribute To Risk:  None    Suicide Risk:  Minimal: No identifiable suicidal ideation.  Patients presenting with no risk factors but with morbid ruminations; may be classified as minimal risk based on the severity of the depressive symptoms   Follow-up Information     [facility]. Go on 09/06/2023.   Why: Your appointment is scheduled for 09/06/23 at 1:30 PM. As discussed, if an earlier appointment becomes available you will be informed by your outpatient office. Contact information: Monday - Friday 8:00am - 5:00pm EST  [phone] Call or Text  [email or web address]                Plan Of Care/Follow-up recommendations:  Activity:  As tolerated  Allyn Foil, MD 08/30/2023, 11:46 AM

## 2023-12-17 ENCOUNTER — Emergency Department (HOSPITAL_BASED_OUTPATIENT_CLINIC_OR_DEPARTMENT_OTHER)
Admission: EM | Admit: 2023-12-17 | Discharge: 2023-12-18 | Disposition: A | Discharge status: Home / Self Care | Attending: Emergency Medicine | Admitting: Emergency Medicine

## 2023-12-17 ENCOUNTER — Other Ambulatory Visit: Payer: Self-pay

## 2023-12-17 ENCOUNTER — Emergency Department (HOSPITAL_BASED_OUTPATIENT_CLINIC_OR_DEPARTMENT_OTHER)

## 2023-12-17 ENCOUNTER — Encounter (HOSPITAL_BASED_OUTPATIENT_CLINIC_OR_DEPARTMENT_OTHER): Payer: Self-pay

## 2023-12-17 ENCOUNTER — Telehealth: Admitting: Physician Assistant

## 2023-12-17 ENCOUNTER — Emergency Department (HOSPITAL_BASED_OUTPATIENT_CLINIC_OR_DEPARTMENT_OTHER): Admitting: Radiology

## 2023-12-17 DIAGNOSIS — H53489 Generalized contraction of visual field, unspecified eye: Secondary | ICD-10-CM

## 2023-12-17 DIAGNOSIS — H938X3 Other specified disorders of ear, bilateral: Secondary | ICD-10-CM | POA: Insufficient documentation

## 2023-12-17 DIAGNOSIS — H9319 Tinnitus, unspecified ear: Secondary | ICD-10-CM

## 2023-12-17 DIAGNOSIS — H538 Other visual disturbances: Secondary | ICD-10-CM | POA: Diagnosis not present

## 2023-12-17 DIAGNOSIS — R0602 Shortness of breath: Secondary | ICD-10-CM

## 2023-12-17 DIAGNOSIS — R0609 Other forms of dyspnea: Secondary | ICD-10-CM

## 2023-12-17 NOTE — Patient Instructions (Signed)
 Almarie Jointer, thank you for joining Teena Shuck, PA-C for today's virtual visit.  While this provider is not your primary care provider (PCP), if your PCP is located in our provider database this encounter information will be shared with them immediately following your visit.   A [facility] MyChart account gives you access to today's visit and all your visits, tests, and labs performed at [facility]  click here if you don't have a [facility] MyChart account or go to [email or web address]  Consent: (Patient) Almarie Jointer provided verbal consent for this virtual visit at the beginning of the encounter.  Current Medications:  Current Outpatient Medications:    Calcium  200 MG TABS, , Disp: , Rfl:    clonazePAM  (KLONOPIN ) 0.25 MG disintegrating tablet, Take 1 tablet (0.25 mg total) by mouth 2 (two) times daily., Disp: 60 tablet, Rfl: 0   famotidine  (PEPCID ) 20 MG tablet, Take 20 mg by mouth 2 (two) times daily., Disp: , Rfl:    ondansetron  (ZOFRAN -ODT) 4 MG disintegrating tablet, Take 4 mg by mouth every 8 (eight) hours as needed., Disp: , Rfl:    pantoprazole  (PROTONIX ) 40 MG tablet, Take 1 tablet (40 mg total) by mouth 2 (two) times daily., Disp: 60 tablet, Rfl: 5   pantoprazole  (PROTONIX ) 40 MG tablet, Take by mouth., Disp: , Rfl:    pantoprazole  (PROTONIX ) 40 MG tablet, Take 1 tablet (40 mg total) by mouth daily., Disp: 30 tablet, Rfl: 0   Plecanatide 3 MG TABS, Take by mouth., Disp: , Rfl:    polyethylene glycol (MIRALAX  / GLYCOLAX ) 17 g packet, as needed., Disp: , Rfl:    QUEtiapine  (SEROQUEL ) 25 MG tablet, Take 1 tablet (25 mg total) by mouth 3 (three) times daily as needed (Anxiety)., Disp: 90 tablet, Rfl: 0   QUEtiapine  (SEROQUEL ) 50 MG tablet, Take 1 tablet (50 mg total) by mouth at bedtime., Disp: 30 tablet, Rfl: 0   rosuvastatin  (CRESTOR ) 20 MG tablet, Take 1 tablet (20 mg total) by mouth daily., Disp: 30 tablet, Rfl: 0   sertraline  (ZOLOFT ) 100 MG tablet,  Take 1 tablet (100 mg total) by mouth daily., Disp: 30 tablet, Rfl: 0   sucralfate  (CARAFATE ) 1 GM/10ML suspension, Take by mouth., Disp: , Rfl:    Vitamin D , Ergocalciferol , (DRISDOL ) 1.25 MG (50000 UNIT) CAPS capsule, Take 1 capsule (50,000 Units total) by mouth every 7 (seven) days., Disp: 5 capsule, Rfl: 0  Current Facility-Administered Medications:    denosumab  (PROLIA ) injection 60 mg, 60 mg, Subcutaneous, Once, Shamleffer, Ibtehal Jaralla, MD   [START ON 12/23/2023] denosumab  (PROLIA ) injection 60 mg, 60 mg, Subcutaneous, Q6 months, Shamleffer, Ibtehal Jaralla, MD   Medications ordered in this encounter:  No orders of the defined types were placed in this encounter.    *If you need refills on other medications prior to your next appointment, please contact your pharmacy*  Follow-Up: Call back or seek an in-person evaluation if the symptoms worsen or if the condition fails to improve as anticipated.  [facility] [phone]  Other Instructions Report to ER.    If you have been instructed to have an in-person evaluation today at a local Urgent Care facility, please use the link below. It will take you to a list of all of our available [facility], including address, phone number and hours of operation. Please do not delay care.  [facility]  If you or a family member do not have a primary care provider, use the  link below to schedule a visit and establish care. When you choose a [facility] primary care physician or advanced practice provider, you gain a long-term partner in health. Find a Primary Care Provider  Learn more about [facility] in-office and virtual care options: [facility] - Get Care Now

## 2023-12-17 NOTE — Progress Notes (Signed)
 Virtual Visit Consent   [name], you are scheduled for a virtual visit with a [facility] provider today. Just as with appointments in the office, your consent must be obtained to participate. Your consent will be active for this visit and any virtual visit you may have with one of our providers in the next 365 days. If you have a MyChart account, a copy of this consent can be sent to you electronically.  As this is a virtual visit, video technology does not allow for your provider to perform a traditional examination. This may limit your provider's ability to fully assess your condition. If your provider identifies any concerns that need to be evaluated in person or the need to arrange testing (such as labs, EKG, etc.), we will make arrangements to do so. Although advances in technology are sophisticated, we cannot ensure that it will always work on either your end or our end. If the connection with a video visit is poor, the visit may have to be switched to a telephone visit. With either a video or telephone visit, we are not always able to ensure that we have a secure connection.  By engaging in this virtual visit, you consent to the provision of healthcare and authorize for your insurance to be billed (if applicable) for the services provided during this visit. Depending on your insurance coverage, you may receive a charge related to this service.  I need to obtain your verbal consent now. Are you willing to proceed with your visit today? [name] has provided verbal consent on 12/17/2023 for a virtual visit (video or telephone). Teena Shuck, [place]  Date: 12/17/2023 5:28 PM   Virtual Visit via Video Note   I, Teena Shuck, connected with  [name]  ([id number], 10-Dec-1955) on 12/17/23 at  5:30 PM EST by a video-enabled telemedicine application and verified that I am speaking with the correct person using two identifiers.  Location: Patient: Virtual Visit Location  Patient: Home Provider: Virtual Visit Location Provider: Home Office   I discussed the limitations of evaluation and management by telemedicine and the availability of in person appointments. The patient expressed understanding and agreed to proceed.    History of Present Illness: [name] is a 68 y.o. who identifies as a female who was assigned female at birth, and is being seen today for palpitations and swishing in her ear. She states she gets tunnel vision and periodically she will become short of breath with a sudden rush. She feels that it is pulsating in her ear. She states she is having difficulty believing this is anxiety.   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   Osteoporosis 09/19/2011   Numbness and tingling 09/19/2011   Weakness generalized 09/19/2011   Retinitis pigmentosa 09/19/2011   Age-related osteoporosis without current pathological fracture 09/19/2011   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    Allergies:  Allergies  Allergen Reactions   Carbamazepine Palpitations   Prochlorperazine Edisylate  Muscle tremors Other reaction(s): Other   Keflex [Cephalexin] Rash and Other (See Comments)    Other: thrush in mouth   Augmentin [Amoxicillin-Pot Clavulanate] Diarrhea   Prochlorperazine Other (See Comments)   Epinephrine  Palpitations and Other (See Comments)   Medications:   Current Outpatient Medications:    Calcium  200 MG TABS, , Disp: , Rfl:    clonazePAM  (KLONOPIN ) 0.25 MG disintegrating tablet, Take 1 tablet (0.25 mg total) by mouth 2 (two) times daily., Disp: 60 tablet, Rfl: 0   famotidine  (PEPCID ) 20 MG tablet, Take 20 mg by mouth 2 (two) times daily., Disp: , Rfl:    ondansetron  (ZOFRAN -ODT) 4 MG disintegrating tablet, Take 4 mg by mouth every 8 (eight) hours as needed., Disp: , Rfl:    pantoprazole  (PROTONIX ) 40 MG tablet, Take 1 tablet (40 mg total) by mouth 2 (two) times daily., Disp: 60 tablet, Rfl: 5   pantoprazole  (PROTONIX ) 40 MG tablet, Take by mouth., Disp: , Rfl:    pantoprazole  (PROTONIX ) 40 MG tablet, Take 1 tablet (40 mg total) by mouth daily., Disp: 30 tablet, Rfl: 0   Plecanatide 3 MG TABS, Take by mouth., Disp: , Rfl:    polyethylene glycol (MIRALAX  / GLYCOLAX ) 17 g packet, as needed., Disp: , Rfl:    QUEtiapine  (SEROQUEL ) 25 MG tablet, Take 1 tablet (25 mg total) by mouth 3 (three) times daily as needed (Anxiety)., Disp: 90 tablet, Rfl: 0   QUEtiapine  (SEROQUEL ) 50 MG tablet, Take 1 tablet (50 mg total) by mouth at bedtime., Disp: 30 tablet, Rfl: 0   rosuvastatin  (CRESTOR ) 20 MG tablet, Take 1 tablet (20 mg total) by mouth daily., Disp: 30 tablet, Rfl: 0   sertraline  (ZOLOFT ) 100 MG tablet, Take 1 tablet (100 mg total) by mouth daily., Disp: 30 tablet, Rfl: 0   sucralfate  (CARAFATE ) 1 GM/10ML suspension, Take by mouth., Disp: , Rfl:    Vitamin D , Ergocalciferol , (DRISDOL ) 1.25 MG (50000 UNIT) CAPS capsule, Take 1 capsule (50,000 Units total) by mouth every 7 (seven) days., Disp: 5 capsule, Rfl: 0  Current Facility-Administered Medications:    denosumab  (PROLIA ) injection 60 mg, 60 mg, Subcutaneous, Once, Shamleffer, Ibtehal Jaralla, MD   [START ON 12/23/2023] denosumab  (PROLIA ) injection 60 mg, 60 mg, Subcutaneous, Q6 months, Shamleffer, Donell Cardinal, MD  Observations/Objective: Patient is well-developed, well-nourished in no acute  distress.  Resting comfortably  at home.  Head is normocephalic, atraumatic.  No labored breathing.  Speech is clear and coherent with logical content.  Patient is alert and oriented at baseline.    Assessment and Plan: 1. Shortness of breath (Primary)  2. Tunnel visual field constriction, unspecified laterality  3. Tinnitus, unspecified laterality  Patient presenting with a complex set of symptoms. She appears stable and neurologically intact at the time of the appointment. I however shared with her that due to her rush and tunnel vision I was concerned that she needed to be evaluated in the ER as soon as possible. She stated she would go tomorrow and I again advised that she go today. All questions and concerns were answered during the visit.   Follow Up Instructions: I discussed the assessment and treatment plan with the patient. The patient was provided an opportunity to ask questions and all were answered. The patient agreed with the plan and demonstrated an understanding of the instructions.  A copy of instructions were sent to the patient via MyChart unless otherwise noted below.     The patient was advised to call back or seek  an in-person evaluation if the symptoms worsen or if the condition fails to improve as anticipated.    Teena Shuck, PA-C

## 2023-12-17 NOTE — ED Triage Notes (Addendum)
 Patient reports a swooshing in her ears. Saw PCP for it but states her ENT appointment is not until December. She also said occasionally she has tunnel vision. She says on exertion she feels short of breath. These have been occurring for a month. She is told by her providers that it is anxiety. Also complains of head tightness.

## 2023-12-18 ENCOUNTER — Other Ambulatory Visit: Payer: Self-pay

## 2023-12-18 LAB — CBC
HCT: 37 % (ref 36.0–46.0)
Hemoglobin: 12.6 g/dL (ref 12.0–15.0)
MCH: 29.8 pg (ref 26.0–34.0)
MCHC: 34.1 g/dL (ref 30.0–36.0)
MCV: 87.5 fL (ref 80.0–100.0)
Platelets: 206 K/uL (ref 150–400)
RBC: 4.23 MIL/uL (ref 3.87–5.11)
RDW: 12.4 % (ref 11.5–15.5)
WBC: 6.9 K/uL (ref 4.0–10.5)
nRBC: 0 % (ref 0.0–0.2)

## 2023-12-18 LAB — TROPONIN T, HIGH SENSITIVITY: Troponin T High Sensitivity: <15 ng/L (ref 0–19)

## 2023-12-18 LAB — BASIC METABOLIC PANEL WITH GFR
Anion gap: 8 (ref 5–15)
BUN: 24 mg/dL — ABNORMAL HIGH (ref 8–23)
CO2: 30 mmol/L (ref 22–32)
Calcium: 9.6 mg/dL (ref 8.9–10.3)
Chloride: 102 mmol/L (ref 98–111)
Creatinine, Ser: 0.73 mg/dL (ref 0.44–1.00)
GFR, Estimated: >60 mL/min (ref >60)
Glucose, Bld: 110 mg/dL — ABNORMAL HIGH (ref 70–99)
Potassium: 3.7 mmol/L (ref 3.5–5.1)
Sodium: 140 mmol/L (ref 135–145)

## 2023-12-18 MED ORDER — AZITHROMYCIN 250 MG PO TABS: ORAL_TABLET | ORAL | 0 refills | Status: AC

## 2023-12-18 NOTE — Discharge Instructions (Addendum)
 You were evaluated in the Emergency Department and after careful evaluation, we did not find any emergent condition requiring admission or further testing in the hospital.  Your exam/testing today is overall reassuring.  Your blood tests were normal.  Recommend continued follow-up with your primary care doctor as well as ENT.  Take the azithromycin  antibiotics as directed.  Please return to the Emergency Department if you experience any worsening of your condition.   Thank you for allowing us  to be a part of your care.

## 2023-12-18 NOTE — ED Provider Notes (Signed)
 [facility] Emergency Department Provider Note MRN:  [id number]  Arrival date & time: 12/18/23     Chief Complaint   Ear Fullness, Visual Field Change, and Shortness of Breath   History of Present Illness   [name] is a 68 y.o. year-old female with a history of borderline personality disorder, OCD presenting to the ED with chief complaint of ear fullness.  Progressive symptoms over the past month.  Whooshing fluidlike sound in both ears becoming louder and more constant during this time.  Review of Systems  A thorough review of systems was obtained and all systems are negative except as noted in the HPI and PMH.   Patient's Health History    Past Medical History:  Diagnosis Date   Acute pain of right knee 03/07/2017   Anemia    Anorexia    Anxiety    Bitemporal hemianopsia    Borderline personality disorder (HCC)    Bulimia (HCC)    Depression    Hyperlipidemia    Ocular migraine    Osteoporosis    Pigmentary retinal dystrophy    Retinitis pigmentosa    TBI (traumatic brain injury) (HCC)     Past Surgical History:  Procedure Laterality Date   CESAREAN SECTION      Family History  Problem Relation Age of Onset   Polymyalgia rheumatica Mother    Osteoporosis Mother    Cancer Father    Heart disease Father        CHF   Diabetes Sister        Obesity   Stomach cancer Maternal Grandfather    Colon cancer Neg Hx    Esophageal cancer Neg Hx    Rectal cancer Neg Hx     Social History   Socioeconomic History   Marital status: Divorced    Spouse name: Not on file   Number of children: 1   Years of education: Not on file   Highest education level: Not on file  Occupational History   Occupation: scientist, product/process development education    Employer: [employer]   Occupation: consulting civil engineer   Occupation: restaurant manager, fast food   Occupation: architect    Comment: for person's with Parkinson's Disease  Tobacco Use   Smoking status: Never    Smokeless tobacco: Never  Vaping Use   Vaping status: Never Used  Substance and Sexual Activity   Alcohol use: No   Drug use: No   Sexual activity: Not on file  Other Topics Concern   Not on file  Social History Narrative   ** Merged History Encounter **       Social Drivers of Health   Financial Resource Strain: Low Risk  (11/28/2023)   Received from [facility]   Overall Financial Resource Strain (CARDIA)    How hard is it for you to pay for the very basics like food, housing, medical care, and heating?: Not hard at all  Food Insecurity: No Food Insecurity (11/28/2023)   Received from [facility]   Hunger Vital Sign    Within the past 12 months, you worried that your food would run out before you got the money to buy more.: Never true    Within the past 12 months, the food you bought just didn't last and you didn't have money to get more.: Never true  Transportation Needs: No Transportation Needs (11/28/2023)   Received from [facility] - Transportation    In the past 12 months, has lack  of transportation kept you from medical appointments or from getting medications?: No    In the past 12 months, has lack of transportation kept you from meetings, work, or from getting things needed for daily living?: No  Physical Activity: Sufficiently Active (11/28/2023)   Received from [facility]   Exercise Vital Sign    On average, how many days per week do you engage in moderate to strenuous exercise (like a brisk walk)?: 6 days    On average, how many minutes do you engage in exercise at this level?: 60 min  Stress: Stress Concern Present (11/28/2023)   Received from [facility] - Occupational Stress Questionnaire    Do you feel stress - tense, restless, nervous, or anxious, or unable to sleep at night because your mind is troubled all the time - these days?: Very much  Social Connections: Somewhat Isolated (11/28/2023)    Received from [facility]   Social Network    How would you rate your social network (family, work, friends)?: Restricted participation with some degree of social isolation  Intimate Partner Violence: Not At Risk (11/28/2023)   Received from [facility]   HITS    Over the last 12 months how often did your partner physically hurt you?: Never    Over the last 12 months how often did your partner insult you or talk down to you?: Never    Over the last 12 months how often did your partner threaten you with physical harm?: Never    Over the last 12 months how often did your partner scream or curse at you?: Never     Physical Exam   Vitals:   12/17/23 1813 12/18/23 0001  BP: 128/66 112/70  Pulse: 71 72  Resp: 16 15  Temp: 97.8 F (36.6 C) 98 F (36.7 C)  SpO2: 98% 98%    CONSTITUTIONAL: Well-appearing, NAD NEURO/PSYCH:  Alert and oriented x 3, normal and symmetric strength and sensation, normal coordination, normal speech EYES:  eyes equal and reactive ENT/NECK:  no LAD, no JVD CARDIO: Regular rate, well-perfused, normal S1 and S2 PULM:  CTAB no wheezing or rhonchi GI/GU:  non-distended, non-tender MSK/SPINE:  No gross deformities, no edema SKIN:  no rash, atraumatic   *Additional and/or pertinent findings included in MDM below  Diagnostic and Interventional Summary    EKG Interpretation Date/Time:  December 18, 2023 at 00:26:36 Ventricular Rate:   65 PR Interval:   147 QRS Duration:   86 QT Interval:   419 QTC Calculation:  436 R Axis:      Text Interpretation: Sinus rhythm       Labs Reviewed  BASIC METABOLIC PANEL WITH GFR - Abnormal; Notable for the following components:      Result Value   Glucose, Bld 110 (*)    BUN 24 (*)    All other components within normal limits  CBC  TROPONIN T, HIGH SENSITIVITY  TROPONIN T, HIGH SENSITIVITY    DG Chest 2 View  Final Result    CT Head Wo Contrast  Final Result      Medications - No data to display    Procedures  /  Critical Care Procedures  ED Course and Medical Decision Making  Initial Impression and Ddx Over a month of whooshing in the ear.  Left TM unable to visualize due to wax buildup, right TM looks to have an effusion which may be contributing to the patient's symptoms.  She has follow-up with ENT.  She does not describe it as pulsatile and she has a normal neurological exam.  Overall doubt emergent CNS process or emergent vascular process.  Her dyspnea exertion is also progressive over the past month, trouble doing her exercises.  Differential diagnosis includes atypical presentation of ACS, anemia, electrolyte disturbance.  Overall doubt PE.  Past medical/surgical history that increases complexity of ED encounter: Anxiety  Interpretation of Diagnostics I personally reviewed the EKG and my interpretation is as follows: Sinus rhythm without concerning ischemic findings  No significant blood count or electrolyte disturbance.  Troponin negative  Patient Reassessment and Ultimate Disposition/Management     Discharge.  She will follow-up with ENT, return precautions discussed.  Patient management required discussion with the following services or consulting groups:  None  Complexity of Problems Addressed Acute illness or injury that poses threat of life of bodily function  Additional Data Reviewed and Analyzed Further history obtained from: Further history from spouse/family member  Additional Factors Impacting ED Encounter Risk Prescriptions  Ozell HERO. Theadore, MD [facility] Emergency Medicine [facility] [email or web address]  Final Clinical Impressions(s) / ED Diagnoses     ICD-10-CM   1. Sensation of fullness in both ears  H93.8X3     2. Dyspnea on exertion  R06.09       ED Discharge Orders          Ordered    azithromycin  (ZITHROMAX ) 250 MG tablet        12/18/23 0115             Discharge Instructions Discussed with and Provided  to Patient:    Discharge Instructions      You were evaluated in the Emergency Department and after careful evaluation, we did not find any emergent condition requiring admission or further testing in the hospital.  Your exam/testing today is overall reassuring.  Your blood tests were normal.  Recommend continued follow-up with your primary care doctor as well as ENT.  Take the azithromycin  antibiotics as directed.  Please return to the Emergency Department if you experience any worsening of your condition.   Thank you for allowing us  to be a part of your care.      Theadore Ozell HERO, MD 12/18/23 [phone]

## 2023-12-19 NOTE — Telephone Encounter (Signed)
 Prolia  VOB initiated via [email or web address]  Next Prolia  inj DUE: 12/28/23

## 2023-12-24 NOTE — Telephone Encounter (Signed)
 [employer] - Prior Authorization REQUIRED for [employer]

## 2023-12-27 NOTE — Telephone Encounter (Signed)
 [employer]  Patient is ready for scheduling on or after 12/28/23  Out-of-pocket cost due at time of visit: $356.87   Primary: [employer]na Medicare Advantage Premier PPO Prolia  co-insurance: 20% (approximately $331.87) Admin fee co-insurance: 20% (approximately $25)  Deductible: does not apply  Prior Auth: APPROVED PA# [id number] Valid: 06/14/23-06/13/24  Secondary: N/A Prolia  co-insurance:  Admin fee co-insurance:  Deductible:  Prior Auth:  PA# Valid:   ** This summary of benefits is an estimation of the patient's out-of-pocket cost. Exact cost may vary based on individual plan coverage.

## 2023-12-27 NOTE — Telephone Encounter (Signed)
 Prior Authorization for PROLIA  on file: PA# [id number] Valid: 06/14/23-06/13/24

## 2023-12-28 ENCOUNTER — Ambulatory Visit

## 2023-12-28 DIAGNOSIS — M810 Age-related osteoporosis without current pathological fracture: Secondary | ICD-10-CM | POA: Diagnosis not present

## 2023-12-28 NOTE — Progress Notes (Signed)
 Patient seen today for prolia  injection.  Injection was given without issue.  Patient is scheduled for follow up and denies any concerns.

## 2023-12-31 NOTE — Telephone Encounter (Signed)
 Last Prolia  inj 12/28/23 Next Prolia  inj due 06/27/24

## 2024-01-03 ENCOUNTER — Other Ambulatory Visit: Payer: Self-pay | Admitting: Obstetrics and Gynecology

## 2024-01-03 DIAGNOSIS — H539 Unspecified visual disturbance: Secondary | ICD-10-CM

## 2024-01-18 ENCOUNTER — Encounter: Payer: Self-pay | Admitting: Obstetrics and Gynecology

## 2024-01-24 ENCOUNTER — Inpatient Hospital Stay
Admission: RE | Admit: 2024-01-24 | Discharge: 2024-01-24 | Attending: Obstetrics and Gynecology | Admitting: Obstetrics and Gynecology

## 2024-01-24 DIAGNOSIS — H539 Unspecified visual disturbance: Secondary | ICD-10-CM

## 2024-01-24 MED ORDER — GADOPICLENOL 0.5 MMOL/ML IV SOLN
6.0000 mL | Freq: Once | INTRAVENOUS | Status: AC | PRN
  Administered 2024-01-24: 6 mL via INTRAVENOUS

## 2024-02-06 ENCOUNTER — Emergency Department (HOSPITAL_BASED_OUTPATIENT_CLINIC_OR_DEPARTMENT_OTHER)

## 2024-02-06 ENCOUNTER — Emergency Department (HOSPITAL_BASED_OUTPATIENT_CLINIC_OR_DEPARTMENT_OTHER)
Admission: EM | Admit: 2024-02-06 | Discharge: 2024-02-06 | Disposition: A | Discharge status: Home / Self Care | Attending: Emergency Medicine | Admitting: Emergency Medicine

## 2024-02-06 ENCOUNTER — Other Ambulatory Visit: Payer: Self-pay

## 2024-02-06 DIAGNOSIS — R300 Dysuria: Secondary | ICD-10-CM | POA: Diagnosis present

## 2024-02-06 DIAGNOSIS — N3001 Acute cystitis with hematuria: Secondary | ICD-10-CM | POA: Diagnosis not present

## 2024-02-06 LAB — COMPREHENSIVE METABOLIC PANEL WITH GFR
ALT: 78 U/L — ABNORMAL HIGH (ref 0–44)
AST: 34 U/L (ref 15–41)
Albumin: 4 g/dL (ref 3.5–5.0)
Alkaline Phosphatase: 139 U/L — ABNORMAL HIGH (ref 38–126)
Anion gap: 10 (ref 5–15)
BUN: 21 mg/dL (ref 8–23)
CO2: 28 mmol/L (ref 22–32)
Calcium: 9.4 mg/dL (ref 8.9–10.3)
Chloride: 102 mmol/L (ref 98–111)
Creatinine, Ser: 0.65 mg/dL (ref 0.44–1.00)
GFR, Estimated: >60 mL/min
Glucose, Bld: 106 mg/dL — ABNORMAL HIGH (ref 70–99)
Potassium: 4 mmol/L (ref 3.5–5.1)
Sodium: 140 mmol/L (ref 135–145)
Total Bilirubin: 0.3 mg/dL (ref 0.0–1.2)
Total Protein: 6.9 g/dL (ref 6.5–8.1)

## 2024-02-06 LAB — URINALYSIS, ROUTINE W REFLEX MICROSCOPIC
Bilirubin Urine: NEGATIVE
Glucose, UA: NEGATIVE mg/dL
Ketones, ur: NEGATIVE mg/dL
Nitrite: NEGATIVE
Specific Gravity, Urine: 1.024 (ref 1.005–1.030)
pH: 5.5 (ref 5.0–8.0)

## 2024-02-06 LAB — CBC WITH DIFFERENTIAL/PLATELET
Abs Immature Granulocytes: 0.02 K/uL (ref 0.00–0.07)
Basophils Absolute: 0 K/uL (ref 0.0–0.1)
Basophils Relative: 0 %
Eosinophils Absolute: 0.1 K/uL (ref 0.0–0.5)
Eosinophils Relative: 1 %
HCT: 32.1 % — ABNORMAL LOW (ref 36.0–46.0)
Hemoglobin: 11.2 g/dL — ABNORMAL LOW (ref 12.0–15.0)
Immature Granulocytes: 0 %
Lymphocytes Relative: 22 %
Lymphs Abs: 1.7 K/uL (ref 0.7–4.0)
MCH: 29.9 pg (ref 26.0–34.0)
MCHC: 34.9 g/dL (ref 30.0–36.0)
MCV: 85.6 fL (ref 80.0–100.0)
Monocytes Absolute: 0.9 K/uL (ref 0.1–1.0)
Monocytes Relative: 11 %
Neutro Abs: 5 K/uL (ref 1.7–7.7)
Neutrophils Relative %: 66 %
Platelets: 206 K/uL (ref 150–400)
RBC: 3.75 MIL/uL — ABNORMAL LOW (ref 3.87–5.11)
RDW: 12.3 % (ref 11.5–15.5)
WBC: 7.7 K/uL (ref 4.0–10.5)
nRBC: 0 % (ref 0.0–0.2)

## 2024-02-06 LAB — LIPASE, BLOOD: Lipase: 39 U/L (ref 11–51)

## 2024-02-06 MED ORDER — CEPHALEXIN 250 MG PO CAPS
500.0000 mg | ORAL_CAPSULE | Freq: Once | ORAL | Status: AC
  Administered 2024-02-06: 500 mg via ORAL
  Filled 2024-02-06: qty 2

## 2024-02-06 MED ORDER — CEPHALEXIN 500 MG PO CAPS: 500.0000 mg | ORAL_CAPSULE | Freq: Four times a day (QID) | ORAL | 0 refills | Status: AC

## 2024-02-06 MED ORDER — CEPHALEXIN 500 MG PO CAPS: 500.0000 mg | ORAL_CAPSULE | Freq: Four times a day (QID) | ORAL | 0 refills | Status: DC

## 2024-02-06 NOTE — ED Triage Notes (Signed)
 Pt caox4 ambulatory reporting she went to UC for UTI s/s on Monday, prescribed abx, was told to go to ED if s/s don't improve.

## 2024-02-06 NOTE — ED Provider Notes (Signed)
 " [place] [facility] AT [facility] Provider Note   CSN: [id number] Arrival date & time: 02/06/24  1228     Patient presents with: Urinary Tract Infection   [name] is a 68 y.o. female.  68 year old female presents to the ED with complaints of dysuria since Saturday.  Patient reports she went to urgent care on Sunday and was given Macrobid.  Patient advises she is starting to feel worse and was advised to go to the emergency room if she experiences any worsening symptoms.  Patient reports that she has some left lower back pain with continued dark urine and some dysuria.  Patient advises she feels more fatigued recently as well.  Has any fevers at home.  Patient has significant history of anemia, hyperlipidemia, anxiety, and anemia.     Prior to Admission medications  Medication Sig Start Date End Date Taking? Authorizing Provider  baclofen (LIORESAL) 10 MG tablet Take 10 mg by mouth 3 (three) times daily. Morning,evening,bedtime 02/04/24 02/14/24 Yes [provider]  meloxicam  (MOBIC ) 7.5 MG tablet Take 7.5-15 mg by mouth daily as needed. 02/04/24  Yes [provider]  nitrofurantoin, macrocrystal-monohydrate, (MACROBID) 100 MG capsule Take 100 mg by mouth 2 (two) times daily. Morning and evening 02/04/24 02/11/24 Yes [provider]  azithromycin  (ZITHROMAX ) 250 MG tablet Take 2 tablets together on the first day, then 1 every day until finished. 12/18/23   Theadore Ozell HERO, MD  Calcium  200 MG TABS     [provider]  cephALEXin  (KEFLEX ) 500 MG capsule Take 1 capsule (500 mg total) by mouth 4 (four) times daily. 02/06/24   Myriam Fonda RAMAN, PA-C  clonazePAM  (KLONOPIN ) 0.25 MG disintegrating tablet Take 1 tablet (0.25 mg total) by mouth 2 (two) times daily. 08/30/23   Jadapalle, Sree, MD  famotidine  (PEPCID ) 20 MG tablet Take 20 mg by mouth 2 (two) times daily. 04/27/23   [provider]  ondansetron  (ZOFRAN -ODT) 4 MG  disintegrating tablet Take 4 mg by mouth every 8 (eight) hours as needed. 05/30/22   [provider]  pantoprazole  (PROTONIX ) 40 MG tablet Take 1 tablet (40 mg total) by mouth 2 (two) times daily. 07/08/21   Beather Delon Gibson, PA  pantoprazole  (PROTONIX ) 40 MG tablet Take by mouth. 10/17/22   [provider]  pantoprazole  (PROTONIX ) 40 MG tablet Take 1 tablet (40 mg total) by mouth daily. 08/31/23   Jadapalle, Sree, MD  Plecanatide 3 MG TABS Take by mouth. 03/26/23   [provider]  polyethylene glycol (MIRALAX  / GLYCOLAX ) 17 g packet as needed. 07/14/20   [provider]  QUEtiapine  (SEROQUEL ) 25 MG tablet Take 1 tablet (25 mg total) by mouth 3 (three) times daily as needed (Anxiety). 08/30/23 09/29/23  Jadapalle, Sree, MD  QUEtiapine  (SEROQUEL ) 50 MG tablet Take 1 tablet (50 mg total) by mouth at bedtime. 08/30/23   Jadapalle, Sree, MD  rosuvastatin  (CRESTOR ) 20 MG tablet Take 1 tablet (20 mg total) by mouth daily. 08/31/23 09/30/23  Jadapalle, Sree, MD  sertraline  (ZOLOFT ) 100 MG tablet Take 1 tablet (100 mg total) by mouth daily. 08/31/23   Donnelly Mellow, MD  sucralfate  (CARAFATE ) 1 GM/10ML suspension Take by mouth. 02/18/21   [provider]  Vitamin D , Ergocalciferol , (DRISDOL ) 1.25 MG (50000 UNIT) CAPS capsule Take 1 capsule (50,000 Units total) by mouth every 7 (seven) days. 09/04/23   Jadapalle, Sree, MD    Allergies: Carbamazepine, Prochlorperazine edisylate, Keflex  [cephalexin ], Augmentin [amoxicillin-pot clavulanate], Prochlorperazine, and Epinephrine   Review of Systems  Updated Vital Signs BP 127/71 (BP Location: Right Arm)   Pulse 72   Temp 98.1 F (36.7 C)   Resp 16   Ht 5' 3 (1.6 m)   Wt 59 kg   LMP 02/18/2004   SpO2 100%   BMI 23.03 kg/m   Physical Exam  (all labs ordered are listed, but only abnormal results are displayed) Labs Reviewed  URINALYSIS, ROUTINE W REFLEX MICROSCOPIC - Abnormal; Notable for the following  components:      Result Value   APPearance HAZY (*)    Hgb urine dipstick LARGE (*)    Protein, ur TRACE (*)    Leukocytes,Ua MODERATE (*)    Bacteria, UA RARE (*)    All other components within normal limits  CBC WITH DIFFERENTIAL/PLATELET - Abnormal; Notable for the following components:   RBC 3.75 (*)    Hemoglobin 11.2 (*)    HCT 32.1 (*)    All other components within normal limits  COMPREHENSIVE METABOLIC PANEL WITH GFR - Abnormal; Notable for the following components:   Glucose, Bld 106 (*)    ALT 78 (*)    Alkaline Phosphatase 139 (*)    All other components within normal limits  URINE CULTURE  LIPASE, BLOOD    EKG: None  Radiology: CT Renal Stone Study Result Date: 02/06/2024 CLINICAL DATA:  Provided history: Abdominal/flank pain, stone suspected EXAM: CT ABDOMEN AND PELVIS WITHOUT CONTRAST TECHNIQUE: Multidetector CT imaging of the abdomen and pelvis was performed following the standard protocol without IV contrast. RADIATION DOSE REDUCTION: This exam was performed according to the departmental dose-optimization program which includes automated exposure control, adjustment of the mA and/or kV according to patient size and/or use of iterative reconstruction technique. COMPARISON:  CT 02/26/2022 FINDINGS: Lower chest: Clear lung bases. Hepatobiliary: No focal liver abnormality is seen. No gallstones, gallbladder wall thickening, or biliary dilatation. Pancreas: No ductal dilatation or inflammation. Spleen: Normal in size without focal abnormality. Adrenals/Urinary Tract: Normal adrenal glands. No hydronephrosis. Punctate nonobstructing stones in the left kidney. Bilateral renal cysts, as well as lesions too small to characterize, stable from prior. No further follow-up imaging is recommended. Decompressed ureters. Unremarkable appearance of the urinary bladder. No bladder wall thickening or stone. Stomach/Bowel: Pre pyloric gastric wall thickening versus nondistention, series 2,  image 28. No small bowel obstruction or inflammation. Normal appendix. Moderate volume of stool throughout the colon. The sigmoid colon is redundant. Vascular/Lymphatic: Normal caliber abdominal aorta. No bulky abdominopelvic adenopathy. Reproductive: Uterus and bilateral adnexa are unremarkable. Other: No free air, free fluid, or intra-abdominal fluid collection. Musculoskeletal: There are no acute or suspicious osseous abnormalities. IMPRESSION: 1. Punctate nonobstructing left renal stones. No hydronephrosis or obstructive uropathy. 2. Pre pyloric gastric wall thickening versus nondistention. Recommend correlation for symptoms of gastritis. 3. Moderate volume of stool throughout the colon, can be seen with constipation. Electronically Signed   By: Andrea Gasman M.D.   On: 02/06/2024 17:11    Procedures   Medications Ordered in the ED  cephALEXin  (KEFLEX ) capsule 500 mg (500 mg Oral Given 02/06/24 1530)    68 y.o. female presents to the ED with complaints of dysuria, malaise, left flank pain the differential diagnosis includes but not limited to UTI, pyelonephritis, kidney stone, gastritis (Ddx)  On arrival pt is nontoxic, vitals unremarkable. Exam significant for mild left flank pain.  No other significant findings on exam.  I ordered medication Keflex  for UTI  Lab Tests:  UA urine culture CMP CBC.  UA concerning for evidence of UTI with dysuria.  Liver enzymes mildly elevated, discussed with patient advised to follow-up with PCP for repeat labs.  Imaging Studies ordered:  I ordered imaging studies which included nonobstructing left renal stones with no hydronephrosis or obstructive uropathy.  Possible gastritis with evidence of gastric wall thickening.  Moderate stool volume throughout the colon consistent with constipation.  ED Course:   Patient sitting comfortably in ED bed in no acute distress nontoxic-appearing.  Patient is concerned antibiotic is not working and she started to feel  worse with left back pain.  Patient does not fit SIRS criteria at this time.  Patient does not have any leukocytosis on CBC and urine is consistent with UTI with dysuria.  It was discussed with patient trialing Keflex  again for UTI treatment.  Patient reports she had thrush in her mouth when she used it over 40 years ago.  Advised that she has not had any reactions to penicillin since or Augmentin.  She did report 1 time she took Augmentin she had some diarrhea.  Patient reports she was okay with trialing Keflex  again.  Signs and symptoms of allergic reaction were discussed with patient and strict return precautions were discussed.  It was advised we would send urine off for culture and change antibiotic if needed.    Patient was discussed with attending and she advised to do CT renal to ensure there was no obstructing stone with left flank pain.  CT renal was ordered with no obstructing stone or nephrosis.  Incidental findings were discussed with patient at bedside.  Strict return precautions were discussed with patient at bedside.  Patient was advised to return to primary care or ED if symptoms are not getting better or she starts to experience high fevers, nausea, vomiting, chest pain, shortness of breath.  Patient agreed to treatment plan and was comfortable discharge at this time.  Portions of this note were generated with Scientist, clinical (histocompatibility and immunogenetics). Dictation errors may occur despite best attempts at proofreading.   Final diagnoses:  Acute cystitis with hematuria    ED Discharge Orders          Ordered    cephALEXin  (KEFLEX ) 500 MG capsule  4 times daily,   Status:  Discontinued        02/06/24 1553    cephALEXin  (KEFLEX ) 500 MG capsule  4 times daily        02/06/24 1554               Myriam Fonda RAMAN, [place] 02/06/24 1751    Armenta Canning, MD 02/06/24 1839  "

## 2024-02-06 NOTE — Discharge Instructions (Addendum)
 Your lab work was concerning for UTI.  All other lab work was reassuring today.  Your liver enzymes were mildly elevated as discussed.  Its advised to follow-up with primary care soon for reevaluation of liver enzymes and follow-up from UTI.  We have given you Keflex  today in the ED and have sent you a prescription to your pharmacy.  Please take it as prescribed and monitor for any signs of allergic reaction including hives, rash ,shortness of breath, or any other concerning symptoms.  If symptoms do not improve in the next 7 days please return to ED for further evaluation.

## 2024-02-08 LAB — URINE CULTURE: Culture: 10000 — AB

## 2024-02-09 ENCOUNTER — Telehealth (HOSPITAL_BASED_OUTPATIENT_CLINIC_OR_DEPARTMENT_OTHER): Payer: Self-pay | Admitting: *Deleted

## 2024-02-09 NOTE — Telephone Encounter (Signed)
 Post ED Visit - Positive Culture Follow-up  Culture report reviewed by antimicrobial stewardship pharmacist: [facility] []  Rankin [place], Pharm.D. []  Venetia Gully, Pharm.D., BCPS AQ-ID []  Garrel Crews, Pharm.D., BCPS []  Almarie Lunger, Pharm.D., BCPS []  [place], [place].D., BCPS, AAHIVP []  Rosaline Bihari, Pharm.D., BCPS, AAHIVP []  Vernell Meier, PharmD, BCPS []  Latanya Hint, PharmD, BCPS []  Donald Medley, PharmD, BCPS []  Rocky Bold, PharmD []  Dorothyann Alert, PharmD, BCPS [x]  Dorn Poot, PharmD  [facility] []  Rosaline Edison, PharmD []  Romona Bliss, PharmD []  Dolphus Roller, PharmD []  Veva Seip, Rph []  Vernell Daunt) Leonce, PharmD []  Eva Allis, PharmD []  Rosaline Millet, PharmD []  Iantha Batch, PharmD []  Arvin Gauss, PharmD []  Wanda Hasting, PharmD []  Ronal Rav, PharmD []  Rocky Slade, PharmD []  Bard Jeans, PharmD   Positive urine culture Treated with Cephalexin / Nitrofurantoin, organism sensitive to the same and no further patient follow-up is required at this time.  [name] 02/09/2024, 11:08 AM

## 2024-05-09 ENCOUNTER — Ambulatory Visit: Admitting: Internal Medicine
